# Patient Record
Sex: Female | Born: 1980
Health system: Southern US, Community
[De-identification: ages and names within clinical notes are randomized; demographics above are authoritative.]

## PROBLEM LIST (undated history)

## (undated) DIAGNOSIS — G4730 Sleep apnea, unspecified: Secondary | ICD-10-CM

## (undated) DIAGNOSIS — J069 Acute upper respiratory infection, unspecified: Secondary | ICD-10-CM

## (undated) DIAGNOSIS — M1990 Unspecified osteoarthritis, unspecified site: Secondary | ICD-10-CM

## (undated) DIAGNOSIS — E119 Type 2 diabetes mellitus without complications: Secondary | ICD-10-CM

## (undated) DIAGNOSIS — F329 Major depressive disorder, single episode, unspecified: Secondary | ICD-10-CM

## (undated) DIAGNOSIS — T7840XA Allergy, unspecified, initial encounter: Secondary | ICD-10-CM

## (undated) DIAGNOSIS — N200 Calculus of kidney: Secondary | ICD-10-CM

## (undated) DIAGNOSIS — K219 Gastro-esophageal reflux disease without esophagitis: Secondary | ICD-10-CM

## (undated) DIAGNOSIS — Z87442 Personal history of urinary calculi: Secondary | ICD-10-CM

## (undated) DIAGNOSIS — F909 Attention-deficit hyperactivity disorder, unspecified type: Secondary | ICD-10-CM

## (undated) DIAGNOSIS — D509 Iron deficiency anemia, unspecified: Secondary | ICD-10-CM

## (undated) DIAGNOSIS — D500 Iron deficiency anemia secondary to blood loss (chronic): Principal | ICD-10-CM

## (undated) DIAGNOSIS — J45909 Unspecified asthma, uncomplicated: Secondary | ICD-10-CM

## (undated) DIAGNOSIS — G43909 Migraine, unspecified, not intractable, without status migrainosus: Secondary | ICD-10-CM

## (undated) DIAGNOSIS — F32A Depression, unspecified: Secondary | ICD-10-CM

## (undated) DIAGNOSIS — D069 Carcinoma in situ of cervix, unspecified: Secondary | ICD-10-CM

## (undated) DIAGNOSIS — N938 Other specified abnormal uterine and vaginal bleeding: Secondary | ICD-10-CM

## (undated) DIAGNOSIS — F419 Anxiety disorder, unspecified: Secondary | ICD-10-CM

## (undated) HISTORY — DX: Migraine, unspecified, not intractable, without status migrainosus: G43.909

## (undated) HISTORY — DX: Depression, unspecified: F32.A

## (undated) HISTORY — PX: COLPOSCOPY: SHX161

## (undated) HISTORY — PX: OTHER SURGICAL HISTORY: SHX169

## (undated) HISTORY — DX: Allergy, unspecified, initial encounter: T78.40XA

## (undated) HISTORY — DX: Unspecified asthma, uncomplicated: J45.909

## (undated) HISTORY — DX: Major depressive disorder, single episode, unspecified: F32.9

## (undated) HISTORY — DX: Carcinoma in situ of cervix, unspecified: D06.9

## (undated) HISTORY — DX: Attention-deficit hyperactivity disorder, unspecified type: F90.9

## (undated) HISTORY — DX: Iron deficiency anemia secondary to blood loss (chronic): D50.0

## (undated) HISTORY — PX: MOUTH SURGERY: SHX715

## (undated) HISTORY — DX: Acute upper respiratory infection, unspecified: J06.9

## (undated) HISTORY — DX: Anxiety disorder, unspecified: F41.9

## (undated) HISTORY — DX: Gastro-esophageal reflux disease without esophagitis: K21.9

## (undated) HISTORY — DX: Calculus of kidney: N20.0

## (undated) HISTORY — DX: Type 2 diabetes mellitus without complications: E11.9

## (undated) HISTORY — DX: Iron deficiency anemia, unspecified: D50.9

## (undated) HISTORY — DX: Unspecified osteoarthritis, unspecified site: M19.90

## (undated) HISTORY — DX: Sleep apnea, unspecified: G47.30

## (undated) HISTORY — DX: Other specified abnormal uterine and vaginal bleeding: N93.8

---
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
## 2004-04-14 HISTORY — PX: KNEE ARTHROCENTESIS: SUR44

## 2008-10-20 ENCOUNTER — Ambulatory Visit: Payer: Self-pay | Admitting: Gynecology

## 2008-10-25 ENCOUNTER — Ambulatory Visit: Payer: Self-pay | Admitting: Gynecology

## 2008-10-31 ENCOUNTER — Ambulatory Visit: Payer: Self-pay | Admitting: Gynecology

## 2008-10-31 ENCOUNTER — Encounter: Payer: Self-pay | Admitting: Gynecology

## 2008-11-12 DIAGNOSIS — D069 Carcinoma in situ of cervix, unspecified: Secondary | ICD-10-CM

## 2008-11-12 HISTORY — DX: Carcinoma in situ of cervix, unspecified: D06.9

## 2008-11-12 HISTORY — PX: CERVICAL BIOPSY  W/ LOOP ELECTRODE EXCISION: SUR135

## 2008-11-17 ENCOUNTER — Ambulatory Visit: Payer: Self-pay | Admitting: Gynecology

## 2008-11-21 ENCOUNTER — Ambulatory Visit: Payer: Self-pay | Admitting: Gynecology

## 2008-11-22 ENCOUNTER — Ambulatory Visit: Payer: Self-pay | Admitting: Gynecology

## 2008-11-22 ENCOUNTER — Encounter: Payer: Self-pay | Admitting: Gynecology

## 2008-11-22 ENCOUNTER — Ambulatory Visit (HOSPITAL_BASED_OUTPATIENT_CLINIC_OR_DEPARTMENT_OTHER): Admission: RE | Admit: 2008-11-22 | Discharge: 2008-11-22 | Payer: Self-pay | Admitting: Gynecology

## 2008-12-01 ENCOUNTER — Ambulatory Visit: Payer: Self-pay | Admitting: Gynecology

## 2009-05-24 ENCOUNTER — Other Ambulatory Visit: Admission: RE | Admit: 2009-05-24 | Discharge: 2009-05-24 | Payer: Self-pay | Admitting: Gynecology

## 2009-05-24 ENCOUNTER — Ambulatory Visit: Payer: Self-pay | Admitting: Gynecology

## 2009-06-04 ENCOUNTER — Ambulatory Visit: Payer: Self-pay | Admitting: Gynecology

## 2009-12-26 ENCOUNTER — Other Ambulatory Visit: Admission: RE | Admit: 2009-12-26 | Discharge: 2009-12-26 | Payer: Self-pay | Admitting: Gynecology

## 2009-12-26 ENCOUNTER — Ambulatory Visit: Payer: Self-pay | Admitting: Gynecology

## 2010-08-27 NOTE — H&P (Signed)
[name], [name]             ACCOUNT NO.:  [id number]   MEDICAL RECORD NO.:  [id number]          PATIENT TYPE:  AMB   LOCATION:  [facility]                         FACILITY:  [facility]   PHYSICIAN:  Timothy P. Fontaine, M.D.DATE OF BIRTH:  07-21-1980   DATE OF ADMISSION:  DATE OF DISCHARGE:                              HISTORY & PHYSICAL   DATE OF SURGERY:  August 11, [facility] 8:30 a.m.   CHIEF COMPLAINT:  High-grade cervical dysplasia, dysfunctional bleeding,  endometrial polyp.   HISTORY OF PRESENT ILLNESS:  A 30 year old G zero with history of Pap  smear in [place] showing ascus, cannot rule out high-grade lesion and  presents for C and D.  She had also been on Seasonique and had continued  bleeding on and off over the past several months.  Relates that she has  always had breakthrough bleeding since menarche with long bouts of  amenorrhea and then breakthrough bleeding.  She has been having spotting  between her periods despite being on the [place].  The patient  underwent a colposcopic evaluation with biopsies showing CIN 1 at 12  o'clock, CIN 2 to CIN 3 at 6 o'clock.  She underwent sonohystogram which  overall appeared to be normal with endometrial sampling showing  fragments of benign endometrial polyp pseudo decidual stroma, benign  endocervical mucosa.  The patient is admitted at this time for LEEP  excisional process, hysteroscopy D and C.   PAST MEDICAL HISTORY:  Significant for depression.   PAST SURGICAL HISTORY:  None.   CURRENT MEDICATIONS:  Include Seasonique, Lamictal, Adderall, Lexapro.   ALLERGIES:  No medications.   REVIEW OF SYSTEMS:  Noncontributory.   FAMILY HISTORY:  Noncontributory.   SOCIAL HISTORY:  Noncontributory.   ADMISSION PHYSICAL EXAM:  Afebrile.  VITAL SIGNS:  Stable.  HEENT:  Normal.  LUNGS:  Clear.  CARDIAC:  Regular rate.  No rubs, murmurs or gallops.  ABDOMINAL:  Exam benign.  PELVIC:  External BUS, vagina  normal.  Cervix normal.  Bimanual uterus  normal size, midline, mobile, nontender.  Adnexa without masses or  tenderness.   ASSESSMENT:  A 30 year old G zero oral contraceptives with history of  dysfunctional bleeding on and off.  Outpatient evaluation included a  normal thyroid panel, normal prolactin, sonohystogram overall was normal  though endometrial sampling showed evidence to suggest endometrial  polyp.  She had ascus Pap smear, a few cells suggestive of a higher  lesion.  Colposcopic biopsies to various areas showed one biopsy showing  CIN 1 and the second showing CIN 2 to CIN 3.  Options for management of  both of these issues were reviewed with the patient and her mother.  As  far as cervical dysplasia, I reviewed the options to include expectant  management with hopeful spontaneous regression although possibility of  progression to more significant lesion to include early invasive  carcinoma versus interventional such as LEEP, laser, cone, cryo.  The  risks, benefits of all these options were reviewed with her and she  ultimately has decided to proceed with LEEP.  The patient understands  that this is  virus related, that I am not removing the virus, that she  has the risk of persistent or recurrent disease in the future.  I also  reviewed the various pathology results to include no dysplasia found,  cut through lesions and all dysplasia excised.  The acute risks of LEEP  were reviewed to include bleeding, infection, damage to surrounding  tissues such as vagina, bladder, rectum requiring future reparative  surgeries and again no guarantees as far as eradication of her  dysplasia.  The long-term issues associated with LEEP were also  discussed, in particular the pregnancy issues and the potential for  infertility or problems maintaining a pregnancy, either with incompetent  cervix, preterm delivery or significant preterm delivery or previable  delivery was all discussed with  her.  Again she understands that we are  not eradicating the virus and that she is at risk for persistent  recurrent dysplasia in the future.  I reviewed what is involved with  hysteroscopy, dilation and curettage, use of the hysteroscope,  resectoscope and dilation and curettage.  The risks of the procedure  were reviewed to include the risks of bleeding, transfusion, infection,  uterine perforation, damage to internal organs including bowel, bladder,  ureters, vessels and nerves necessitating major exploratory reparative  surgeries, future reparative surgeries up to and including ostomy  formation.  The risk of distended media absorption leading to metabolic  complications such as coma and seizures were also reviewed, understood  and accepted.  The patient's questions were answered to her  satisfaction.  She has been bleeding on and off throughout the month.  She is due for a period now, has not started yet but had a negative HCG  serum.      Timothy P. Fontaine, M.D.  Electronically Signed     TPF/MEDQ  D:  11/21/2008  T:  11/21/2008  Job:  [id number]

## 2010-08-27 NOTE — Op Note (Signed)
[name], [name]             ACCOUNT NO.:  [id number]   MEDICAL RECORD NO.:  [id number]          PATIENT TYPE:  AMB   LOCATION:  [facility]                         FACILITY:  [facility]   PHYSICIAN:  Timothy P. Fontaine, M.D.DATE OF BIRTH:  1980/12/08   DATE OF PROCEDURE:  11/22/2008  DATE OF DISCHARGE:                               OPERATIVE REPORT   PREOPERATIVE DIAGNOSES:  1. High-grade squamous intraepithelial lesion of the cervix,      colposcopic biopsy-proven.  2. Dysfunctional uterine bleeding.  3. Endometrial polyp.   POSTOPERATIVE DIAGNOSES:  1. High-grade squamous intraepithelial lesion of the cervix,      colposcopic biopsy-proven.  2. Dysfunctional uterine bleeding.  3. Endometrial polyp.   PROCEDURE:  Loop electrical excision procedure (LEEP cervical excisional  biopsy), hysteroscopy, dilatation and curettage.   SURGEON:  Timothy P. Fontaine, M.D.   ANESTHETIC:  General with intracervical lidocaine/epinephrine injection.   SPECIMEN:  1. LEEP cervical excisional biopsy, cut open at 3 o'clock, pinned,      sent to pathology.  2. Endocervical curettings, sent to pathology.  3. Endometrial curetting, sent to pathology.   COMPLICATIONS:  None.   ESTIMATED BLOOD LOSS:  Minimal.   SORBITOL DISCREPANCY:  Minimal.   FINDINGS:  Examination under anesthesia:  External BUS, vagina normal.  Cervix grossly normal.  Bimanual:  Uterus normal size, midline and  mobile.  Adnexa without masses.  Hysteroscopic:  Fundus normal, anterior-  posterior surfaces grossly normal, lower uterine segment with a small  anterior polypoidal area and a small posterior polypoidal area, both in  the lower uterine segment,  both removed with the sharp curetting.  Right and left tubal ostia visualized.  Cavity otherwise was normal.  Endocervical canal normal.   PROCEDURE:  The patient was taken to the operating room, underwent  general anesthesia, was placed in the dorsal lithotomy position,  received a perineal vaginal preparation with Betadine solution, noting  avoiding cervical abrasions but cleansing with Betadine.  Bladder was  emptied with in-and-out Foley catheterization.  EUA was performed and  the patient draped in the usual fashion.  The cervix was visualized with  a LEEP speculum, was circumferentially injected with  lidocaine/epinephrine mixture, a total of 4 mL. The cervix was then  stained with Lugol's solution and the transformation zone, as well as  the abnormal anterior posterior lip areas, were grossly visualized.  Using the 12 x 20 loupe, 60-watt cutting, 60-watt coagulation, blend 1  current, the LEEP was performed, including the visually abnormal areas.  The specimen was cut at 3 o'clock, pinned open and sent to pathology.   An ECC was then performed and the specimen again was sent to pathology.  Prophylactic coagulation was applied to the base with gross hemostasis  noted.   The anterior lip of the cervix was then grasped with a single-tooth  tenaculum, the cervix gently dilated to admit the diagnostic  hysteroscope and hysteroscopy was performed with findings noted above.  A sharp curettage was performed and the specimen was sent to pathology.  The instruments were then removed.  Hemostasis was visualized.  Prophylactic Monsel was applied  to the cervix.  The patient was placed  in the supine position after having received intraoperative Toradol, was  awakened without difficulty and taken to the recovery room in good  condition, having tolerated the procedure well.      Timothy P. Fontaine, M.D.  Electronically Signed     TPF/MEDQ  D:  11/22/2008  T:  11/22/2008  Job:  [id number]

## 2011-04-28 ENCOUNTER — Encounter: Payer: Self-pay | Admitting: Gynecology

## 2011-04-28 ENCOUNTER — Ambulatory Visit (INDEPENDENT_AMBULATORY_CARE_PROVIDER_SITE_OTHER): Payer: 59 | Admitting: Gynecology

## 2011-04-28 ENCOUNTER — Other Ambulatory Visit (HOSPITAL_COMMUNITY)
Admission: RE | Admit: 2011-04-28 | Discharge: 2011-04-28 | Disposition: A | Discharge status: Home / Self Care | Payer: 59 | Source: Ambulatory Visit | Attending: Gynecology | Admitting: Gynecology

## 2011-04-28 VITALS — BP 120/74 | Ht 64.0 in | Wt 171.0 lb

## 2011-04-28 DIAGNOSIS — Z13220 Encounter for screening for lipoid disorders: Secondary | ICD-10-CM

## 2011-04-28 DIAGNOSIS — Z309 Encounter for contraceptive management, unspecified: Secondary | ICD-10-CM

## 2011-04-28 DIAGNOSIS — Z131 Encounter for screening for diabetes mellitus: Secondary | ICD-10-CM

## 2011-04-28 DIAGNOSIS — Z01419 Encounter for gynecological examination (general) (routine) without abnormal findings: Secondary | ICD-10-CM

## 2011-04-28 LAB — URINALYSIS W MICROSCOPIC + REFLEX CULTURE
Bacteria, UA: NEGATIVE
Bilirubin Urine: NEGATIVE
Crystals: NONE SEEN
Ketones, ur: NEGATIVE mg/dL
Nitrite: NEGATIVE
Protein, ur: NEGATIVE mg/dL
Specific Gravity, Urine: 1.02 (ref 1.005–1.030)
Urobilinogen, UA: 0.2 mg/dL (ref 0.0–1.0)

## 2011-04-28 LAB — LIPID PANEL
Cholesterol: 138 mg/dL (ref 0–200)
LDL Cholesterol: 74 mg/dL (ref 0–99)
Total CHOL/HDL Ratio: 3.1 Ratio
Triglycerides: 101 mg/dL (ref <150)
VLDL: 20 mg/dL (ref 0–40)

## 2011-04-28 LAB — CBC WITH DIFFERENTIAL/PLATELET
Eosinophils Relative: 1 % (ref 0–5)
HCT: 36.4 % (ref 36.0–46.0)
Hemoglobin: 12.1 g/dL (ref 12.0–15.0)
Lymphocytes Relative: 32 % (ref 12–46)
MCV: 85.6 fL (ref 78.0–100.0)
Monocytes Absolute: 0.5 10*3/uL (ref 0.1–1.0)
Monocytes Relative: 6 % (ref 3–12)
Neutro Abs: 5.1 10*3/uL (ref 1.7–7.7)
WBC: 8.3 10*3/uL (ref 4.0–10.5)

## 2011-04-28 MED ORDER — LEVONORGEST-ETH ESTRAD 91-DAY 0.15-0.03 MG PO TABS: 1.0000 | ORAL_TABLET | Freq: Every day | ORAL | Status: DC

## 2011-04-28 NOTE — Patient Instructions (Addendum)
Start on birth control pills as we discussed, call if any issues.  Assuming this Pap smear returns normal then recommend repeat Pap smear in 6 months given history of atypia in the past.

## 2011-04-28 NOTE — Progress Notes (Addendum)
[name] January 29, 1981 [id number]        31 y.o.  for annual exam.  Had been on oral contraceptives but ran out earlier this year due to lack of insurance. She had regular menses through December. She then had an early menses in January but notes that she was going through a lot of stress at that time.  Past medical history,surgical history, medications, allergies, family history and social history were all reviewed and documented in the EPIC chart. ROS:  Was performed and pertinent positives and negatives are included in the history.  Exam: Sherri chaperone present Filed Vitals:   04/28/11 1511  BP: 120/74   General appearance  Normal Skin grossly normal Head/Neck normal with no cervical or supraclavicular adenopathy thyroid normal Lungs  clear Cardiac RR, without RMG Abdominal  soft, nontender, without masses, organomegaly or hernia Breasts  examined lying and sitting without masses, retractions, discharge or axillary adenopathy. Pelvic  Ext/BUS/vagina  normal   Cervix  normal  Pap done  Uterus  anteverted, normal size, shape and contour, midline and mobile nontender   Adnexa  Without masses or tenderness    Anus and perineum  normal   Rectovaginal  normal sphincter tone without palpated masses or tenderness.    Assessment/Plan:  31 y.o. female for annual exam.    1. History of significant dysplasia status post LEEP August 2010 showing high-grade squamous dysplasia margins free. Pap done today and I recommended we repeat Pap in 6 months because of her history of significant dysplasia and had only having a few normal Pap smears since then. 2. Contraceptive management. Patient I discussed all options for contraception to include pill patch ring, Depo-Provera, Implanon, IUD. Patient wants to try the extended pills to have less frequent menses I prescribe seasonal x1 year. She'll start after her next menses. She has any issues when starting that she let me know. 3. Irregular menses  x1. She's been going to a lot of stress. She will go ahead and start the birth control pills with her next menses. If she continues accurate or bleeding she'll represent for further evaluation. 4. STD screening. I offered to STD screening and she declined. 5. Health maintenance. SBE monthly reviewed. Stop smoking discussed. Will check baseline CBC lipid profile urinalysis and glucose.    Dara Lords MD, 4:51 PM 04/28/2011

## 2011-04-29 DIAGNOSIS — F32A Depression, unspecified: Secondary | ICD-10-CM | POA: Insufficient documentation

## 2011-04-29 DIAGNOSIS — IMO0002 Reserved for concepts with insufficient information to code with codable children: Secondary | ICD-10-CM | POA: Insufficient documentation

## 2011-04-29 DIAGNOSIS — F329 Major depressive disorder, single episode, unspecified: Secondary | ICD-10-CM | POA: Insufficient documentation

## 2011-05-02 ENCOUNTER — Other Ambulatory Visit: Payer: Self-pay | Admitting: *Deleted

## 2011-05-02 DIAGNOSIS — R718 Other abnormality of red blood cells: Secondary | ICD-10-CM

## 2011-05-05 ENCOUNTER — Other Ambulatory Visit (HOSPITAL_COMMUNITY)
Admission: RE | Admit: 2011-05-05 | Discharge: 2011-05-05 | Disposition: A | Discharge status: Home / Self Care | Payer: 59 | Source: Ambulatory Visit | Attending: Gynecology | Admitting: Gynecology

## 2011-05-05 ENCOUNTER — Ambulatory Visit (INDEPENDENT_AMBULATORY_CARE_PROVIDER_SITE_OTHER): Payer: 59 | Admitting: Gynecology

## 2011-05-05 ENCOUNTER — Encounter: Payer: Self-pay | Admitting: Gynecology

## 2011-05-05 DIAGNOSIS — R87616 Satisfactory cervical smear but lacking transformation zone: Secondary | ICD-10-CM

## 2011-05-05 DIAGNOSIS — Z01419 Encounter for gynecological examination (general) (routine) without abnormal findings: Secondary | ICD-10-CM | POA: Insufficient documentation

## 2011-05-05 DIAGNOSIS — R319 Hematuria, unspecified: Secondary | ICD-10-CM

## 2011-05-05 LAB — URINALYSIS W MICROSCOPIC + REFLEX CULTURE
Casts: NONE SEEN
Crystals: NONE SEEN
Leukocytes, UA: NEGATIVE
Nitrite: NEGATIVE
Specific Gravity, Urine: 1.015 (ref 1.005–1.030)
Urobilinogen, UA: 0.2 mg/dL (ref 0.0–1.0)
pH: 5.5 (ref 5.0–8.0)

## 2011-05-05 NOTE — Patient Instructions (Signed)
Follow up for Pap smear and urinalysis results. Assuming pass were normal then repeat in 6 months.

## 2011-05-05 NOTE — Progress Notes (Signed)
Addended by: Richardson Chiquito on: 05/05/2011 04:38 PM   Modules accepted: Orders

## 2011-05-05 NOTE — Progress Notes (Signed)
Patient presents for Pap smear. If her recent exam we did a Pap smear which was negative but did not show endocervical cells. She also had an abnormal urinalysis but was at the tail end of her menses.  Exam was [name] chaperone present Pelvic external BUS vagina normal. Cervix normal Pap smear done with endocervical mucus noted on the brush.  Assessment and plan: Pap smear repeated today.  Assuming negative will repeat in 6 months given her history of high-grade dysplasia in the past and not having a number of Paps since then.. She repeated her urinalysis today and will follow for these results.

## 2011-07-03 ENCOUNTER — Other Ambulatory Visit: Payer: Self-pay | Admitting: Gynecology

## 2011-07-03 ENCOUNTER — Encounter: Payer: Self-pay | Admitting: Gynecology

## 2011-07-03 ENCOUNTER — Ambulatory Visit (INDEPENDENT_AMBULATORY_CARE_PROVIDER_SITE_OTHER): Payer: 59 | Admitting: Gynecology

## 2011-07-03 DIAGNOSIS — N926 Irregular menstruation, unspecified: Secondary | ICD-10-CM

## 2011-07-03 DIAGNOSIS — R350 Frequency of micturition: Secondary | ICD-10-CM

## 2011-07-03 DIAGNOSIS — R300 Dysuria: Secondary | ICD-10-CM

## 2011-07-03 LAB — URINALYSIS W MICROSCOPIC + REFLEX CULTURE
Bilirubin Urine: NEGATIVE
Crystals: NONE SEEN
Glucose, UA: NEGATIVE mg/dL
Protein, ur: 30 mg/dL — AB
Specific Gravity, Urine: 1.025 (ref 1.005–1.030)
pH: 6 (ref 5.0–8.0)

## 2011-07-03 MED ORDER — SULFAMETHOXAZOLE-TRIMETHOPRIM 800-160 MG PO TABS: 1.0000 | ORAL_TABLET | Freq: Two times a day (BID) | ORAL | Status: AC

## 2011-07-03 NOTE — Patient Instructions (Signed)
Take antibiotics as prescribed. Stop birth control pills and restart in a week. Office visit if irregular bleeding continues.

## 2011-07-03 NOTE — Progress Notes (Signed)
Patient notes over the last 24 hours the onset of frequency and dysuria which seems to be accelerating. No fevers chills nausea vomiting low back pain. Also notes some irregular bleeding. She was on a course of Keflex for a boil in her groin by her primary and started bleeding which has continued of the last 2 weeks.  She is in the second month of continuous pills having planned every 3 months withdrawal.  Exam Spine straight without CVA tenderness Abdomen soft nontender without masses guarding rebound organomegaly  Assessment and plan: 1. UTI. Symptoms and urinalysis consistent with UTI. Will cover with Septra DS one by mouth twice a day x7 days. 2. DUB. I did not do a pelvic today as she is currently bleeding without other pelvic complaints. We'll check hCG. She does use condom backup with intercourse and encouraged her to continue to do so. She can stop her pills today wait a week and then restart a new pack and do an every other to every third month withdrawal. If her irregular bleeding results and will follow if it continues and she'll represent for more involved evaluation.

## 2011-07-06 LAB — URINE CULTURE: Colony Count: >=100000

## 2011-11-24 ENCOUNTER — Other Ambulatory Visit (HOSPITAL_COMMUNITY)
Admission: RE | Admit: 2011-11-24 | Discharge: 2011-11-24 | Disposition: A | Discharge status: Home / Self Care | Payer: 59 | Source: Ambulatory Visit | Attending: Women's Health | Admitting: Women's Health

## 2011-11-24 ENCOUNTER — Encounter: Payer: Self-pay | Admitting: Women's Health

## 2011-11-24 ENCOUNTER — Ambulatory Visit (INDEPENDENT_AMBULATORY_CARE_PROVIDER_SITE_OTHER): Payer: 59 | Admitting: Women's Health

## 2011-11-24 DIAGNOSIS — Z01419 Encounter for gynecological examination (general) (routine) without abnormal findings: Secondary | ICD-10-CM | POA: Insufficient documentation

## 2011-11-24 DIAGNOSIS — Z1151 Encounter for screening for human papillomavirus (HPV): Secondary | ICD-10-CM | POA: Insufficient documentation

## 2011-11-24 DIAGNOSIS — R102 Pelvic and perineal pain: Secondary | ICD-10-CM

## 2011-11-24 DIAGNOSIS — F17200 Nicotine dependence, unspecified, uncomplicated: Secondary | ICD-10-CM

## 2011-11-24 DIAGNOSIS — N898 Other specified noninflammatory disorders of vagina: Secondary | ICD-10-CM

## 2011-11-24 DIAGNOSIS — Z559 Problems related to education and literacy, unspecified: Secondary | ICD-10-CM

## 2011-11-24 DIAGNOSIS — D069 Carcinoma in situ of cervix, unspecified: Secondary | ICD-10-CM

## 2011-11-24 DIAGNOSIS — N949 Unspecified condition associated with female genital organs and menstrual cycle: Secondary | ICD-10-CM

## 2011-11-24 LAB — URINALYSIS W MICROSCOPIC + REFLEX CULTURE
Casts: NONE SEEN
Crystals: NONE SEEN
Ketones, ur: NEGATIVE mg/dL
Leukocytes, UA: NEGATIVE
Nitrite: NEGATIVE
Specific Gravity, Urine: 1.02 (ref 1.005–1.030)
Urobilinogen, UA: 0.2 mg/dL (ref 0.0–1.0)
pH: 8.5 — ABNORMAL HIGH (ref 5.0–8.0)

## 2011-11-24 LAB — WET PREP FOR TRICH, YEAST, CLUE: Clue Cells Wet Prep HPF POC: NONE SEEN

## 2011-11-24 MED ORDER — VARENICLINE TARTRATE 1 MG PO TABS: 1.0000 mg | ORAL_TABLET | Freq: Two times a day (BID) | ORAL | Status: AC

## 2011-11-24 MED ORDER — FLUCONAZOLE 150 MG PO TABS: 150.0000 mg | ORAL_TABLET | Freq: Once | ORAL | Status: AC

## 2011-11-24 NOTE — Progress Notes (Signed)
Patient ID: [name], female   DOB: 04/28/1980, 31 y.o.   MRN: [id number] Presents with complaint of small amount of brown discharge/spotting with itching and for repeat Pap. History of high-grade SIL/CIN II,III - LEEP 2010. Colposcopy 2011 was benign. No Pap in 2012, normal Pap 04/2011. Contraceptives on Seasonale, same partner but has not had a STD screen. Gardasil series completed in 2008. Smoker, requesting help to stop.  Exam: External genitalia within normal limits, speculum exam: Vaginal walls slightly erythematous, cervix pink without visible lesion, repeat Pap and GC/Chlamydia culture taken. Scant amount of brown discharge was noted with no odor. Wet prep positive for yeast. Bimanual no CMT or adnexal fullness or tenderness.  Pap surveillance STD screen Yeast Smoking cessation  Plan: Diflucan 150 by mouth times one dose with refill. GC/Chlamydia culture taken and is pending, will check HIV hepatitis and RPR at annual exam in January. Triage based on Pap results, reviewed new screening recommendations. Reviewed if spotting/brown discharge continues to stop Seasonale for 4 days have cycle and restart. Condoms encouraged until permanent partner. Has used Chantix in the past, reviewed other options to quit. Will try Chantix again, use half tablet if becomes nauseated or has nightmares. States it  worked well in the past with helping to decrease smoking but did have side effects of nausea and bad dreams. Prescription, proper use given.

## 2011-11-24 NOTE — Patient Instructions (Addendum)
Monilial Vaginitis Vaginitis in a soreness, swelling and redness (inflammation) of the vagina and vulva. Monilial vaginitis is not a sexually transmitted infection. CAUSES  Yeast vaginitis is caused by yeast (candida) that is normally found in your vagina. With a yeast infection, the candida has overgrown in number to a point that upsets the chemical balance. SYMPTOMS   White, thick vaginal discharge.   Swelling, itching, redness and irritation of the vagina and possibly the lips of the vagina (vulva).   Burning or painful urination.   Painful intercourse.  DIAGNOSIS  Things that may contribute to monilial vaginitis are:  Postmenopausal and virginal states.   Pregnancy.   Infections.   Being tired, sick or stressed, especially if you had monilial vaginitis in the past.   Diabetes. Good control will help lower the chance.   Birth control pills.   Tight fitting garments.   Using bubble bath, feminine sprays, douches or deodorant tampons.   Taking certain medications that kill germs (antibiotics).   Sporadic recurrence can occur if you become ill.  TREATMENT  Your caregiver will give you medication.  There are several kinds of anti monilial vaginal creams and suppositories specific for monilial vaginitis. For recurrent yeast infections, use a suppository or cream in the vagina 2 times a week, or as directed.   Anti-monilial or steroid cream for the itching or irritation of the vulva may also be used. Get your caregiver's permission.   Painting the vagina with methylene blue solution may help if the monilial cream does not work.   Eating yogurt may help prevent monilial vaginitis.  HOME CARE INSTRUCTIONS   Finish all medication as prescribed.   Do not have sex until treatment is completed or after your caregiver tells you it is okay.   Take warm sitz baths.   Do not douche.   Do not use tampons, especially scented ones.   Wear cotton underwear.   Avoid tight  pants and panty hose.   Tell your sexual partner that you have a yeast infection. They should go to their caregiver if they have symptoms such as mild rash or itching.   Your sexual partner should be treated as well if your infection is difficult to eliminate.   Practice safer sex. Use condoms.   Some vaginal medications cause latex condoms to fail. Vaginal medications that harm condoms are:   Cleocin cream.   Butoconazole (Femstat).   Terconazole (Terazol) vaginal suppository.   Miconazole (Monistat) (may be purchased over the counter).  SEEK MEDICAL CARE IF:   You have a temperature by mouth above 102 F (38.9 C).   The infection is getting worse after 2 days of treatment.   The infection is not getting better after 3 days of treatment.   You develop blisters in or around your vagina.   You develop vaginal bleeding, and it is not your menstrual period.   You have pain when you urinate.   You develop intestinal problems.   You have pain with sexual intercourse.  Document Released: 01/08/2005 Document Revised: 03/20/2011 Document Reviewed: 09/22/2008 Exit[employer] Patient Information 2012 [employer]. 

## 2011-11-25 LAB — GC/CHLAMYDIA PROBE AMP, GENITAL
Chlamydia, DNA Probe: NEGATIVE
GC Probe Amp, Genital: NEGATIVE

## 2011-11-26 ENCOUNTER — Ambulatory Visit: Payer: 59 | Admitting: Gynecology

## 2012-05-21 ENCOUNTER — Encounter: Payer: 59 | Admitting: Gynecology

## 2012-05-28 ENCOUNTER — Encounter: Payer: 59 | Admitting: Gynecology

## 2012-06-03 ENCOUNTER — Ambulatory Visit (INDEPENDENT_AMBULATORY_CARE_PROVIDER_SITE_OTHER): Payer: 59 | Admitting: Gynecology

## 2012-06-03 ENCOUNTER — Other Ambulatory Visit (HOSPITAL_COMMUNITY)
Admission: RE | Admit: 2012-06-03 | Discharge: 2012-06-03 | Disposition: A | Discharge status: Home / Self Care | Payer: 59 | Source: Ambulatory Visit | Attending: Gynecology | Admitting: Gynecology

## 2012-06-03 ENCOUNTER — Encounter: Payer: Self-pay | Admitting: Gynecology

## 2012-06-03 VITALS — BP 120/76 | Ht 64.0 in | Wt 169.0 lb

## 2012-06-03 DIAGNOSIS — R102 Pelvic and perineal pain: Secondary | ICD-10-CM

## 2012-06-03 DIAGNOSIS — Z1151 Encounter for screening for human papillomavirus (HPV): Secondary | ICD-10-CM | POA: Insufficient documentation

## 2012-06-03 DIAGNOSIS — Z01419 Encounter for gynecological examination (general) (routine) without abnormal findings: Secondary | ICD-10-CM

## 2012-06-03 DIAGNOSIS — N938 Other specified abnormal uterine and vaginal bleeding: Secondary | ICD-10-CM

## 2012-06-03 DIAGNOSIS — Z13220 Encounter for screening for lipoid disorders: Secondary | ICD-10-CM

## 2012-06-03 DIAGNOSIS — N949 Unspecified condition associated with female genital organs and menstrual cycle: Secondary | ICD-10-CM

## 2012-06-03 DIAGNOSIS — N925 Other specified irregular menstruation: Secondary | ICD-10-CM

## 2012-06-03 LAB — CBC WITH DIFFERENTIAL/PLATELET
Basophils Absolute: 0 10*3/uL (ref 0.0–0.1)
Eosinophils Relative: 1 % (ref 0–5)
Lymphocytes Relative: 32 % (ref 12–46)
Lymphs Abs: 3.4 10*3/uL (ref 0.7–4.0)
MCV: 83.5 fL (ref 78.0–100.0)
Neutro Abs: 6.6 10*3/uL (ref 1.7–7.7)
Neutrophils Relative %: 63 % (ref 43–77)
Platelets: 484 10*3/uL — ABNORMAL HIGH (ref 150–400)
RBC: 4.24 MIL/uL (ref 3.87–5.11)
RDW: 13.8 % (ref 11.5–15.5)
WBC: 10.6 10*3/uL — ABNORMAL HIGH (ref 4.0–10.5)

## 2012-06-03 LAB — COMPREHENSIVE METABOLIC PANEL
ALT: 13 U/L (ref 0–35)
AST: 16 U/L (ref 0–37)
CO2: 26 mEq/L (ref 19–32)
Calcium: 9.2 mg/dL (ref 8.4–10.5)
Chloride: 102 mEq/L (ref 96–112)
Creat: 0.78 mg/dL (ref 0.50–1.10)
Sodium: 138 mEq/L (ref 135–145)
Total Protein: 7.2 g/dL (ref 6.0–8.3)

## 2012-06-03 LAB — LIPID PANEL
Cholesterol: 166 mg/dL (ref 0–200)
Total CHOL/HDL Ratio: 4 Ratio
VLDL: 20 mg/dL (ref 0–40)

## 2012-06-03 LAB — TSH: TSH: 0.915 u[IU]/mL (ref 0.350–4.500)

## 2012-06-03 LAB — PROLACTIN: Prolactin: 10.7 ng/mL

## 2012-06-03 LAB — HCG, SERUM, QUALITATIVE: Preg, Serum: NEGATIVE

## 2012-06-03 NOTE — Progress Notes (Signed)
[name] 1980-09-11 [id number]        32 y.o.  G0P0 for annual exam.  Several issues noted below.  Past medical history,surgical history, medications, allergies, family history and social history were all reviewed and documented in the EPIC chart. ROS:  Was performed and pertinent positives and negatives are included in the history.  Exam: Kim assistant Filed Vitals:   06/03/12 1603  BP: 120/76  Height: 5\' 4"  (1.626 m)  Weight: 169 lb (76.658 kg)   General appearance  Normal Skin grossly normal Head/Neck normal with no cervical or supraclavicular adenopathy thyroid normal Lungs  clear Cardiac RR, without RMG Abdominal  soft, nontender, without masses, organomegaly or hernia Breasts  examined lying and sitting without masses, retractions, discharge or axillary adenopathy. Pelvic  Ext/BUS/vagina  normal with scant bleeding.  Cervix  normal Pap/HPV, GC/Chlamydia  Uterus  axial, normal size, shape and contour, midline and mobile nontender   Adnexa  Without masses or tenderness    Anus and perineum  normal      Assessment/Plan:  32 y.o. G0P0 female for annual exam.   1. Breakthrough bleeding. Patient is doing it every 3 months withdrawal and overall doing well but notes some spotting intermittently in between. Most recently started bleeding about a week or so ago heavy for 2 days and now down to light spotting. Did not miss any pills. No other symptoms such as weight gain skin or hair changes. No changes in her current medications. We'll start with ultrasound for endometrial assessment. Discuss on his gram versus plain ultrasound. The patient does not do well with pain and I think we'll start with plain ultrasound and then go from there.  Also check baseline FSH prolactin TSH and hCG. 2. Pain. Patient notes suprapubic discomfort. Almost feels right at the pubic bone. No urgency dysuria or frequency. No vaginal symptoms such as discharge odor or itching. Recheck urinalysis and  ultrasound. GC Chlamydia screen done at her acknowledgment. 3. Birth control. Patient will continue on the pills at present. May modify pending the above results. 4. Pap smear. Patient has history of high-grade dysplasia status post LEEP 2010 with clear margins.  Follow up Pap smears 04/2011 and 11/2011 normal. Pap/HPV done today. 5. Breast self. SBE monthly reviewed. 6. Health maintenance. Baseline CBC comprehensive metabolic panel profile ordered with above lab work. Follow up for ultrasound and lab work results.    Dara Lords MD, 4:38 PM 06/03/2012

## 2012-06-03 NOTE — Patient Instructions (Signed)
Follow up for ultrasound as scheduled. We'll discuss your blood work at that time.

## 2012-06-04 LAB — URINALYSIS W MICROSCOPIC + REFLEX CULTURE
Bilirubin Urine: NEGATIVE
Crystals: NONE SEEN
Leukocytes, UA: NEGATIVE
Nitrite: NEGATIVE
Protein, ur: NEGATIVE mg/dL
Specific Gravity, Urine: 1.026 (ref 1.005–1.030)
Squamous Epithelial / LPF: NONE SEEN
Urobilinogen, UA: 1 mg/dL (ref 0.0–1.0)

## 2012-06-04 LAB — GC/CHLAMYDIA PROBE AMP
CT Probe RNA: NEGATIVE
GC Probe RNA: NEGATIVE

## 2012-06-07 ENCOUNTER — Other Ambulatory Visit: Payer: Self-pay | Admitting: Gynecology

## 2012-06-07 DIAGNOSIS — R7989 Other specified abnormal findings of blood chemistry: Secondary | ICD-10-CM

## 2012-06-07 DIAGNOSIS — D72829 Elevated white blood cell count, unspecified: Secondary | ICD-10-CM

## 2012-06-11 ENCOUNTER — Ambulatory Visit (INDEPENDENT_AMBULATORY_CARE_PROVIDER_SITE_OTHER): Payer: 59

## 2012-06-11 ENCOUNTER — Encounter: Payer: Self-pay | Admitting: Gynecology

## 2012-06-11 ENCOUNTER — Ambulatory Visit (INDEPENDENT_AMBULATORY_CARE_PROVIDER_SITE_OTHER): Payer: 59 | Admitting: Gynecology

## 2012-06-11 DIAGNOSIS — D473 Essential (hemorrhagic) thrombocythemia: Secondary | ICD-10-CM

## 2012-06-11 DIAGNOSIS — N949 Unspecified condition associated with female genital organs and menstrual cycle: Secondary | ICD-10-CM

## 2012-06-11 DIAGNOSIS — N938 Other specified abnormal uterine and vaginal bleeding: Secondary | ICD-10-CM

## 2012-06-11 DIAGNOSIS — D75839 Thrombocytosis, unspecified: Secondary | ICD-10-CM

## 2012-06-11 DIAGNOSIS — R7989 Other specified abnormal findings of blood chemistry: Secondary | ICD-10-CM

## 2012-06-11 DIAGNOSIS — R102 Pelvic and perineal pain: Secondary | ICD-10-CM

## 2012-06-11 DIAGNOSIS — D72829 Elevated white blood cell count, unspecified: Secondary | ICD-10-CM

## 2012-06-11 LAB — CBC WITH DIFFERENTIAL/PLATELET
Eosinophils Relative: 1 % (ref 0–5)
Hemoglobin: 12.7 g/dL (ref 12.0–15.0)
Lymphocytes Relative: 26 % (ref 12–46)
Lymphs Abs: 2.3 10*3/uL (ref 0.7–4.0)
MCV: 84 fL (ref 78.0–100.0)
Neutrophils Relative %: 65 % (ref 43–77)
Platelets: 515 10*3/uL — ABNORMAL HIGH (ref 150–400)
RBC: 4.45 MIL/uL (ref 3.87–5.11)
WBC: 8.8 10*3/uL (ref 4.0–10.5)

## 2012-06-11 MED ORDER — NORGESTIMATE-ETH ESTRADIOL 0.25-35 MG-MCG PO TABS: 1.0000 | ORAL_TABLET | Freq: Every day | ORAL | Status: DC

## 2012-06-11 NOTE — Progress Notes (Signed)
Patient presents for ultrasound due to her history of DU B. her hormonal studies returned normal with normal TSH FSH prolactin and negative hCG. GC and Chlamydia screen negative.  Ultrasound shows uterus normal echotexture and size. Endometrial echo 2.9 mm. Right and left ovaries visualized and normal. Cul-de-sac negative.  Assessment and plan: DUB on low-dose oral contraceptive. We'll switch to a higher estrogen dose pill Sprintec. We'll plan this for 6 months to one year and then reassess as far as going back down to a lower dose or maintaining at that level. If she continues to bleed irregularly she is to follow up with me.  On her CBC her white count was marginally elevated at 10.6 and her platelet count at 484,000. Will repeat CBC today. She does tend to run a slightly higher platelet count which is within the same range will plan following.

## 2012-06-11 NOTE — Patient Instructions (Addendum)
Office will contact you with blood results. Call us if irregular bleeding continues on new pill.

## 2012-06-28 ENCOUNTER — Ambulatory Visit (INDEPENDENT_AMBULATORY_CARE_PROVIDER_SITE_OTHER): Payer: 59 | Admitting: Emergency Medicine

## 2012-06-28 ENCOUNTER — Ambulatory Visit: Payer: 59

## 2012-06-28 VITALS — BP 118/82 | HR 117 | Temp 97.9°F | Resp 16 | Ht 64.5 in | Wt 171.0 lb

## 2012-06-28 DIAGNOSIS — R197 Diarrhea, unspecified: Secondary | ICD-10-CM

## 2012-06-28 DIAGNOSIS — D473 Essential (hemorrhagic) thrombocythemia: Secondary | ICD-10-CM

## 2012-06-28 DIAGNOSIS — R062 Wheezing: Secondary | ICD-10-CM

## 2012-06-28 DIAGNOSIS — R05 Cough: Secondary | ICD-10-CM

## 2012-06-28 DIAGNOSIS — J9801 Acute bronchospasm: Secondary | ICD-10-CM

## 2012-06-28 DIAGNOSIS — D75839 Thrombocytosis, unspecified: Secondary | ICD-10-CM

## 2012-06-28 DIAGNOSIS — R059 Cough, unspecified: Secondary | ICD-10-CM

## 2012-06-28 LAB — POCT CBC
MCH, POC: 28.5 pg (ref 27–31.2)
MCV: 88.1 fL (ref 80–97)
MID (cbc): 0.3 (ref 0–0.9)
POC LYMPH PERCENT: 31.7 %L (ref 10–50)
Platelet Count, POC: 543 10*3/uL — AB (ref 142–424)
RDW, POC: 13.6 %
WBC: 9.5 10*3/uL (ref 4.6–10.2)

## 2012-06-28 MED ORDER — FLUTICASONE-SALMETEROL 250-50 MCG/DOSE IN AEPB: 1.0000 | INHALATION_SPRAY | Freq: Two times a day (BID) | RESPIRATORY_TRACT | Status: DC

## 2012-06-28 MED ORDER — AZITHROMYCIN 250 MG PO TABS: ORAL_TABLET | ORAL | Status: DC

## 2012-06-28 MED ORDER — ALBUTEROL SULFATE HFA 108 (90 BASE) MCG/ACT IN AERS: 2.0000 | INHALATION_SPRAY | RESPIRATORY_TRACT | Status: DC | PRN

## 2012-06-28 NOTE — Progress Notes (Signed)
  Subjective:    Patient ID: [name], female    DOB: Jun 13, 1980, 32 y.o.   MRN: [id number]  HPI Pt presents today with illness. She thinks she had the flu on 3/6. She had body aches, fatigue, and had chills and felt warm. She couldn't go to work all last week. Started feeling better on Saturday, but then started getting the feeling of not being able to use the bathroom. Saturday evening she also started a cough, now feels sick and still continuing the diarrhea. She was in [place] on 3/6. She has not been on abx recently. Did not see in blood or mucus in stool. NO odor to stool--denies gassy, bloaty feeling. Also notes that she has thrombocytosis. The platelet levels have gone up. The uterine bleeding has stopped, but went on for about a month. All tests came back normal, so they switched.her OCPs.    Review of Systems     Objective:   Physical Exam HEENT exam is unremarkable. Her neck is supple. When she takes a breath and there is an inspiratory wheeze present diffusely in different lung fields. There no areas of dullness. Her cardiac exam is unremarkable. Abdomen is flat there are no areas of tenderness.  [facility] reading (PRIMARY) by  Dr. Cleta Alberts  There appears to be increased markings in the right middle lobe with an eventration of the diaphragm on the right  Results for orders placed in visit on 06/28/12  POCT CBC      Result Value Range   WBC 9.5  4.6 - 10.2 K/uL   Lymph, poc 3.0  0.6 - 3.4   POC LYMPH PERCENT 31.7  10 - 50 %L   MID (cbc) 0.3  0 - 0.9   POC MID % 3.6  0 - 12 %M   POC Granulocyte 6.1  2 - 6.9   Granulocyte percent 64.7  37 - 80 %G   RBC 4.67  4.04 - 5.48 M/uL   Hemoglobin 13.3  12.2 - 16.2 g/dL   HCT, POC 40.9  81.1 - 47.9 %   MCV 88.1  80 - 97 fL   MCH, POC 28.5  27 - 31.2 pg   MCHC 32.4  31.8 - 35.4 g/dL   RDW, POC 91.4     Platelet Count, POC 543 (*) 142 - 424 K/uL   MPV 7.1  0 - 99.8 fL      Assessment & Plan:  Patient here with what sounds like a  bronchitis with some evidence of bronchospasm. There may be a slight right middle lobe infiltrate. Will treat with a Z-Pak albuterol rescue inhaler as well as Advair to 50-50 one puff twice a day since she will be working in a high risk allergen environment. She also has an elevated platelet count and apparently this has been persistent. Will make referral to Dr. Theron Arista in [place] to evaluate this.

## 2012-07-08 ENCOUNTER — Telehealth: Payer: Self-pay | Admitting: Hematology & Oncology

## 2012-07-08 NOTE — Telephone Encounter (Signed)
Pt aware of 4-16 appointment °

## 2012-07-26 ENCOUNTER — Telehealth: Payer: Self-pay | Admitting: *Deleted

## 2012-07-26 NOTE — Telephone Encounter (Signed)
Pt is calling to follow up  with OV 06/11/12 she is taking Sprintec as directed had cycle 2 weeks ago and bleeding started again on Saturday. Light bleeding with brownish blood, taking daily as directed. Pt also asked me to update you with her most recent CBC results her platelets were elevated at 543 in epic had this done at [facility]. Pt has hematology appt. on Thursday that was recommended by [facility]. She asked if thought follow up with hematology would be necessary? Please advise

## 2012-07-26 NOTE — Telephone Encounter (Signed)
Left the below on pt voicemail, told her to call if questions. 

## 2012-07-26 NOTE — Telephone Encounter (Signed)
As this tends to be a chronic problem with her platelets I think it would be good to get at least 1 hematologist evaluation. I would stay on the new birth control pills several months before we consider making any changes.

## 2012-07-28 ENCOUNTER — Telehealth: Payer: Self-pay

## 2012-07-28 ENCOUNTER — Ambulatory Visit (HOSPITAL_BASED_OUTPATIENT_CLINIC_OR_DEPARTMENT_OTHER): Payer: 59 | Admitting: Hematology & Oncology

## 2012-07-28 ENCOUNTER — Ambulatory Visit: Payer: 59

## 2012-07-28 ENCOUNTER — Other Ambulatory Visit (HOSPITAL_BASED_OUTPATIENT_CLINIC_OR_DEPARTMENT_OTHER): Payer: 59 | Admitting: Lab

## 2012-07-28 VITALS — BP 128/78 | HR 94 | Temp 98.1°F | Resp 16 | Ht 64.0 in | Wt 170.0 lb

## 2012-07-28 DIAGNOSIS — D500 Iron deficiency anemia secondary to blood loss (chronic): Secondary | ICD-10-CM

## 2012-07-28 DIAGNOSIS — D75839 Thrombocytosis, unspecified: Secondary | ICD-10-CM

## 2012-07-28 DIAGNOSIS — D473 Essential (hemorrhagic) thrombocythemia: Secondary | ICD-10-CM

## 2012-07-28 LAB — IRON AND TIBC: TIBC: 483 ug/dL — ABNORMAL HIGH (ref 250–470)

## 2012-07-28 LAB — CBC WITH DIFFERENTIAL (CANCER CENTER ONLY)
BASO#: 0 10*3/uL (ref 0.0–0.2)
BASO%: 0.2 % (ref 0.0–2.0)
EOS%: 0.6 % (ref 0.0–7.0)
HCT: 36.4 % (ref 34.8–46.6)
HGB: 12.4 g/dL (ref 11.6–15.9)
LYMPH#: 2.8 10*3/uL (ref 0.9–3.3)
LYMPH%: 22.2 % (ref 14.0–48.0)
MCH: 28.8 pg (ref 26.0–34.0)
MCHC: 34.1 g/dL (ref 32.0–36.0)
MCV: 85 fL (ref 81–101)
NEUT%: 72.2 % (ref 39.6–80.0)
RDW: 13.4 % (ref 11.1–15.7)

## 2012-07-28 LAB — CHCC SATELLITE - SMEAR

## 2012-07-28 NOTE — Telephone Encounter (Signed)
I would recommend the placebo dose to allow her time to shed any lining left and then she'll start the new poles and hopefully get into rhythm. If she starts jumping around with the pills then she'll continue to bleed on and off.

## 2012-07-28 NOTE — Telephone Encounter (Signed)
Patient informed. Left detailed message in voice mail.

## 2012-07-28 NOTE — Telephone Encounter (Signed)
Patient called earlier this week about BTB on OC.  She started bleeding Saturday and continues today.  She is due to start placebos Saturday. She questioned should she take the placebos since already bleeding and doesn't want to have to bleed another week?

## 2012-07-28 NOTE — Progress Notes (Signed)
This office note has been dictated.

## 2012-07-29 ENCOUNTER — Telehealth: Payer: Self-pay | Admitting: Hematology & Oncology

## 2012-07-29 ENCOUNTER — Encounter: Payer: Self-pay | Admitting: Hematology & Oncology

## 2012-07-29 DIAGNOSIS — D500 Iron deficiency anemia secondary to blood loss (chronic): Secondary | ICD-10-CM

## 2012-07-29 HISTORY — DX: Iron deficiency anemia secondary to blood loss (chronic): D50.0

## 2012-07-29 NOTE — Addendum Note (Signed)
Addended by: Arlan Organ R on: 07/29/2012 02:09 PM   Modules accepted: Orders

## 2012-07-29 NOTE — Telephone Encounter (Signed)
Pt aware of 4-24 and 5-29 appointments

## 2012-07-29 NOTE — Telephone Encounter (Signed)
Left pt message to call and make a couple of appointments one for next week.

## 2012-07-29 NOTE — Progress Notes (Signed)
CC:   Stan Head. Cleta Alberts, M.D. Timothy P. Fontaine, M.D.  DIAGNOSES:  Thrombocytosis.  HISTORY OF PRESENT ILLNESS:  Ms. Brisbane is a very nice 32 year old white female.  She actually is part [place].  Her mom is a Chartered certified accountant down at [employer].  She has been fairly healthy.  She does seem to suffer from a lot of anxiety.  She sees Dr. Cleta Alberts over [facility].  She also sees Dr. Audie Box of GYN.  She apparently has been having some issues with heavy bleeding.  Dr. Audie Box, I think, has been tending to this.  He did switch her to a higher estrogen pill.  She saw Dr. Cleta Alberts back in March.  She was complaining of being sick.  She had "the flu" back in early March.  She had a bout of severe intestinal gastroenteritis.  She apparently had been in [place].  She works for [employer].  She is apparently around people who just are not in the best health.  She had lab work done back in March.  Dr. Cleta Alberts found she had an elevated platelet count of 543,000.  White cell count was 9.5 and hemoglobin was 13.3.  MCV was 88.  She had lab work done back in February which showed a platelet count of 515,000.  Going back to January 2013, her platelet count was 448.  Her white cell count was 8.3 and hemoglobin was 12.1.  Electrolytes done back in February really looked okay.  She did have a chest x-ray back in March.  Chest x-ray was read as unremarkable besides a possibly early infiltrate in the right middle lobe.  She had an ultrasound done in the vaginal area back in February.  This looked pretty much unrevealing.  She is very concerned over this elevated platelet count.  I tried to reassure her as much as I could that this was something that we see all the time.  She says she just has heavy cycles.  She has not had any kind of rashes.  There is burning in the hands or feet.  She has had some occasional headache.  She does have, I think, some  migraines.  There has been no dysphagia or odynophagia.  She recently got back from [place].  She was down there for a couple of weeks.  She had a bad sunburn down there.  Her mom apparently has lupus.  She is worried that she may also have this.  She has not noted any swollen lymph nodes.  There has been no double vision or blurry vision.  There have been no mouth sores.  PAST MEDICAL HISTORY: 1. Remarkable for cervical intraepithelial neoplasia-grade 3. 2. Depression/anxiety. 3. Torn ACL of the, I think, right knee.  ALLERGIES:  None.  MEDICATIONS:  Adderall 20 mg p.o. t.i.d., Klonopin 5 mg p.o. q.a.m., Lexapro 10 mg p.o. daily, Ortho-Cyclen 1 p.o. daily, and albuterol inhaler 2 puffs q.4 hours p.r.n.  SOCIAL HISTORY:  Remarkable for tobacco use.  She probably smokes about a half pack per day.  There is no alcohol use.  FAMILY HISTORY:  Remarkable for her mother who has the lupus.  There are no obvious blood problems in the family that she knows.  REVIEW OF SYSTEMS:  As stated in history of present illness.  No additional findings are noted on a 12-system review.  PHYSICAL EXAMINATION:  General:  This is a well-developed, well- nourished white female in no obvious distress.  Vital signs: Temperature  of 98.1, pulse 94, respiratory rate 16, blood pressure 128/78.  Weight is 170.  Head and neck:  Normocephalic, atraumatic skull.  There are no ocular or oral lesions.  There are no palpable cervical or supraclavicular lymph nodes.  Thyroid is nonpalpable. Lungs:  Clear to percussion and auscultation bilaterally.  There are no rales, wheezes, or rhonchi.  Cardiac:  Regular rate and rhythm with a normal S1 and S2.  There are no murmurs, rubs, or bruits.  Abdomen: Soft with good bowel sounds.  There is no fluid wave.  There is no palpable abdominal mass.  There is no palpable hepatosplenomegaly. Back:  No tenderness over the spine, ribs, or hips.  Extremities:   No clubbing, cyanosis, or edema.  She has good range of motion of her joints.  She has good pulses in her distal extremities.  Skin:  No rashes, ecchymosis, or petechia.  Neurological:  No focal neurological deficits.  She is quite eloquent.  There is a significant degree of anxiety.  LABORATORY STUDIES:  White cell count 12.4, hemoglobin 12.4, hematocrit 36.4, platelet count 513,000.  MCV is 85.  Peripheral smear shows a normochromic, normocytic population of red blood cells.  There are no nucleated red blood cells.  There are no teardrop cells.  I see no schistocytes or spherocytes.  The white blood cells appear minimally elevated in number.  There are no hypersegmented polys.  I see no immature myeloid or lymphoid cells.  There are no blasts.  Her platelets are mildly increased in number.  Platelets are well granulated.  I see a rare large platelet.  IMPRESSION:  Ms. Jamison is a very nice 32 year old white female with mild thrombocytosis.  Her platelet count has been trending upward over the past year.  Of note, she currently is on her monthly cycle.  I tried to reassure her that I did not think that she had an actual bone marrow disorder.  However, we are sending off a JAK2 assay to evaluate this.  Typically, in a 32 year old female, the most likely source of thrombocytosis is iron deficiency.  We will check her iron studies out. Her MCV is not that low, but yet iron deficiency is something that we have to watch out for.  I am also checking a von Willebrand panel on her.  This can be positive in patients who have thrombocythemia.  It is possible that she just may have a reactive thrombocytosis with any "stress" or inflammation that her body is dealing with.  I could not find anything on her physical exam that would suggest an underlying issue.  I highly doubt a bone marrow disorder.  I do not think we have to do a bone marrow biopsy on her.  I spent a good hour and  20 minutes with her.  I reviewed her lab work. I tried to reassure her as much as I could that I did not feel that there was a bone marrow issue.  I did tell her that ultimately we may have to do a bone marrow biopsy, but this would only be a test of last resort.  I will call Ms. Luchsinger when I get the results back from her lab work.  We will plan to get her back depending on her lab work.  If she is iron deficient, then we will give her IV iron.    ______________________________ Josph Macho, M.D. PRE/MEDQ  D:  07/28/2012  T:  07/29/2012  Job:  [id number]

## 2012-07-31 LAB — VON WILLEBRAND PANEL
Coagulation Factor VIII: 92 % (ref 73–140)
Von Willebrand Antigen, Plasma: 78 % (ref 50–217)

## 2012-08-05 ENCOUNTER — Ambulatory Visit: Payer: 59

## 2012-08-05 ENCOUNTER — Ambulatory Visit (HOSPITAL_BASED_OUTPATIENT_CLINIC_OR_DEPARTMENT_OTHER): Payer: 59

## 2012-08-05 VITALS — BP 143/94 | HR 100

## 2012-08-05 DIAGNOSIS — D500 Iron deficiency anemia secondary to blood loss (chronic): Secondary | ICD-10-CM

## 2012-08-05 MED ORDER — SODIUM CHLORIDE 0.9 % IV SOLN
1020.0000 mg | Freq: Once | INTRAVENOUS | Status: AC
  Administered 2012-08-05: 1020 mg via INTRAVENOUS
  Filled 2012-08-05: qty 34

## 2012-08-05 MED ORDER — SODIUM CHLORIDE 0.9 % IV SOLN
Freq: Once | INTRAVENOUS | Status: AC
  Administered 2012-08-05: 15:00:00 via INTRAVENOUS

## 2012-08-05 NOTE — Patient Instructions (Addendum)
Ferumoxytol injection What is this medicine? FERUMOXYTOL is an iron complex. Iron is used to make healthy red blood cells, which carry oxygen and nutrients throughout the body. This medicine is used to treat iron deficiency anemia in people with chronic kidney disease. This medicine may be used for other purposes; ask your health care provider or pharmacist if you have questions. What should I tell my health care provider before I take this medicine? They need to know if you have any of these conditions: -anemia not caused by low iron levels -high levels of iron in the blood -magnetic resonance imaging (MRI) test scheduled -an unusual or allergic reaction to iron, other medicines, foods, dyes, or preservatives -pregnant or trying to get pregnant -breast-feeding How should I use this medicine? This medicine is for infusion into a vein. It is given by a health care professional in a hospital or clinic setting. Talk to your pediatrician regarding the use of this medicine in children. Special care may be needed. Overdosage: If you think you've taken too much of this medicine contact a poison control center or emergency room at once. Overdosage: If you think you have taken too much of this medicine contact a poison control center or emergency room at once. NOTE: This medicine is only for you. Do not share this medicine with others. What if I miss a dose? It is important not to miss your dose. Call your doctor or health care professional if you are unable to keep an appointment. What may interact with this medicine? This medicine may interact with the following medications: -other iron products This list may not describe all possible interactions. Give your health care provider a list of all the medicines, herbs, non-prescription drugs, or dietary supplements you use. Also tell them if you smoke, drink alcohol, or use illegal drugs. Some items may interact with your medicine. What should I watch  for while using this medicine? Visit your doctor or healthcare professional regularly. Tell your doctor or healthcare professional if your symptoms do not start to get better or if they get worse. You may need blood work done while you are taking this medicine. You may need to follow a special diet. Talk to your doctor. Foods that contain iron include: whole grains/cereals, dried fruits, beans, or peas, leafy green vegetables, and organ meats (liver, kidney). What side effects may I notice from receiving this medicine? Side effects that you should report to your doctor or health care professional as soon as possible: -allergic reactions like skin rash, itching or hives, swelling of the face, lips, or tongue -breathing problems -changes in blood pressure -feeling faint or lightheaded, falls -fever or chills -flushing, sweating, or hot feelings -swelling of the ankles or feet Side effects that usually do not require medical attention (Report these to your doctor or health care professional if they continue or are bothersome.): -diarrhea -headache -nausea, vomiting -stomach pain This list may not describe all possible side effects. Call your doctor for medical advice about side effects. You may report side effects to FDA at 1-800-FDA-1088. Where should I keep my medicine? This drug is given in a hospital or clinic and will not be stored at home. NOTE: This sheet is a summary. It may not cover all possible information. If you have questions about this medicine, talk to your doctor, pharmacist, or health care provider.  2012, Elsevier/Gold Standard. (12/22/2007 9:48:25 PM) 

## 2012-09-09 ENCOUNTER — Ambulatory Visit (HOSPITAL_BASED_OUTPATIENT_CLINIC_OR_DEPARTMENT_OTHER): Payer: 59 | Admitting: Hematology & Oncology

## 2012-09-09 ENCOUNTER — Other Ambulatory Visit (HOSPITAL_BASED_OUTPATIENT_CLINIC_OR_DEPARTMENT_OTHER): Payer: 59 | Admitting: Lab

## 2012-09-09 VITALS — BP 111/70 | HR 85 | Temp 98.1°F | Resp 16 | Ht 64.0 in | Wt 164.0 lb

## 2012-09-09 DIAGNOSIS — D473 Essential (hemorrhagic) thrombocythemia: Secondary | ICD-10-CM

## 2012-09-09 DIAGNOSIS — N921 Excessive and frequent menstruation with irregular cycle: Secondary | ICD-10-CM

## 2012-09-09 DIAGNOSIS — D509 Iron deficiency anemia, unspecified: Secondary | ICD-10-CM

## 2012-09-09 DIAGNOSIS — N920 Excessive and frequent menstruation with regular cycle: Secondary | ICD-10-CM

## 2012-09-09 DIAGNOSIS — R197 Diarrhea, unspecified: Secondary | ICD-10-CM

## 2012-09-09 DIAGNOSIS — D75839 Thrombocytosis, unspecified: Secondary | ICD-10-CM

## 2012-09-09 DIAGNOSIS — D500 Iron deficiency anemia secondary to blood loss (chronic): Secondary | ICD-10-CM

## 2012-09-09 LAB — CBC WITH DIFFERENTIAL (CANCER CENTER ONLY)
BASO#: 0 10*3/uL (ref 0.0–0.2)
Eosinophils Absolute: 0.1 10*3/uL (ref 0.0–0.5)
HCT: 38.6 % (ref 34.8–46.6)
LYMPH%: 26.5 % (ref 14.0–48.0)
MCH: 29.2 pg (ref 26.0–34.0)
MCV: 87 fL (ref 81–101)
MONO%: 4.3 % (ref 0.0–13.0)
NEUT%: 68.4 % (ref 39.6–80.0)
Platelets: 430 10*3/uL — ABNORMAL HIGH (ref 145–400)
RBC: 4.42 10*6/uL (ref 3.70–5.32)

## 2012-09-09 LAB — IRON AND TIBC: %SAT: 33 % (ref 20–55)

## 2012-09-09 LAB — FERRITIN: Ferritin: 258 ng/mL (ref 10–291)

## 2012-09-09 NOTE — Progress Notes (Signed)
This office note has been dictated.

## 2012-09-10 ENCOUNTER — Telehealth: Payer: Self-pay | Admitting: *Deleted

## 2012-09-10 NOTE — Telephone Encounter (Signed)
Called patient to let her know that her iron levels are much better per dr. Myna Hidalgo  Does not need iron infusion.  Left message on personal cell phone

## 2012-09-10 NOTE — Progress Notes (Signed)
CC:   Stan Head. Cleta Alberts, M.D. Timothy P. Fontaine, M.D. Bernette Redbird, M.D.  DIAGNOSES: 1. Iron-deficiency anemia. 2. Thrombocytosis-reactive to #1. 3. Menorrhagia.  CURRENT THERAPY:  Patient status post IV iron.  INTERIM HISTORY:  [name] comes in for followup.  We initially saw back in mid April.  At that point in time, we did iron studies on her.  Her ferritin was only 16 with an iron saturation of 14%.  We gave her a dose of Feraheme at 1020 mg.  She got this on April 24th.  She tolerated this well.  She is doing okay.  She felt better after she got the iron.  She now feels a little bit more tired.  The patient is having her monthly cycles every couple weeks.  She is being followed by her gynecologist.  He is changing her contraceptives around.  When I do see her back, I am going to check her for von Willebrand to make sure that this might not be the cause for the frequent cycles.  Otherwise, she has been doing okay.  PHYSICAL EXAMINATION:  General:  This is a well-developed, well- nourished white female in no obvious distress.  Vital signs: Temperature of 98.1, pulse 85, respiratory rate 16, blood pressure 111/70.  Weight is 164  pounds.  Head and neck:  Normocephalic, atraumatic skull.  There are no ocular or oral lesions.  There are no palpable cervical or supraclavicular lymph nodes.  Lungs:  Clear bilaterally.  Cardiac:  Regular rate and rhythm with a normal S1 and S2. There are no murmurs, rubs, or bruits.  Abdomen:  Soft with good bowel sounds.  There is some tenderness in the epigastric region.  There is no guarding or rebound.  There is no fluid wave.  There is no palpable hepatosplenomegaly.  Extremities:  No clubbing, cyanosis, or edema. Neurological:  No focal neurological deficits.  LABORATORY STUDIES:  White cell count 9.3, hemoglobin 12.9, hematocrit 38.6, platelet count 430.  MCV is 87.  IMPRESSION:  Ms. Sulton is a 32 year old white female with  iron deficiency.  Her platelet counts responded as I expected.  I think that she will always have some degree of thrombocytosis as long as she does have the frequent cycles.  Also, her medications that she is taking could certainly lead to some thrombocytosis.  We will see what her iron studies are.  It is possible that she may need to have another dose of iron if she is losing blood, as she is.  I am going to refer her to a gastroenterologist.  She is having a lot of gastrointestinal issue she says.  She is having diarrhea.  She is having some nausea.  She does feel like she cannot digest food all that well.  I will see Ms. Lege back in another couple of months.  We can certainly get her in sooner if we do find that her iron is lower.    ______________________________ Josph Macho, M.D. PRE/MEDQ  D:  09/09/2012  T:  09/10/2012  Job:  [id number]

## 2012-09-10 NOTE — Telephone Encounter (Signed)
Message copied by Anselm Jungling on Fri Sep 10, 2012 10:50 AM ------      Message from: [name]      Created: Fri Sep 10, 2012  7:31 AM       Call - iron is much better! You do not need another dose right now.  Hopefully, GI can help with abdominal issues.  Pete ------

## 2012-11-09 ENCOUNTER — Telehealth: Payer: Self-pay | Admitting: Hematology & Oncology

## 2012-11-09 NOTE — Telephone Encounter (Signed)
Patient called and cx 11/10/12 apt and resch for 12/15/12

## 2012-11-10 ENCOUNTER — Ambulatory Visit: Payer: 59 | Admitting: Hematology & Oncology

## 2012-11-10 ENCOUNTER — Other Ambulatory Visit: Payer: 59 | Admitting: Lab

## 2012-12-15 ENCOUNTER — Other Ambulatory Visit (HOSPITAL_BASED_OUTPATIENT_CLINIC_OR_DEPARTMENT_OTHER): Payer: 59 | Admitting: Lab

## 2012-12-15 ENCOUNTER — Ambulatory Visit (HOSPITAL_BASED_OUTPATIENT_CLINIC_OR_DEPARTMENT_OTHER): Payer: 59 | Admitting: Hematology & Oncology

## 2012-12-15 DIAGNOSIS — D508 Other iron deficiency anemias: Secondary | ICD-10-CM

## 2012-12-15 DIAGNOSIS — D500 Iron deficiency anemia secondary to blood loss (chronic): Secondary | ICD-10-CM

## 2012-12-15 DIAGNOSIS — R5381 Other malaise: Secondary | ICD-10-CM

## 2012-12-15 DIAGNOSIS — N921 Excessive and frequent menstruation with irregular cycle: Secondary | ICD-10-CM

## 2012-12-15 LAB — CBC WITH DIFFERENTIAL (CANCER CENTER ONLY)
BASO%: 0.2 % (ref 0.0–2.0)
EOS%: 0.8 % (ref 0.0–7.0)
HCT: 37.1 % (ref 34.8–46.6)
LYMPH#: 2.5 10*3/uL (ref 0.9–3.3)
LYMPH%: 20.3 % (ref 14.0–48.0)
MCHC: 33.7 g/dL (ref 32.0–36.0)
NEUT%: 73.2 % (ref 39.6–80.0)
Platelets: 475 10*3/uL — ABNORMAL HIGH (ref 145–400)
RDW: 12.6 % (ref 11.1–15.7)

## 2012-12-15 LAB — CHCC SATELLITE - SMEAR

## 2012-12-15 NOTE — Progress Notes (Signed)
This office note has been dictated.

## 2012-12-16 LAB — IRON AND TIBC CHCC: %SAT: 23 % (ref 21–57)

## 2012-12-16 NOTE — Progress Notes (Signed)
DIAGNOSIS:  Iron-deficiency anemia.  CURRENT THERAPY:  IV iron as indicated.  INTERIM HISTORY:  [name] comes in for followup.  We last saw back in late May.  She had responded well to the iron.  She got Feraheme a dose of 1020 mg.  When we last saw her, her ferritin was 258 with an iron saturation of 33%.  Her problem now is that she has sustained a large abrasion on her left lower leg.  Thankfully, this is not infected.  She is doing a good job trying to keep it nice and clean and dry.  She does feel a little bit tired.  However, she feels better than when we last saw her.  We did refer her to Dr. Matthias Hughs of GI.  He helped to take care of her diarrhea and abdominal pain.  She is on a probiotic.  He did have her on a proton pump inhibitor.  She has had no cough.  There have been no sweats.  PHYSICAL EXAMINATION:  General:  This is a well-developed, well- nourished white female in no obvious distress.  Vital signs:  Show a temperature of 97.8, pulse 78, respiratory rate 16, blood pressure 126/87.  Weight is 177.  Head and neck:  Show a normocephalic, atraumatic skull.  There are no ocular or oral lesions.  There are no palpable cervical or supraclavicular lymph nodes.  Lungs:  Clear bilaterally.  Cardiac:  Regular rate and rhythm, with a normal S1 and S2.  There are no murmurs, rubs, or bruits.  Abdomen:  Soft.  She has good bowel sounds.  There is no fluid wave.  There is no palpable hepatosplenomegaly.  Extremities:  Show the abrasion on the lateral left lower leg.  There is no exudate.  Otherwise, her extremities are unremarkable.  LABORATORY STUDIES:  White cell count is 12.5, hemoglobin 12.5, hematocrit 37.1, platelet count 475.  MCV is 88.  Von Willebrand antigen is 78.  Factor VIII level is 92.  Ristocetin cofactor is 56.  IMPRESSION:  [name] is a very charming 32 year old white female with history of iron deficiency.  She had leukocytosis  and thrombocytosis.  Her white cell and platelet count are up a little bit. I looked at her blood smear.  I felt that the blood smear is reactive. I suspect that the changes are from this abrasion on her left leg.  I did check her for von Willebrand disease because of her heavy monthly cycles.  The von Willebrand is normal.  We also checked her for a JAK2 mutation.  This was also negative.  We will see what her iron studies show.  It is possible that she will need another dose of IV iron, at some point.  From my point of view, I do not think we have to get her back to the office, unless she feels that she needs [place].  I will be more than happy to see her back at any time.    ______________________________ Josph Macho, M.D. PRE/MEDQ  D:  12/15/2012  T:  12/16/2012  Job:  [id number]

## 2012-12-19 LAB — VON WILLEBRAND PANEL
Coagulation Factor VIII: 112 % (ref 73–140)
Ristocetin Co-factor, Plasma: 54 % (ref 42–200)

## 2012-12-24 ENCOUNTER — Other Ambulatory Visit: Payer: Self-pay | Admitting: Gynecology

## 2012-12-27 NOTE — Progress Notes (Signed)
Patient call  and left message regarding her needing iron to call us back to get it set up.

## 2013-01-04 ENCOUNTER — Telehealth: Payer: Self-pay | Admitting: *Deleted

## 2013-01-04 NOTE — Telephone Encounter (Addendum)
Message copied by Wynonia Hazard on Tue Jan 04, 2013  5:03 PM ------      Message from: Josph Macho      Created: Sun Dec 19, 2012  8:09 PM       Call - iron is dropping.  We probably need to give you a dose of Feraheme just to prevent the blood count from going down!!  We can do this at your convenience.  Pete ------  .Called Yassmin Scruton and left message on voicemail stating that Dr Myna Hidalgo recommends a dose of FeraHeme. Asked that she please call the office to let us know if she is interested in receiving it. Gave reasoning per Dr Gustavo Lah above message.   Rhemi Balbach, Idell Pickles, RN

## 2013-01-05 ENCOUNTER — Telehealth: Payer: Self-pay | Admitting: Hematology & Oncology

## 2013-01-05 NOTE — Telephone Encounter (Signed)
Pt made 9-25 iron appointment

## 2013-01-05 NOTE — Telephone Encounter (Signed)
Left pt message to call for iron appointment °

## 2013-01-06 ENCOUNTER — Ambulatory Visit (HOSPITAL_BASED_OUTPATIENT_CLINIC_OR_DEPARTMENT_OTHER): Payer: 59

## 2013-01-06 VITALS — BP 126/81 | HR 103 | Temp 97.8°F | Resp 20

## 2013-01-06 DIAGNOSIS — D509 Iron deficiency anemia, unspecified: Secondary | ICD-10-CM

## 2013-01-06 MED ORDER — SODIUM CHLORIDE 0.9 % IV SOLN
Freq: Once | INTRAVENOUS | Status: AC
  Administered 2013-01-06: 11:00:00 via INTRAVENOUS

## 2013-01-06 MED ORDER — SODIUM CHLORIDE 0.9 % IV SOLN
1020.0000 mg | Freq: Once | INTRAVENOUS | Status: AC
  Administered 2013-01-06: 1020 mg via INTRAVENOUS
  Filled 2013-01-06: qty 34

## 2013-01-06 NOTE — Patient Instructions (Addendum)
Ferumoxytol injection What is this medicine? FERUMOXYTOL is an iron complex. Iron is used to make healthy red blood cells, which carry oxygen and nutrients throughout the body. This medicine is used to treat iron deficiency anemia in people with chronic kidney disease. This medicine may be used for other purposes; ask your health care provider or pharmacist if you have questions. What should I tell my health care provider before I take this medicine? They need to know if you have any of these conditions: -anemia not caused by low iron levels -high levels of iron in the blood -magnetic resonance imaging (MRI) test scheduled -an unusual or allergic reaction to iron, other medicines, foods, dyes, or preservatives -pregnant or trying to get pregnant -breast-feeding How should I use this medicine? This medicine is for infusion into a vein. It is given by a health care professional in a hospital or clinic setting. Talk to your pediatrician regarding the use of this medicine in children. Special care may be needed. Overdosage: If you think you've taken too much of this medicine contact a poison control center or emergency room at once. Overdosage: If you think you have taken too much of this medicine contact a poison control center or emergency room at once. NOTE: This medicine is only for you. Do not share this medicine with others. What if I miss a dose? It is important not to miss your dose. Call your doctor or health care professional if you are unable to keep an appointment. What may interact with this medicine? This medicine may interact with the following medications: -other iron products This list may not describe all possible interactions. Give your health care provider a list of all the medicines, herbs, non-prescription drugs, or dietary supplements you use. Also tell them if you smoke, drink alcohol, or use illegal drugs. Some items may interact with your medicine. What should I watch  for while using this medicine? Visit your doctor or healthcare professional regularly. Tell your doctor or healthcare professional if your symptoms do not start to get better or if they get worse. You may need blood work done while you are taking this medicine. You may need to follow a special diet. Talk to your doctor. Foods that contain iron include: whole grains/cereals, dried fruits, beans, or peas, leafy green vegetables, and organ meats (liver, kidney). What side effects may I notice from receiving this medicine? Side effects that you should report to your doctor or health care professional as soon as possible: -allergic reactions like skin rash, itching or hives, swelling of the face, lips, or tongue -breathing problems -changes in blood pressure -feeling faint or lightheaded, falls -fever or chills -flushing, sweating, or hot feelings -swelling of the ankles or feet Side effects that usually do not require medical attention (Report these to your doctor or health care professional if they continue or are bothersome.): -diarrhea -headache -nausea, vomiting -stomach pain This list may not describe all possible side effects. Call your doctor for medical advice about side effects. You may report side effects to FDA at 1-800-FDA-1088. Where should I keep my medicine? This drug is given in a hospital or clinic and will not be stored at home. NOTE: This sheet is a summary. It may not cover all possible information. If you have questions about this medicine, talk to your doctor, pharmacist, or health care provider.  2013, [employer]. (12/22/2007 9:48:25 PM)  

## 2013-01-07 ENCOUNTER — Telehealth: Payer: Self-pay | Admitting: Hematology & Oncology

## 2013-01-07 NOTE — Telephone Encounter (Signed)
Pt left message if she needed follow up. Per Dr. Myna Hidalgo I left message that MD said to call if needed.

## 2013-01-11 ENCOUNTER — Telehealth: Payer: Self-pay | Admitting: Hematology & Oncology

## 2013-01-11 NOTE — Telephone Encounter (Signed)
Pt sent a fax with her signed release of medical records form and requested medical records be faxed to:  [facility] [address] [place], [address] [place], [place]  [address] P: [phone] F: [phone]  Medical records was faxed to them today.  COPY SCANNED .

## 2013-02-12 ENCOUNTER — Ambulatory Visit (INDEPENDENT_AMBULATORY_CARE_PROVIDER_SITE_OTHER): Payer: 59 | Admitting: Emergency Medicine

## 2013-02-12 VITALS — BP 116/72 | HR 85 | Temp 98.2°F | Resp 16 | Ht 66.5 in | Wt 178.8 lb

## 2013-02-12 DIAGNOSIS — J0100 Acute maxillary sinusitis, unspecified: Secondary | ICD-10-CM

## 2013-02-12 DIAGNOSIS — J209 Acute bronchitis, unspecified: Secondary | ICD-10-CM

## 2013-02-12 DIAGNOSIS — J018 Other acute sinusitis: Secondary | ICD-10-CM

## 2013-02-12 MED ORDER — PSEUDOEPHEDRINE-GUAIFENESIN ER 60-600 MG PO TB12: 1.0000 | ORAL_TABLET | Freq: Two times a day (BID) | ORAL | Status: DC

## 2013-02-12 MED ORDER — HYDROCOD POLST-CHLORPHEN POLST 10-8 MG/5ML PO LQCR: 5.0000 mL | Freq: Two times a day (BID) | ORAL | Status: DC | PRN

## 2013-02-12 MED ORDER — AMOXICILLIN-POT CLAVULANATE 875-125 MG PO TABS: 1.0000 | ORAL_TABLET | Freq: Two times a day (BID) | ORAL | Status: DC

## 2013-02-12 MED ORDER — ALBUTEROL SULFATE HFA 108 (90 BASE) MCG/ACT IN AERS: 2.0000 | INHALATION_SPRAY | RESPIRATORY_TRACT | Status: DC | PRN

## 2013-02-12 NOTE — Patient Instructions (Signed)

## 2013-02-12 NOTE — Progress Notes (Signed)
[facility] [address], [place] [address] [phone]  Date:  02/12/2013   Name:  [name]   DOB:  06-02-1980   MRN:  [id number]  PCP:  Astrid Divine, MD    Chief Complaint: Cough and Nasal Congestion   History of Present Illness:  [name] is a 32 y.o. very pleasant female patient who presents with the following:  Ill with a cough.  Has nasal congestion and purulent nasal drainage.  No post nasal drip.  Cough is dry and non productive.  No associated wheezing or shortness of breath.  No fever or chills. No nausea or vomiting. No stool change or rash.  No improvement with over the counter medications or other home remedies. Denies other complaint or health concern today. Sick contacts daily.  Ill since Wednesday.    Patient Active Problem List   Diagnosis Date Noted  . Iron deficiency anemia secondary to blood loss (chronic) 07/29/2012  . Depression     Past Medical History  Diagnosis Date  . Depression   . CIN III (cervical intraepithelial neoplasia grade III) with severe dysplasia 11/2008    LEEP-D&C-HYSTEROSCOPY-SHOWED CIN II AND CIN III  . Allergy   . Iron deficiency anemia secondary to blood loss (chronic) 07/29/2012    Past Surgical History  Procedure Laterality Date  . Cervical biopsy  w/ loop electrode excision  11/2008    CIN 3 free margins  . Anterior cruciate ligament repair  1998    LEFT  . Pilonidal cyst excision  1999  . Knee arthrocentesis  2006  . Colposcopy      History  Substance Use Topics  . Smoking status: Current Every Day Smoker -- 0.50 packs/day    Types: Cigarettes  . Smokeless tobacco: Never Used  . Alcohol Use: No    Family History  Problem Relation Age of Onset  . Diabetes Father   . Hypertension Father   . Hyperlipidemia Father   . Lupus Mother     No Known Allergies  Medication list has been reviewed and updated.  Current Outpatient Prescriptions on File Prior to Visit   Medication Sig Dispense Refill  . clonazePAM (KLONOPIN) 0.5 MG tablet Take 0.5 mg by mouth as needed for anxiety. TAKES 0.25      . escitalopram (LEXAPRO) 10 MG tablet Take 10 mg by mouth daily.      . ORTHO-CYCLEN, 28, 0.25-35 MG-MCG tablet take 1 tablet by mouth once daily  28 tablet  6  . albuterol (PROVENTIL HFA;VENTOLIN HFA) 108 (90 BASE) MCG/ACT inhaler Inhale 2 puffs into the lungs as needed for wheezing (cough, shortness of breath or wheezing.).      Marland Kitchen Amphetamine-Dextroamphetamine (ADDERALL PO) Take 20 mg by mouth 3 (three) times daily.        No current facility-administered medications on file prior to visit.    Review of Systems:  As per HPI, otherwise negative.    Physical Examination: Filed Vitals:   02/12/13 1557  BP: 116/72  Pulse: 85  Temp: 98.2 F (36.8 C)  Resp: 16   Filed Vitals:   02/12/13 1557  Height: 5' 6.5" (1.689 m)  Weight: 178 lb 12.8 oz (81.103 kg)   Body mass index is 28.43 kg/(m^2). Ideal Body Weight: Weight in (lb) to have BMI = 25: 156.9  GEN: WDWN, NAD, Non-toxic, A & O x 3 HEENT: Atraumatic, Normocephalic. Neck supple. No masses, No LAD. Ears and Nose: No external deformity. CV: RRR,  No M/G/R. No JVD. No thrill. No extra heart sounds. PULM: CTA B, no wheezes, crackles, rhonchi. No retractions. No resp. distress. No accessory muscle use. ABD: S, NT, ND, +BS. No rebound. No HSM. EXTR: No c/c/e NEURO Normal gait.  PSYCH: Normally interactive. Conversant. Not depressed or anxious appearing.  Calm demeanor.    Assessment and Plan: Sinusitis Bronchitis augmentin mucinex d tussionex   Signed,  Phillips Odor, MD

## 2013-02-12 NOTE — Addendum Note (Signed)
Addended by: Carmelina Dane on: 02/12/2013 04:36 PM   Modules accepted: Orders

## 2013-02-16 ENCOUNTER — Encounter: Payer: Self-pay | Admitting: Family Medicine

## 2013-06-22 ENCOUNTER — Encounter: Payer: Self-pay | Admitting: Gynecology

## 2013-06-22 ENCOUNTER — Other Ambulatory Visit (HOSPITAL_COMMUNITY)
Admission: RE | Admit: 2013-06-22 | Discharge: 2013-06-22 | Disposition: A | Discharge status: Home / Self Care | Payer: 59 | Source: Ambulatory Visit | Attending: Gynecology | Admitting: Gynecology

## 2013-06-22 ENCOUNTER — Ambulatory Visit (INDEPENDENT_AMBULATORY_CARE_PROVIDER_SITE_OTHER): Payer: 59 | Admitting: Gynecology

## 2013-06-22 VITALS — BP 110/70 | Ht 64.0 in | Wt 183.0 lb

## 2013-06-22 DIAGNOSIS — D509 Iron deficiency anemia, unspecified: Secondary | ICD-10-CM

## 2013-06-22 DIAGNOSIS — Z01419 Encounter for gynecological examination (general) (routine) without abnormal findings: Secondary | ICD-10-CM

## 2013-06-22 DIAGNOSIS — Z1151 Encounter for screening for human papillomavirus (HPV): Secondary | ICD-10-CM | POA: Insufficient documentation

## 2013-06-22 MED ORDER — NORGESTIMATE-ETH ESTRADIOL 0.25-35 MG-MCG PO TABS: ORAL_TABLET | ORAL | Status: DC

## 2013-06-22 NOTE — Progress Notes (Signed)
Jaquetta Ketner [birth year] [id number]        33 y.o.  G0P0 for annual exam.  Several issues noted below.  Past medical history,surgical history, problem list, medications, allergies, family history and social history were all reviewed and documented in the EPIC chart.  ROS:  Performed and pertinent positives and negatives are included in the history, assessment and plan .  Exam: Kim assistant Filed Vitals:   06/22/13 1559  BP: 110/70  Height: 5\' 4"  (1.626 m)  Weight: 183 lb (83.008 kg)   General appearance  Normal Skin grossly normal Head/Neck normal with no cervical or supraclavicular adenopathy thyroid normal Lungs  clear Cardiac RR, without RMG Abdominal  soft, nontender, without masses, organomegaly or hernia Breasts  examined lying and sitting without masses, retractions, discharge or axillary adenopathy. Pelvic  Ext/BUS/vagina normal  Cervix Pap/HPV done  Uterus anteverted, normal size, shape and contour, midline and mobile nontender   Adnexa  Without masses or tenderness    Anus and perineum  Normal       Assessment/Plan:  33 y.o. G0P0 female for annual exam regular menses, oral contraceptives.   1. Fatigue. Patient notes feeling tired most recently. No weight loss weight gain hair or skin changes. Had been seen by Dr. Marin Olp for iron deficiency anemia requiring IV infusions. We'll check baseline CBC iron level TIBC comprehensive metabolic panel TSH. 2. Contraceptive management. Patient on oral contraceptives with regular menses. I reviewed the risks to include increased risk of stroke heart attack DVT particularly with cigarette smoking in the 30s. Need to think of an alternative method as she moves through the 69s such as IUD. For the time being she wants to continue on the oral contraceptives and I refilled her x1 year. 3. Stop smoking. I reviewed stop smoking strategies. She had tried Chantix in the past unsuccessfully. Weaning methods reviewed. Patient acknowledges the  need to stop smoking. 4. Pap smear/HPV 2014 negative but lacked endocervical cells. Pap/HPV done today. History of high-grade dysplasia status post LEEP 2010. Normal Pap smears since then. 5. Breast health. SBE monthly reviewed. 6. Health maintenance. Baseline CBC comprehensive metabolic panel lipid profile TSH urinalysis TIBC and iron level ordered. Followup for lab results otherwise one year for annual exam.   Note: This document was prepared with digital dictation and possible smart phrase technology. Any transcriptional errors that result from this process are unintentional.   [name] MD, 4:36 PM 06/22/2013

## 2013-06-22 NOTE — Addendum Note (Signed)
Addended by: Nelva Nay on: 06/22/2013 04:51 PM   Modules accepted: Orders

## 2013-06-22 NOTE — Patient Instructions (Signed)
Followup for lab results. Follow up in one year for annual exam.  Health Maintenance, Female A healthy lifestyle and preventative care can promote health and wellness.  Maintain regular health, dental, and eye exams.  Eat a healthy diet. Foods like vegetables, fruits, whole grains, low-fat dairy products, and lean protein foods contain the nutrients you need without too many calories. Decrease your intake of foods high in solid fats, added sugars, and salt. Get information about a proper diet from your caregiver, if necessary.  Regular physical exercise is one of the most important things you can do for your health. Most adults should get at least 150 minutes of moderate-intensity exercise (any activity that increases your heart rate and causes you to sweat) each week. In addition, most adults need muscle-strengthening exercises on 2 or more days a week.   Maintain a healthy weight. The body mass index (BMI) is a screening tool to identify possible weight problems. It provides an estimate of body fat based on height and weight. Your caregiver can help determine your BMI, and can help you achieve or maintain a healthy weight. For adults 20 years and older:  A BMI below 18.5 is considered underweight.  A BMI of 18.5 to 24.9 is normal.  A BMI of 25 to 29.9 is considered overweight.  A BMI of 30 and above is considered obese.  Maintain normal blood lipids and cholesterol by exercising and minimizing your intake of saturated fat. Eat a balanced diet with plenty of fruits and vegetables. Blood tests for lipids and cholesterol should begin at age 85 and be repeated every 5 years. If your lipid or cholesterol levels are high, you are over 50, or you are a high risk for heart disease, you may need your cholesterol levels checked more frequently.Ongoing high lipid and cholesterol levels should be treated with medicines if diet and exercise are not effective.  If you smoke, find out from your  caregiver how to quit. If you do not use tobacco, do not start.  Lung cancer screening is recommended for adults aged 22 80 years who are at high risk for developing lung cancer because of a history of smoking. Yearly low-dose computed tomography (CT) is recommended for people who have at least a 30-pack-year history of smoking and are a current smoker or have quit within the past 15 years. A pack year of smoking is smoking an average of 1 pack of cigarettes a day for 1 year (for example: 1 pack a day for 30 years or 2 packs a day for 15 years). Yearly screening should continue until the smoker has stopped smoking for at least 15 years. Yearly screening should also be stopped for people who develop a health problem that would prevent them from having lung cancer treatment.  If you are pregnant, do not drink alcohol. If you are breastfeeding, be very cautious about drinking alcohol. If you are not pregnant and choose to drink alcohol, do not exceed 1 drink per day. One drink is considered to be 12 ounces (355 mL) of beer, 5 ounces (148 mL) of wine, or 1.5 ounces (44 mL) of liquor.  Avoid use of street drugs. Do not share needles with anyone. Ask for help if you need support or instructions about stopping the use of drugs.  High blood pressure causes heart disease and increases the risk of stroke. Blood pressure should be checked at least every 1 to 2 years. Ongoing high blood pressure should be treated with medicines, if  weight loss and exercise are not effective.  If you are 62 to 31 years old, ask your caregiver if you should take aspirin to prevent strokes.  Diabetes screening involves taking a blood sample to check your fasting blood sugar level. This should be done once every 3 years, after age 29, if you are within normal weight and without risk factors for diabetes. Testing should be considered at a younger age or be carried out more frequently if you are overweight and have at least 1 risk factor  for diabetes.  Breast cancer screening is essential preventative care for women. You should practice "breast self-awareness." This means understanding the normal appearance and feel of your breasts and may include breast self-examination. Any changes detected, no matter how small, should be reported to a caregiver. Women in their 69s and 30s should have a clinical breast exam (CBE) by a caregiver as part of a regular health exam every 1 to 3 years. After age 66, women should have a CBE every year. Starting at age 36, women should consider having a mammogram (breast X-ray) every year. Women who have a family history of breast cancer should talk to their caregiver about genetic screening. Women at a high risk of breast cancer should talk to their caregiver about having an MRI and a mammogram every year.  Breast cancer gene (BRCA)-related cancer risk assessment is recommended for women who have family members with BRCA-related cancers. BRCA-related cancers include breast, ovarian, tubal, and peritoneal cancers. Having family members with these cancers may be associated with an increased risk for harmful changes (mutations) in the breast cancer genes BRCA1 and BRCA2. Results of the assessment will determine the need for genetic counseling and BRCA1 and BRCA2 testing.  The Pap test is a screening test for cervical cancer. Women should have a Pap test starting at age 75. Between ages 42 and 53, Pap tests should be repeated every 2 years. Beginning at age 73, you should have a Pap test every 3 years as long as the past 3 Pap tests have been normal. If you had a hysterectomy for a problem that was not cancer or a condition that could lead to cancer, then you no longer need Pap tests. If you are between ages 79 and 32, and you have had normal Pap tests going back 10 years, you no longer need Pap tests. If you have had past treatment for cervical cancer or a condition that could lead to cancer, you need Pap tests and  screening for cancer for at least 20 years after your treatment. If Pap tests have been discontinued, risk factors (such as a new sexual partner) need to be reassessed to determine if screening should be resumed. Some women have medical problems that increase the chance of getting cervical cancer. In these cases, your caregiver may recommend more frequent screening and Pap tests.  The human papillomavirus (HPV) test is an additional test that may be used for cervical cancer screening. The HPV test looks for the virus that can cause the cell changes on the cervix. The cells collected during the Pap test can be tested for HPV. The HPV test could be used to screen women aged 23 years and older, and should be used in women of any age who have unclear Pap test results. After the age of 77, women should have HPV testing at the same frequency as a Pap test.  Colorectal cancer can be detected and often prevented. Most routine colorectal cancer screening begins  at the age of 56 and continues through age 26. However, your caregiver may recommend screening at an earlier age if you have risk factors for colon cancer. On a yearly basis, your caregiver may provide home test kits to check for hidden blood in the stool. Use of a small camera at the end of a tube, to directly examine the colon (sigmoidoscopy or colonoscopy), can detect the earliest forms of colorectal cancer. Talk to your caregiver about this at age 36, when routine screening begins. Direct examination of the colon should be repeated every 5 to 10 years through age 4, unless early forms of pre-cancerous polyps or small growths are found.  Hepatitis C blood testing is recommended for all people born from [birth year] through 1963 and any individual with known risks for hepatitis C.  Practice safe sex. Use condoms and avoid high-risk sexual practices to reduce the spread of sexually transmitted infections (STIs). Sexually active women aged 66 and younger should be  checked for Chlamydia, which is a common sexually transmitted infection. Older women with new or multiple partners should also be tested for Chlamydia. Testing for other STIs is recommended if you are sexually active and at increased risk.  Osteoporosis is a disease in which the bones lose minerals and strength with aging. This can result in serious bone fractures. The risk of osteoporosis can be identified using a bone density scan. Women ages 47 and over and women at risk for fractures or osteoporosis should discuss screening with their caregivers. Ask your caregiver whether you should be taking a calcium supplement or vitamin D to reduce the rate of osteoporosis.  Menopause can be associated with physical symptoms and risks. Hormone replacement therapy is available to decrease symptoms and risks. You should talk to your caregiver about whether hormone replacement therapy is right for you.  Use sunscreen. Apply sunscreen liberally and repeatedly throughout the day. You should seek shade when your shadow is shorter than you. Protect yourself by wearing long sleeves, pants, a wide-brimmed hat, and sunglasses year round, whenever you are outdoors.  Notify your caregiver of new moles or changes in moles, especially if there is a change in shape or color. Also notify your caregiver if a mole is larger than the size of a pencil eraser.  Stay current with your immunizations. Document Released: 10/14/2010 Document Revised: 07/26/2012 Document Reviewed: 10/14/2010 [facility] Patient Information 2014 [employer].

## 2013-06-23 LAB — URINALYSIS W MICROSCOPIC + REFLEX CULTURE
Bilirubin Urine: NEGATIVE
CASTS: NONE SEEN
GLUCOSE, UA: NEGATIVE mg/dL
Ketones, ur: NEGATIVE mg/dL
LEUKOCYTES UA: NEGATIVE
NITRITE: NEGATIVE
PH: 5.5 (ref 5.0–8.0)
Protein, ur: NEGATIVE mg/dL
Specific Gravity, Urine: 1.026 (ref 1.005–1.030)
Urobilinogen, UA: 0.2 mg/dL (ref 0.0–1.0)

## 2013-06-23 LAB — IRON AND TIBC
%SAT: 19 % — AB (ref 20–55)
Iron: 72 ug/dL (ref 42–145)
TIBC: 386 ug/dL (ref 250–470)
UIBC: 314 ug/dL (ref 125–400)

## 2013-06-24 ENCOUNTER — Other Ambulatory Visit: Payer: 59

## 2013-06-24 LAB — CBC WITH DIFFERENTIAL/PLATELET
BASOS PCT: 0 % (ref 0–1)
Basophils Absolute: 0 10*3/uL (ref 0.0–0.1)
Eosinophils Absolute: 0.2 10*3/uL (ref 0.0–0.7)
Eosinophils Relative: 2 % (ref 0–5)
HEMATOCRIT: 38.1 % (ref 36.0–46.0)
Hemoglobin: 12.9 g/dL (ref 12.0–15.0)
Lymphocytes Relative: 24 % (ref 12–46)
Lymphs Abs: 2.3 10*3/uL (ref 0.7–4.0)
MCH: 29.7 pg (ref 26.0–34.0)
MCHC: 33.9 g/dL (ref 30.0–36.0)
MCV: 87.8 fL (ref 78.0–100.0)
Monocytes Absolute: 0.5 10*3/uL (ref 0.1–1.0)
Monocytes Relative: 5 % (ref 3–12)
NEUTROS ABS: 6.6 10*3/uL (ref 1.7–7.7)
NEUTROS PCT: 69 % (ref 43–77)
Platelets: 480 10*3/uL — ABNORMAL HIGH (ref 150–400)
RBC: 4.34 MIL/uL (ref 3.87–5.11)
RDW: 13.3 % (ref 11.5–15.5)
WBC: 9.6 10*3/uL (ref 4.0–10.5)

## 2013-06-25 LAB — COMPREHENSIVE METABOLIC PANEL
ALBUMIN: 4.3 g/dL (ref 3.5–5.2)
ALK PHOS: 43 U/L (ref 39–117)
ALT: 12 U/L (ref 0–35)
AST: 13 U/L (ref 0–37)
BUN: 9 mg/dL (ref 6–23)
CALCIUM: 9.6 mg/dL (ref 8.4–10.5)
CHLORIDE: 102 meq/L (ref 96–112)
CO2: 30 mEq/L (ref 19–32)
Creat: 0.7 mg/dL (ref 0.50–1.10)
GLUCOSE: 84 mg/dL (ref 70–99)
Potassium: 4.1 mEq/L (ref 3.5–5.3)
SODIUM: 137 meq/L (ref 135–145)
TOTAL PROTEIN: 7.1 g/dL (ref 6.0–8.3)
Total Bilirubin: 0.2 mg/dL (ref 0.2–1.2)

## 2013-06-25 LAB — LIPID PANEL
Cholesterol: 173 mg/dL (ref 0–200)
HDL: 50 mg/dL (ref >39)
LDL Cholesterol: 94 mg/dL (ref 0–99)
Total CHOL/HDL Ratio: 3.5 Ratio
Triglycerides: 146 mg/dL (ref <150)
VLDL: 29 mg/dL (ref 0–40)

## 2013-06-25 LAB — TSH: TSH: 1.92 u[IU]/mL (ref 0.350–4.500)

## 2013-12-28 ENCOUNTER — Telehealth: Payer: Self-pay | Admitting: *Deleted

## 2013-12-28 NOTE — Telephone Encounter (Signed)
Pt called and left message on voicemail c/o pain with intercourse 9-10 pain level this has happened twice now. I left message on pt voicemail to make OV with provider for exam

## 2013-12-29 ENCOUNTER — Encounter: Payer: Self-pay | Admitting: Gynecology

## 2013-12-29 ENCOUNTER — Ambulatory Visit (INDEPENDENT_AMBULATORY_CARE_PROVIDER_SITE_OTHER): Payer: No Typology Code available for payment source | Admitting: Gynecology

## 2013-12-29 DIAGNOSIS — N898 Other specified noninflammatory disorders of vagina: Secondary | ICD-10-CM

## 2013-12-29 DIAGNOSIS — IMO0002 Reserved for concepts with insufficient information to code with codable children: Secondary | ICD-10-CM

## 2013-12-29 LAB — WET PREP FOR TRICH, YEAST, CLUE
Clue Cells Wet Prep HPF POC: NONE SEEN
Trich, Wet Prep: NONE SEEN
Yeast Wet Prep HPF POC: NONE SEEN

## 2013-12-29 MED ORDER — FLUCONAZOLE 150 MG PO TABS: 150.0000 mg | ORAL_TABLET | Freq: Once | ORAL | Status: DC

## 2013-12-29 NOTE — Progress Notes (Signed)
[name] 01-22-1981 [id number]        33 y.o.  G0P0 Presents having had intercourse without difficulty and then afterwards had incredible spasm-like pain in her pelvis/cervix area. Rated 10 out of 10. Resolved over time after taking mother's tramadol. No pain during intercourse. Repeated intercourse 2 days later and had the same thing happened again. Had treated herself previously for yeast infection with OTC three-day cream with resolution of her itching and discharge. No current discharge, odor. No urinary symptoms such as frequency dysuria or urgency. Regular bowel movements. No other abdominal pain or discomfort.  Past medical history,surgical history, problem list, medications, allergies, family history and social history were all reviewed and documented in the EPIC chart.  Directed ROS with pertinent positives and negatives documented in the history of present illness/assessment and plan.  Exam: Kim assistant General appearance:  Normal Abdomen soft nontender without masses guarding rebound or organomegaly. Pelvic external BUS vagina with white cakey discharge. Cervix grossly normal. No motion tenderness. Uterus grossly normal midline mobile nontender. Adnexa without masses or tenderness. Rectal exam is normal.  Assessment/Plan:  33 y.o. G0P0 2 episodes of pelvic pain following intercourse but not during. No history of same before. No other localizing symptoms. Vaginal discharge suspicious for yeast although wet prep was negative. Will cover with Diflucan 150 mg x1 dose. Exam otherwise normal without eliciting the pain on manual or rectal exam.  Recommend pelvic ultrasound rule out nonpalpable abnormalities. Patient will follow up for this and then we'll go from there.     [name] MD, 11:41 AM 12/29/2013

## 2013-12-29 NOTE — Patient Instructions (Signed)
Take the Diflucan pill once. Follow up for ultrasound as scheduled.

## 2013-12-30 LAB — URINALYSIS W MICROSCOPIC + REFLEX CULTURE
BACTERIA UA: NONE SEEN
CASTS: NONE SEEN
Glucose, UA: NEGATIVE mg/dL
Hgb urine dipstick: NEGATIVE
KETONES UR: NEGATIVE mg/dL
NITRITE: NEGATIVE
PH: 6 (ref 5.0–8.0)
Protein, ur: NEGATIVE mg/dL
Specific Gravity, Urine: 1.03 (ref 1.005–1.030)
Urobilinogen, UA: 1 mg/dL (ref 0.0–1.0)

## 2013-12-31 LAB — URINE CULTURE
COLONY COUNT: NO GROWTH
Organism ID, Bacteria: NO GROWTH

## 2014-01-04 ENCOUNTER — Ambulatory Visit: Payer: No Typology Code available for payment source | Admitting: Gynecology

## 2014-01-04 ENCOUNTER — Other Ambulatory Visit: Payer: No Typology Code available for payment source

## 2014-01-13 ENCOUNTER — Other Ambulatory Visit: Payer: No Typology Code available for payment source

## 2014-01-13 ENCOUNTER — Ambulatory Visit: Payer: No Typology Code available for payment source | Admitting: Gynecology

## 2014-05-22 ENCOUNTER — Other Ambulatory Visit: Payer: Self-pay | Admitting: Family

## 2014-06-06 ENCOUNTER — Telehealth: Payer: Self-pay | Admitting: *Deleted

## 2014-06-06 MED ORDER — NORGESTIMATE-ETH ESTRADIOL 0.25-35 MG-MCG PO TABS: ORAL_TABLET | ORAL | Status: DC

## 2014-06-06 NOTE — Telephone Encounter (Signed)
Pt has annual scheduled on 07/25/14 needs refill on birth control pill. 2 packs sent

## 2014-07-25 ENCOUNTER — Encounter: Payer: No Typology Code available for payment source | Admitting: Gynecology

## 2014-09-27 ENCOUNTER — Encounter: Payer: Self-pay | Admitting: Gynecology

## 2014-09-27 ENCOUNTER — Ambulatory Visit (INDEPENDENT_AMBULATORY_CARE_PROVIDER_SITE_OTHER): Payer: 59 | Admitting: Gynecology

## 2014-09-27 VITALS — BP 110/70 | Ht 65.0 in | Wt 175.0 lb

## 2014-09-27 DIAGNOSIS — N898 Other specified noninflammatory disorders of vagina: Secondary | ICD-10-CM | POA: Diagnosis not present

## 2014-09-27 DIAGNOSIS — Z01419 Encounter for gynecological examination (general) (routine) without abnormal findings: Secondary | ICD-10-CM | POA: Diagnosis not present

## 2014-09-27 LAB — WET PREP FOR TRICH, YEAST, CLUE: TRICH WET PREP: NONE SEEN

## 2014-09-27 MED ORDER — METRONIDAZOLE 500 MG PO TABS: 500.0000 mg | ORAL_TABLET | Freq: Two times a day (BID) | ORAL | Status: DC

## 2014-09-27 MED ORDER — NORGESTIMATE-ETH ESTRADIOL 0.25-35 MG-MCG PO TABS: ORAL_TABLET | ORAL | Status: DC

## 2014-09-27 MED ORDER — FLUCONAZOLE 150 MG PO TABS: 150.0000 mg | ORAL_TABLET | Freq: Once | ORAL | Status: DC

## 2014-09-27 NOTE — Progress Notes (Signed)
[name] 10-Jul-1980 [id number]        34 y.o.  G0P0 for annual exam.  Several issues noted below.  Past medical history,surgical history, problem list, medications, allergies, family history and social history were all reviewed and documented as reviewed in the EPIC chart.  ROS:  Performed with pertinent positives and negatives included in the history, assessment and plan.   Additional significant findings :  none   Exam: [name] Vitals:   09/27/14 1550  BP: 110/70  Height: 5\' 5"  (1.651 m)  Weight: 175 lb (79.379 kg)   General appearance:  Normal affect, orientation and appearance. Skin: Grossly normal HEENT: Without gross lesions.  No cervical or supraclavicular adenopathy. Thyroid normal.  Lungs:  Clear without wheezing, rales or rhonchi Cardiac: RR, without RMG Abdominal:  Soft, nontender, without masses, guarding, rebound, organomegaly or hernia Breasts:  Examined lying and sitting without masses, retractions, discharge or axillary adenopathy. Pelvic:  Ext/BUS/vagina with white discharge  Cervix normal  Uterus anteverted, normal size, shape and contour, midline and mobile nontender   Adnexa  Without masses or tenderness    Anus and perineum  Normal   Rectovaginal  Normal sphincter tone without palpated masses or tenderness.    Assessment/Plan:  34 y.o. G0P0 female for annual exam with regular menses, oral contraceptives.   1. Oral contraceptives. Doing well with these. Tried extending out to have every other month withdrawal but had breakthrough bleeding. Alternatives for birth control to include Mirena IUD discussed. Literature was provided and I encouraged her to consider this. Use in a nulliparous patient discussed. Patient will follow up she decides to pursue this otherwise I refilled her Ortho-Cyclen equivalent 1 year. 2. Vaginal discharge with irritation. Patient started knows and some irritation with discharge last week. Used some over-the-counter  Monistat and feels that her symptoms are better but wanted to be checked. She does have a white discharge KOH wet prep is positive for yeast and bacterial vaginosis. Will treat with rectal can 150 mg 1 dose and Flagyl 500 mg twice a day 7 days, alcohol avoidance reviewed. Follow up if symptoms persist or recur. 3. Pap smear/HPV negative 2015.  No Pap smear done today. History of LEEP for CIN-3 2010 with free margins. Follow up Pap smears 5 negative.  Continue to monitor at less frequent intervals per current screening guidelines. 4. Breast health. SBE monthly reviewed. 5. STD screening offered and declined. 6. Health maintenance. Patient plans to see her primary physician for routine checkup and will have her blood work done through their office at her request. Follow up if decides for Mirena IUD otherwise annually.     [name] MD, 4:47 PM 09/27/2014

## 2014-09-27 NOTE — Patient Instructions (Signed)
Take the Flagyl medication twice daily for 7 days for the vaginal discharge. Avoid alcohol while taking. Take the Diflucan pill once for the vaginal discharge.  Follow up if you decide to pursue the Mirena IUD.  You may obtain a copy of any labs that were done today by logging onto MyChart as outlined in the instructions provided with your AVS (after visit summary). The office will not call with normal lab results but certainly if there are any significant abnormalities then we will contact you.   Health Maintenance, Female A healthy lifestyle and preventative care can promote health and wellness.  Maintain regular health, dental, and eye exams.  Eat a healthy diet. Foods like vegetables, fruits, whole grains, low-fat dairy products, and lean protein foods contain the nutrients you need without too many calories. Decrease your intake of foods high in solid fats, added sugars, and salt. Get information about a proper diet from your caregiver, if necessary.  Regular physical exercise is one of the most important things you can do for your health. Most adults should get at least 150 minutes of moderate-intensity exercise (any activity that increases your heart rate and causes you to sweat) each week. In addition, most adults need muscle-strengthening exercises on 2 or more days a week.   Maintain a healthy weight. The body mass index (BMI) is a screening tool to identify possible weight problems. It provides an estimate of body fat based on height and weight. Your caregiver can help determine your BMI, and can help you achieve or maintain a healthy weight. For adults 20 years and older:  A BMI below 18.5 is considered underweight.  A BMI of 18.5 to 24.9 is normal.  A BMI of 25 to 29.9 is considered overweight.  A BMI of 30 and above is considered obese.  Maintain normal blood lipids and cholesterol by exercising and minimizing your intake of saturated fat. Eat a balanced diet with plenty of  fruits and vegetables. Blood tests for lipids and cholesterol should begin at age 43 and be repeated every 5 years. If your lipid or cholesterol levels are high, you are over 50, or you are a high risk for heart disease, you may need your cholesterol levels checked more frequently.Ongoing high lipid and cholesterol levels should be treated with medicines if diet and exercise are not effective.  If you smoke, find out from your caregiver how to quit. If you do not use tobacco, do not start.  Lung cancer screening is recommended for adults aged 21 80 years who are at high risk for developing lung cancer because of a history of smoking. Yearly low-dose computed tomography (CT) is recommended for people who have at least a 30-pack-year history of smoking and are a current smoker or have quit within the past 15 years. A pack year of smoking is smoking an average of 1 pack of cigarettes a day for 1 year (for example: 1 pack a day for 30 years or 2 packs a day for 15 years). Yearly screening should continue until the smoker has stopped smoking for at least 15 years. Yearly screening should also be stopped for people who develop a health problem that would prevent them from having lung cancer treatment.  If you are pregnant, do not drink alcohol. If you are breastfeeding, be very cautious about drinking alcohol. If you are not pregnant and choose to drink alcohol, do not exceed 1 drink per day. One drink is considered to be 12 ounces (355 mL)  of beer, 5 ounces (148 mL) of wine, or 1.5 ounces (44 mL) of liquor.  Avoid use of street drugs. Do not share needles with anyone. Ask for help if you need support or instructions about stopping the use of drugs.  High blood pressure causes heart disease and increases the risk of stroke. Blood pressure should be checked at least every 1 to 2 years. Ongoing high blood pressure should be treated with medicines, if weight loss and exercise are not effective.  If you are 22 to  32 years old, ask your caregiver if you should take aspirin to prevent strokes.  Diabetes screening involves taking a blood sample to check your fasting blood sugar level. This should be done once every 3 years, after age 69, if you are within normal weight and without risk factors for diabetes. Testing should be considered at a younger age or be carried out more frequently if you are overweight and have at least 1 risk factor for diabetes.  Breast cancer screening is essential preventative care for women. You should practice "breast self-awareness." This means understanding the normal appearance and feel of your breasts and may include breast self-examination. Any changes detected, no matter how small, should be reported to a caregiver. Women in their 32s and 30s should have a clinical breast exam (CBE) by a caregiver as part of a regular health exam every 1 to 3 years. After age 72, women should have a CBE every year. Starting at age 71, women should consider having a mammogram (breast X-ray) every year. Women who have a family history of breast cancer should talk to their caregiver about genetic screening. Women at a high risk of breast cancer should talk to their caregiver about having an MRI and a mammogram every year.  Breast cancer gene (BRCA)-related cancer risk assessment is recommended for women who have family members with BRCA-related cancers. BRCA-related cancers include breast, ovarian, tubal, and peritoneal cancers. Having family members with these cancers may be associated with an increased risk for harmful changes (mutations) in the breast cancer genes BRCA1 and BRCA2. Results of the assessment will determine the need for genetic counseling and BRCA1 and BRCA2 testing.  The Pap test is a screening test for cervical cancer. Women should have a Pap test starting at age 38. Between ages 43 and 48, Pap tests should be repeated every 2 years. Beginning at age 77, you should have a Pap test every  3 years as long as the past 3 Pap tests have been normal. If you had a hysterectomy for a problem that was not cancer or a condition that could lead to cancer, then you no longer need Pap tests. If you are between ages 76 and 85, and you have had normal Pap tests going back 10 years, you no longer need Pap tests. If you have had past treatment for cervical cancer or a condition that could lead to cancer, you need Pap tests and screening for cancer for at least 20 years after your treatment. If Pap tests have been discontinued, risk factors (such as a new sexual partner) need to be reassessed to determine if screening should be resumed. Some women have medical problems that increase the chance of getting cervical cancer. In these cases, your caregiver may recommend more frequent screening and Pap tests.  The human papillomavirus (HPV) test is an additional test that may be used for cervical cancer screening. The HPV test looks for the virus that can cause the cell changes  on the cervix. The cells collected during the Pap test can be tested for HPV. The HPV test could be used to screen women aged 21 years and older, and should be used in women of any age who have unclear Pap test results. After the age of 28, women should have HPV testing at the same frequency as a Pap test.  Colorectal cancer can be detected and often prevented. Most routine colorectal cancer screening begins at the age of 70 and continues through age 32. However, your caregiver may recommend screening at an earlier age if you have risk factors for colon cancer. On a yearly basis, your caregiver may provide home test kits to check for hidden blood in the stool. Use of a small camera at the end of a tube, to directly examine the colon (sigmoidoscopy or colonoscopy), can detect the earliest forms of colorectal cancer. Talk to your caregiver about this at age 86, when routine screening begins. Direct examination of the colon should be repeated every  5 to 10 years through age 28, unless early forms of pre-cancerous polyps or small growths are found.  Hepatitis C blood testing is recommended for all people born from [birth year] through 1963 and any individual with known risks for hepatitis C.  Practice safe sex. Use condoms and avoid high-risk sexual practices to reduce the spread of sexually transmitted infections (STIs). Sexually active women aged 40 and younger should be checked for Chlamydia, which is a common sexually transmitted infection. Older women with new or multiple partners should also be tested for Chlamydia. Testing for other STIs is recommended if you are sexually active and at increased risk.  Osteoporosis is a disease in which the bones lose minerals and strength with aging. This can result in serious bone fractures. The risk of osteoporosis can be identified using a bone density scan. Women ages 56 and over and women at risk for fractures or osteoporosis should discuss screening with their caregivers. Ask your caregiver whether you should be taking a calcium supplement or vitamin D to reduce the rate of osteoporosis.  Menopause can be associated with physical symptoms and risks. Hormone replacement therapy is available to decrease symptoms and risks. You should talk to your caregiver about whether hormone replacement therapy is right for you.  Use sunscreen. Apply sunscreen liberally and repeatedly throughout the day. You should seek shade when your shadow is shorter than you. Protect yourself by wearing long sleeves, pants, a wide-brimmed hat, and sunglasses year round, whenever you are outdoors.  Notify your caregiver of new moles or changes in moles, especially if there is a change in shape or color. Also notify your caregiver if a mole is larger than the size of a pencil eraser.  Stay current with your immunizations. Document Released: 10/14/2010 Document Revised: 07/26/2012 Document Reviewed: 10/14/2010 [facility] Patient  Information 2014 [place].

## 2014-09-28 ENCOUNTER — Other Ambulatory Visit: Payer: 59

## 2014-09-28 ENCOUNTER — Other Ambulatory Visit: Payer: Self-pay | Admitting: Gynecology

## 2014-09-28 DIAGNOSIS — R3129 Other microscopic hematuria: Secondary | ICD-10-CM

## 2014-09-28 LAB — URINALYSIS W MICROSCOPIC + REFLEX CULTURE
Bacteria, UA: NONE SEEN
Bilirubin Urine: NEGATIVE
CASTS: NONE SEEN
CRYSTALS: NONE SEEN
Glucose, UA: NEGATIVE mg/dL
Ketones, ur: NEGATIVE mg/dL
LEUKOCYTES UA: NEGATIVE
Nitrite: NEGATIVE
Protein, ur: NEGATIVE mg/dL
Squamous Epithelial / LPF: NONE SEEN
Urobilinogen, UA: 0.2 mg/dL (ref 0.0–1.0)
pH: 5.5 (ref 5.0–8.0)

## 2014-09-29 LAB — URINALYSIS W MICROSCOPIC + REFLEX CULTURE
Bacteria, UA: NONE SEEN
Bilirubin Urine: NEGATIVE
CRYSTALS: NONE SEEN
Casts: NONE SEEN
Glucose, UA: NEGATIVE mg/dL
KETONES UR: NEGATIVE mg/dL
Leukocytes, UA: NEGATIVE
NITRITE: NEGATIVE
Protein, ur: NEGATIVE mg/dL
Specific Gravity, Urine: 1.025 (ref 1.005–1.030)
Squamous Epithelial / LPF: NONE SEEN
Urobilinogen, UA: 0.2 mg/dL (ref 0.0–1.0)
pH: 6 (ref 5.0–8.0)

## 2014-09-29 LAB — URINE CULTURE
COLONY COUNT: NO GROWTH
Organism ID, Bacteria: NO GROWTH

## 2014-10-02 ENCOUNTER — Telehealth: Payer: Self-pay

## 2014-10-02 NOTE — Telephone Encounter (Signed)
normal

## 2014-10-02 NOTE — Telephone Encounter (Signed)
Patient called to check on result of follow up urine specimen from 09/28/14.  The first u/a had RBC's in it.  What to tell her about follow up result?

## 2014-10-02 NOTE — Telephone Encounter (Signed)
Left detailed message on her cell phone at her request that u/a was normal.

## 2014-10-25 ENCOUNTER — Other Ambulatory Visit: Payer: Self-pay | Admitting: Gynecology

## 2015-03-29 ENCOUNTER — Encounter: Payer: Self-pay | Admitting: Women's Health

## 2015-03-29 ENCOUNTER — Ambulatory Visit (INDEPENDENT_AMBULATORY_CARE_PROVIDER_SITE_OTHER): Payer: 59 | Admitting: Women's Health

## 2015-03-29 VITALS — BP 122/80 | Ht 65.0 in | Wt 175.0 lb

## 2015-03-29 DIAGNOSIS — R350 Frequency of micturition: Secondary | ICD-10-CM | POA: Diagnosis not present

## 2015-03-29 DIAGNOSIS — B373 Candidiasis of vulva and vagina: Secondary | ICD-10-CM

## 2015-03-29 DIAGNOSIS — N3000 Acute cystitis without hematuria: Secondary | ICD-10-CM | POA: Diagnosis not present

## 2015-03-29 DIAGNOSIS — N760 Acute vaginitis: Secondary | ICD-10-CM | POA: Diagnosis not present

## 2015-03-29 DIAGNOSIS — Z113 Encounter for screening for infections with a predominantly sexual mode of transmission: Secondary | ICD-10-CM

## 2015-03-29 DIAGNOSIS — A499 Bacterial infection, unspecified: Secondary | ICD-10-CM

## 2015-03-29 DIAGNOSIS — B3731 Acute candidiasis of vulva and vagina: Secondary | ICD-10-CM

## 2015-03-29 DIAGNOSIS — B9689 Other specified bacterial agents as the cause of diseases classified elsewhere: Secondary | ICD-10-CM

## 2015-03-29 LAB — URINALYSIS W MICROSCOPIC + REFLEX CULTURE
BILIRUBIN URINE: NEGATIVE
CASTS: NONE SEEN [LPF]
Crystals: NONE SEEN [HPF]
Glucose, UA: NEGATIVE
KETONES UR: NEGATIVE
NITRITE: NEGATIVE
PH: 7 (ref 5.0–8.0)
SPECIFIC GRAVITY, URINE: 1.02 (ref 1.001–1.035)
Yeast: NONE SEEN [HPF]

## 2015-03-29 LAB — WET PREP FOR TRICH, YEAST, CLUE
Trich, Wet Prep: NONE SEEN
WBC, Wet Prep HPF POC: NONE SEEN
Yeast Wet Prep HPF POC: NONE SEEN

## 2015-03-29 MED ORDER — SULFAMETHOXAZOLE-TRIMETHOPRIM 800-160 MG PO TABS: 1.0000 | ORAL_TABLET | Freq: Two times a day (BID) | ORAL | Status: DC

## 2015-03-29 MED ORDER — FLUCONAZOLE 150 MG PO TABS: 150.0000 mg | ORAL_TABLET | Freq: Once | ORAL | Status: DC

## 2015-03-29 MED ORDER — METRONIDAZOLE 0.75 % VA GEL: VAGINAL | Status: DC

## 2015-03-29 NOTE — Progress Notes (Signed)
Patient ID: [name], female   DOB: [birth year], 34 y.o.   MRN: [id number] Presents with several issues. Symptoms started yesterday with increased urinary frequency, urgency with burning. Noted blood in urine today and symptoms worse. New partner. Has had problems with recurrent yeast. Denies abdominal pain or fever. Contraceptives with  Ortho-Cyclen.  Exam: Appears well. External genitalia within normal limits, speculum exam moderate amount of a white adherent discharge wet prep positive for few clues, many bacteria. GC/Chlamydia culture taken. Bimanual no CMT or adnexal tenderness. No CVAT. UA: +3 blood, +3 leukocytes, packed WBCs, packed RBCs, many bacteria  UTI Bacteria vaginosis STD screen  Plan: Septra twice daily for 3 days prescription, proper use given and reviewed. MetroGel vaginal cream 1 applicator at bedtime 5 prescription, proper use given and reviewed. Call precautions reviewed. Prescription for Diflucan 150 mg 1 dose if yeast symptoms recur after antibiotics. GC/Chlamydia culture pending, HIV, hep B, C, RPR. Reviewed importance of condoms until permanent partner.

## 2015-03-29 NOTE — Patient Instructions (Signed)
Urinary Tract Infection Urinary tract infections (UTIs) can develop anywhere along your urinary tract. Your urinary tract is your body's drainage system for removing wastes and extra water. Your urinary tract includes two kidneys, two ureters, a bladder, and a urethra. Your kidneys are a pair of bean-shaped organs. Each kidney is about the size of your fist. They are located below your ribs, one on each side of your spine. CAUSES Infections are caused by microbes, which are microscopic organisms, including fungi, viruses, and bacteria. These organisms are so small that they can only be seen through a microscope. Bacteria are the microbes that most commonly cause UTIs. SYMPTOMS  Symptoms of UTIs may vary by age and gender of the patient and by the location of the infection. Symptoms in Tiffany Talarico women typically include a frequent and intense urge to urinate and a painful, burning feeling in the bladder or urethra during urination. Older women and men are more likely to be tired, shaky, and weak and have muscle aches and abdominal pain. A fever may mean the infection is in your kidneys. Other symptoms of a kidney infection include pain in your back or sides below the ribs, nausea, and vomiting. DIAGNOSIS To diagnose a UTI, your caregiver will ask you about your symptoms. Your caregiver will also ask you to provide a urine sample. The urine sample will be tested for bacteria and white blood cells. White blood cells are made by your body to help fight infection. TREATMENT  Typically, UTIs can be treated with medication. Because most UTIs are caused by a bacterial infection, they usually can be treated with the use of antibiotics. The choice of antibiotic and length of treatment depend on your symptoms and the type of bacteria causing your infection. HOME CARE INSTRUCTIONS  If you were prescribed antibiotics, take them exactly as your caregiver instructs you. Finish the medication even if you feel better after  you have only taken some of the medication.  Drink enough water and fluids to keep your urine clear or pale yellow.  Avoid caffeine, tea, and carbonated beverages. They tend to irritate your bladder.  Empty your bladder often. Avoid holding urine for long periods of time.  Empty your bladder before and after sexual intercourse.  After a bowel movement, women should cleanse from front to back. Use each tissue only once. SEEK MEDICAL CARE IF:   You have back pain.  You develop a fever.  Your symptoms do not begin to resolve within 3 days. SEEK IMMEDIATE MEDICAL CARE IF:   You have severe back pain or lower abdominal pain.  You develop chills.  You have nausea or vomiting.  You have continued burning or discomfort with urination. MAKE SURE YOU:   Understand these instructions.  Will watch your condition.  Will get help right away if you are not doing well or get worse.   This information is not intended to replace advice given to you by your health care provider. Make sure you discuss any questions you have with your health care provider.   Document Released: 01/08/2005 Document Revised: 12/20/2014 Document Reviewed: 05/09/2011 Elsevier Interactive Patient Education 2016 [employer]. Bacterial Vaginosis Bacterial vaginosis is a vaginal infection that occurs when the normal balance of bacteria in the vagina is disrupted. It results from an overgrowth of certain bacteria. This is the most common vaginal infection in women of childbearing age. Treatment is important to prevent complications, especially in pregnant women, as it can cause a premature delivery. CAUSES  Bacterial  vaginosis is caused by an increase in harmful bacteria that are normally present in smaller amounts in the vagina. Several different kinds of bacteria can cause bacterial vaginosis. However, the reason that the condition develops is not fully understood. RISK FACTORS Certain activities or behaviors can  put you at an increased risk of developing bacterial vaginosis, including:  Having a new sex partner or multiple sex partners.  Douching.  Using an intrauterine device (IUD) for contraception. Women do not get bacterial vaginosis from toilet seats, bedding, swimming pools, or contact with objects around them. SIGNS AND SYMPTOMS  Some women with bacterial vaginosis have no signs or symptoms. Common symptoms include:  Grey vaginal discharge.  A fishlike odor with discharge, especially after sexual intercourse.  Itching or burning of the vagina and vulva.  Burning or pain with urination. DIAGNOSIS  Your health care provider will take a medical history and examine the vagina for signs of bacterial vaginosis. A sample of vaginal fluid may be taken. Your health care provider will look at this sample under a microscope to check for bacteria and abnormal cells. A vaginal pH test may also be done.  TREATMENT  Bacterial vaginosis may be treated with antibiotic medicines. These may be given in the form of a pill or a vaginal cream. A second round of antibiotics may be prescribed if the condition comes back after treatment. Because bacterial vaginosis increases your risk for sexually transmitted diseases, getting treated can help reduce your risk for chlamydia, gonorrhea, HIV, and herpes. HOME CARE INSTRUCTIONS   Only take over-the-counter or prescription medicines as directed by your health care provider.  If antibiotic medicine was prescribed, take it as directed. Make sure you finish it even if you start to feel better.  Tell all sexual partners that you have a vaginal infection. They should see their health care provider and be treated if they have problems, such as a mild rash or itching.  During treatment, it is important that you follow these instructions:  Avoid sexual activity or use condoms correctly.  Do not douche.  Avoid alcohol as directed by your health care provider.  Avoid  breastfeeding as directed by your health care provider. SEEK MEDICAL CARE IF:   Your symptoms are not improving after 3 days of treatment.  You have increased discharge or pain.  You have a fever. MAKE SURE YOU:   Understand these instructions.  Will watch your condition.  Will get help right away if you are not doing well or get worse. FOR MORE INFORMATION  Centers for Disease Control and Prevention, Division of STD Prevention: [employer] American Sexual Health Association (ASHA): www.ashastd.org    This information is not intended to replace advice given to you by your health care provider. Make sure you discuss any questions you have with your health care provider.   Document Released: 03/31/2005 Document Revised: 04/21/2014 Document Reviewed: 11/10/2012 Elsevier Interactive Patient Education [employer].

## 2015-03-30 ENCOUNTER — Telehealth: Payer: Self-pay

## 2015-03-30 ENCOUNTER — Other Ambulatory Visit: Payer: Self-pay | Admitting: Women's Health

## 2015-03-30 LAB — GC/CHLAMYDIA PROBE AMP
CT Probe RNA: DETECTED — AB
GC PROBE AMP APTIMA: NOT DETECTED

## 2015-03-30 LAB — HEPATITIS B SURFACE ANTIGEN: Hepatitis B Surface Ag: NEGATIVE

## 2015-03-30 LAB — URINE CULTURE
COLONY COUNT: NO GROWTH
ORGANISM ID, BACTERIA: NO GROWTH

## 2015-03-30 LAB — RPR

## 2015-03-30 LAB — HIV ANTIBODY (ROUTINE TESTING W REFLEX): HIV: NONREACTIVE

## 2015-03-30 LAB — HEPATITIS C ANTIBODY: HCV Ab: NEGATIVE

## 2015-03-30 MED ORDER — AZITHROMYCIN 500 MG PO TABS: 1000.0000 mg | ORAL_TABLET | Freq: Once | ORAL | Status: DC

## 2015-03-30 NOTE — Telephone Encounter (Signed)
Best to wait at least 3 weeks, if her insurance does not cover she could get it checked at the health department for free

## 2015-03-30 NOTE — Telephone Encounter (Signed)
-----   Message from Huel Cote, NP sent at 03/30/2015  3:49 PM EST ----- Please call and review urine culture was negative. Positive chlamydia. Zithromax 1 g by mouth 1 dose. Abstain, inform partner for treatment, return to office in 3 weeks for test of cure.

## 2015-03-30 NOTE — Telephone Encounter (Signed)
Patient advised. Claudia made her appt.

## 2015-03-30 NOTE — Telephone Encounter (Signed)
Patient asked if she could come back in test of cure in 2 weeks because of ins change Jan 1.  I told her problem with coming too soon is medication has to get in system and clear the infection and may have a pos result still. I told her I would check with you and see what you say.

## 2015-04-25 ENCOUNTER — Encounter: Payer: Self-pay | Admitting: Women's Health

## 2015-04-25 ENCOUNTER — Ambulatory Visit (INDEPENDENT_AMBULATORY_CARE_PROVIDER_SITE_OTHER): Payer: BLUE CROSS/BLUE SHIELD | Admitting: Women's Health

## 2015-04-25 VITALS — BP 118/80 | Ht 65.0 in | Wt 175.0 lb

## 2015-04-25 DIAGNOSIS — Z113 Encounter for screening for infections with a predominantly sexual mode of transmission: Secondary | ICD-10-CM | POA: Diagnosis not present

## 2015-04-25 NOTE — Patient Instructions (Signed)
Chlamydia, Female Chlamydia is an infection. It is spread through sexual contact. Chlamydia can be in different areas of the body. These areas include the cervix, urethra, throat, or rectum. You may not know you have chlamydia because many people never develop the symptoms. Chlamydia is not difficult to treat once you know you have it. However, if it is left untreated, chlamydia can lead to more serious health problems.  CAUSES  Chlamydia is caused by bacteria. It is a sexually transmitted disease. It is passed from an infected partner during intimate contact. This contact could be with the genitals, mouth, or rectal area. Chlamydia can also be passed from mothers to babies during birth. SIGNS AND SYMPTOMS  There may not be any symptoms. This is often the case early in the infection. If symptoms develop, they may include:  Mild pain and discomfort when urinating.  Redness, soreness, and swelling (inflammation) of the rectum.  Vaginal discharge.  Painful intercourse.  Abdominal pain.  Bleeding between menstrual periods. DIAGNOSIS  To diagnose this infection, your health care provider will do a pelvic exam. Cultures will be taken of the vagina, cervix, urine, and possibly the rectum to verify the diagnosis.  TREATMENT You will be given antibiotic medicines. If you are pregnant, certain types of antibiotics will need to be avoided. Any sexual partners should also be treated, even if they do not show symptoms. Your health care provider may test you for infection again 3 months after treatment. HOME CARE INSTRUCTIONS   Take your antibiotic medicine as directed by your health care provider. Finish the antibiotic even if you start to feel better.  Take medicines only as directed by your health care provider.  Inform any sexual partners about the infection. They should also be treated.  Do not have sexual contact until your health care provider tells you it is okay.  Get plenty of  rest.  Eat a well-balanced diet.  Drink enough fluids to keep your urine clear or pale yellow.  Keep all follow-up visits as directed by your health care provider. SEEK MEDICAL CARE IF:  You have painful urination.  You have abdominal pain.  You have vaginal discharge.  You have painful sexual intercourse.  You have bleeding between periods and after sex.  You have a fever. SEEK IMMEDIATE MEDICAL CARE IF:   You experience nausea or vomiting.  You experience excessive sweating (diaphoresis).  You have difficulty swallowing.   This information is not intended to replace advice given to you by your health care provider. Make sure you discuss any questions you have with your health care provider.   Document Released: 01/08/2005 Document Revised: 12/20/2014 Document Reviewed: 12/06/2012 Elsevier Interactive Patient Education [employer].

## 2015-04-25 NOTE — Progress Notes (Signed)
Patient ID: [name], female   DOB: 1980/12/09, 35 y.o.   MRN: [id number] Presents for test of cure for Chlamydia. Partner treated, abstained. Had a negative HIV, hepatitis and RPR at annual exam. Denies discharge, abdominal pain, urinary symptoms or fever. Monthly cycle on Ortho-Cyclen.  Exam: Appears well. External genitalia within normal limits, speculum exam scant white discharge without odor noted, GC/Chlamydia culture taken. Bimanual no CMT or adnexal tenderness.  Test of cure chlamydia  Plan: GC/Chlamydia culture pending. Reviewed importance of condoms until permanent partner.

## 2015-04-26 LAB — GC/CHLAMYDIA PROBE AMP
CT PROBE, AMP APTIMA: NOT DETECTED
GC PROBE AMP APTIMA: NOT DETECTED

## 2015-09-27 ENCOUNTER — Other Ambulatory Visit: Payer: Self-pay | Admitting: Gynecology

## 2015-10-25 ENCOUNTER — Other Ambulatory Visit: Payer: Self-pay | Admitting: Women's Health

## 2015-10-27 ENCOUNTER — Other Ambulatory Visit: Payer: Self-pay | Admitting: Gynecology

## 2015-10-29 NOTE — Telephone Encounter (Signed)
Annual on 12/21/15

## 2015-11-12 DIAGNOSIS — F410 Panic disorder [episodic paroxysmal anxiety] without agoraphobia: Secondary | ICD-10-CM | POA: Diagnosis not present

## 2015-11-12 DIAGNOSIS — F900 Attention-deficit hyperactivity disorder, predominantly inattentive type: Secondary | ICD-10-CM | POA: Diagnosis not present

## 2015-12-21 ENCOUNTER — Ambulatory Visit (INDEPENDENT_AMBULATORY_CARE_PROVIDER_SITE_OTHER): Payer: BLUE CROSS/BLUE SHIELD | Admitting: Gynecology

## 2015-12-21 ENCOUNTER — Encounter: Payer: Self-pay | Admitting: Gynecology

## 2015-12-21 VITALS — BP 116/70 | Ht 65.0 in | Wt 188.0 lb

## 2015-12-21 DIAGNOSIS — Z01419 Encounter for gynecological examination (general) (routine) without abnormal findings: Secondary | ICD-10-CM | POA: Diagnosis not present

## 2015-12-21 DIAGNOSIS — Z13220 Encounter for screening for lipoid disorders: Secondary | ICD-10-CM | POA: Diagnosis not present

## 2015-12-21 LAB — CBC WITH DIFFERENTIAL/PLATELET
BASOS PCT: 0 %
Basophils Absolute: 0 cells/uL (ref 0–200)
Eosinophils Absolute: 81 cells/uL (ref 15–500)
Eosinophils Relative: 1 %
HEMATOCRIT: 40 % (ref 35.0–45.0)
Hemoglobin: 13.3 g/dL (ref 11.7–15.5)
LYMPHS PCT: 30 %
Lymphs Abs: 2430 cells/uL (ref 850–3900)
MCH: 28.6 pg (ref 27.0–33.0)
MCHC: 33.3 g/dL (ref 32.0–36.0)
MCV: 86 fL (ref 80.0–100.0)
MONO ABS: 486 {cells}/uL (ref 200–950)
MONOS PCT: 6 %
MPV: 8.7 fL (ref 7.5–12.5)
NEUTROS ABS: 5103 {cells}/uL (ref 1500–7800)
Neutrophils Relative %: 63 %
PLATELETS: 472 10*3/uL — AB (ref 140–400)
RBC: 4.65 MIL/uL (ref 3.80–5.10)
RDW: 13.6 % (ref 11.0–15.0)
WBC: 8.1 10*3/uL (ref 3.8–10.8)

## 2015-12-21 LAB — COMPREHENSIVE METABOLIC PANEL
ALK PHOS: 56 U/L (ref 33–115)
ALT: 18 U/L (ref 6–29)
AST: 16 U/L (ref 10–30)
Albumin: 4.7 g/dL (ref 3.6–5.1)
BILIRUBIN TOTAL: 0.3 mg/dL (ref 0.2–1.2)
BUN: 12 mg/dL (ref 7–25)
CO2: 27 mmol/L (ref 20–31)
Calcium: 9.8 mg/dL (ref 8.6–10.2)
Chloride: 103 mmol/L (ref 98–110)
Creat: 0.73 mg/dL (ref 0.50–1.10)
GLUCOSE: 91 mg/dL (ref 65–99)
Potassium: 4.9 mmol/L (ref 3.5–5.3)
Sodium: 138 mmol/L (ref 135–146)
Total Protein: 7.7 g/dL (ref 6.1–8.1)

## 2015-12-21 LAB — LIPID PANEL
Cholesterol: 210 mg/dL — ABNORMAL HIGH (ref 125–200)
HDL: 49 mg/dL (ref >=46)
LDL Cholesterol: 133 mg/dL — ABNORMAL HIGH (ref <130)
Total CHOL/HDL Ratio: 4.3 Ratio (ref <=5.0)
Triglycerides: 138 mg/dL (ref <150)
VLDL: 28 mg/dL (ref <30)

## 2015-12-21 MED ORDER — NORGESTIMATE-ETH ESTRADIOL 0.25-35 MG-MCG PO TABS: 1.0000 | ORAL_TABLET | Freq: Every day | ORAL | 12 refills | Status: DC

## 2015-12-21 NOTE — Progress Notes (Signed)
    [name] [birth year] [id number]        35 y.o.  G0P0  for annual exam.  Doing well without complaints  Past medical history,surgical history, problem list, medications, allergies, family history and social history were all reviewed and documented as reviewed in the EPIC chart.  ROS:  Performed with pertinent positives and negatives included in the history, assessment and plan.   Additional significant findings :  None   Exam: Caryn Bee assistant Vitals:   12/21/15 1055  BP: 116/70  Weight: 188 lb (85.3 kg)  Height: 5\' 5"  (1.651 m)   Body mass index is 31.28 kg/m.  General appearance:  Normal affect, orientation and appearance. Skin: Grossly normal HEENT: Without gross lesions.  No cervical or supraclavicular adenopathy. Thyroid normal.  Lungs:  Clear without wheezing, rales or rhonchi Cardiac: RR, without RMG Abdominal:  Soft, nontender, without masses, guarding, rebound, organomegaly or hernia Breasts:  Examined lying and sitting without masses, retractions, discharge or axillary adenopathy. Pelvic:  Ext/BUS/Vagina normal  Cervix normal  Uterus anteverted, normal size, shape and contour, midline and mobile nontender   Adnexa without masses or tenderness    Anus and perineum normal   Rectovaginal normal sphincter tone without palpated masses or tenderness.    Assessment/Plan:  35 y.o. G0P0 female for annual exam with regular menses on oral contraceptives.   1. Continues on MonoNessa BCPs. I reviewed her smoking history and we discussed her increased risk of thrombosis with pills and smoking as she ages. We again discussed alternatives and she is seriously contemplating IUD. I discussed the Mirena IUD with her, the insertional process and the risks to include infection, perforation or migration requiring surgery to remove, hormonal absorption with side effects and failure risks. Patient wants to check with her insurance. I did refill her pills 1 year. 2. Pap  smear/HPV 2015 negative. No Pap smear done. History of LEEP for HGSIL 2010 with free margins. Follow up Pap smears 5 were negative. Plan repeat Pap smear next year at 3 year interval 3. Breast health SBE monthly reviewed. Will plan mammogram at age 35. 48. Health maintenance. Baseline CBC, CMP, lipid profile, urinalysis ordered. Follow up 1 year, sooner if she decides on Mirena IUD.   [name] MD, 11:10 AM 12/21/2015

## 2015-12-21 NOTE — Patient Instructions (Signed)

## 2015-12-22 LAB — URINALYSIS W MICROSCOPIC + REFLEX CULTURE
BACTERIA UA: NONE SEEN [HPF]
BILIRUBIN URINE: NEGATIVE
CRYSTALS: NONE SEEN [HPF]
Casts: NONE SEEN [LPF]
GLUCOSE, UA: NEGATIVE
Ketones, ur: NEGATIVE
Leukocytes, UA: NEGATIVE
NITRITE: NEGATIVE
PROTEIN: NEGATIVE
SQUAMOUS EPITHELIAL / LPF: NONE SEEN [HPF] (ref <=5)
Specific Gravity, Urine: 1.022 (ref 1.001–1.035)
WBC UA: NONE SEEN WBC/HPF (ref <=5)
Yeast: NONE SEEN [HPF]
pH: 7 (ref 5.0–8.0)

## 2015-12-23 ENCOUNTER — Other Ambulatory Visit: Payer: Self-pay | Admitting: Gynecology

## 2015-12-23 LAB — URINE CULTURE: ORGANISM ID, BACTERIA: NO GROWTH

## 2015-12-24 ENCOUNTER — Other Ambulatory Visit: Payer: Self-pay

## 2015-12-24 NOTE — Telephone Encounter (Signed)
Opened encounter in error. Not needed

## 2015-12-25 ENCOUNTER — Other Ambulatory Visit: Payer: Self-pay | Admitting: Gynecology

## 2015-12-25 DIAGNOSIS — R3129 Other microscopic hematuria: Secondary | ICD-10-CM

## 2016-01-01 ENCOUNTER — Encounter: Payer: Self-pay | Admitting: Women's Health

## 2016-01-01 ENCOUNTER — Ambulatory Visit (INDEPENDENT_AMBULATORY_CARE_PROVIDER_SITE_OTHER): Payer: BLUE CROSS/BLUE SHIELD | Admitting: Women's Health

## 2016-01-01 VITALS — BP 124/80 | Ht 65.0 in | Wt 191.0 lb

## 2016-01-01 DIAGNOSIS — A499 Bacterial infection, unspecified: Secondary | ICD-10-CM | POA: Diagnosis not present

## 2016-01-01 DIAGNOSIS — B9689 Other specified bacterial agents as the cause of diseases classified elsewhere: Secondary | ICD-10-CM

## 2016-01-01 DIAGNOSIS — R8290 Unspecified abnormal findings in urine: Secondary | ICD-10-CM

## 2016-01-01 DIAGNOSIS — N760 Acute vaginitis: Secondary | ICD-10-CM | POA: Diagnosis not present

## 2016-01-01 LAB — URINALYSIS W MICROSCOPIC + REFLEX CULTURE
BILIRUBIN URINE: NEGATIVE
Casts: NONE SEEN [LPF]
GLUCOSE, UA: NEGATIVE
Ketones, ur: NEGATIVE
LEUKOCYTES UA: NEGATIVE
Nitrite: NEGATIVE
PH: 7 (ref 5.0–8.0)
PROTEIN: NEGATIVE
Specific Gravity, Urine: 1.02 (ref 1.001–1.035)
WBC UA: NONE SEEN WBC/HPF (ref <=5)
Yeast: NONE SEEN [HPF]

## 2016-01-01 LAB — WET PREP FOR TRICH, YEAST, CLUE
TRICH WET PREP: NONE SEEN
WBC, Wet Prep HPF POC: NONE SEEN
Yeast Wet Prep HPF POC: NONE SEEN

## 2016-01-01 MED ORDER — METRONIDAZOLE 0.75 % VA GEL: VAGINAL | 0 refills | Status: DC

## 2016-01-01 NOTE — Addendum Note (Signed)
Addended by: Thamas Jaegers on: 01/01/2016 02:57 PM   Modules accepted: Orders

## 2016-01-01 NOTE — Patient Instructions (Signed)

## 2016-01-01 NOTE — Progress Notes (Signed)
Presents with multiple complaints including malodor in vaginal area, nausea, headache, acid reflux, and decreased appetite. States she was also instructed to return after 9/8 annual visit because urine sample had blood in it  for repeat. Denies vaginal irritation or itching, abnormal discharge, abdominal or pelvic pain, vomiting, diarrhea, constipation, urinary frequency/urgency, dysuria, visible hematuria. History of (+) Chlamydia in Dec. 2017 with (-) TOC. Contraception with OCPs, same partner for over 1 year. LMP Sept. 5, states it was normal.   Exam: Appears well. External genitalia within normal limits. Speculum exam vaginal walls and cervix normal, non-erythematous, scant white discharge. Tenderness with exam, no odor noted.Wet prep moderate clue cells, many bacteria. UA: trace blood, 0-2 RBCs, few bacteria, few mucus, moderate amorphous sediment. Urine culture pending.   Bacterial Vaginosis  Plan: Metrogel [id number], apply 1 applicator vaginally each night for 5 days. Discussed alcohol precautions. Instructed to call our office if symptoms worsen or do not improve with medication.

## 2016-01-03 LAB — URINE CULTURE: Organism ID, Bacteria: NO GROWTH

## 2016-02-07 DIAGNOSIS — J209 Acute bronchitis, unspecified: Secondary | ICD-10-CM | POA: Diagnosis not present

## 2016-02-07 DIAGNOSIS — J040 Acute laryngitis: Secondary | ICD-10-CM | POA: Diagnosis not present

## 2016-05-05 DIAGNOSIS — F410 Panic disorder [episodic paroxysmal anxiety] without agoraphobia: Secondary | ICD-10-CM | POA: Diagnosis not present

## 2016-05-05 DIAGNOSIS — F900 Attention-deficit hyperactivity disorder, predominantly inattentive type: Secondary | ICD-10-CM | POA: Diagnosis not present

## 2016-05-15 DIAGNOSIS — J101 Influenza due to other identified influenza virus with other respiratory manifestations: Secondary | ICD-10-CM | POA: Diagnosis not present

## 2016-06-25 DIAGNOSIS — L729 Follicular cyst of the skin and subcutaneous tissue, unspecified: Secondary | ICD-10-CM | POA: Diagnosis not present

## 2016-06-25 DIAGNOSIS — Z720 Tobacco use: Secondary | ICD-10-CM | POA: Diagnosis not present

## 2016-10-27 DIAGNOSIS — F900 Attention-deficit hyperactivity disorder, predominantly inattentive type: Secondary | ICD-10-CM | POA: Diagnosis not present

## 2016-10-27 DIAGNOSIS — F410 Panic disorder [episodic paroxysmal anxiety] without agoraphobia: Secondary | ICD-10-CM | POA: Diagnosis not present

## 2016-11-12 DIAGNOSIS — H8110 Benign paroxysmal vertigo, unspecified ear: Secondary | ICD-10-CM | POA: Diagnosis not present

## 2016-11-12 DIAGNOSIS — B349 Viral infection, unspecified: Secondary | ICD-10-CM | POA: Diagnosis not present

## 2016-11-12 DIAGNOSIS — R110 Nausea: Secondary | ICD-10-CM | POA: Diagnosis not present

## 2016-11-17 DIAGNOSIS — B0820 Exanthema subitum [sixth disease], unspecified: Secondary | ICD-10-CM | POA: Diagnosis not present

## 2016-12-27 ENCOUNTER — Other Ambulatory Visit: Payer: Self-pay | Admitting: Gynecology

## 2017-02-25 ENCOUNTER — Ambulatory Visit (INDEPENDENT_AMBULATORY_CARE_PROVIDER_SITE_OTHER): Payer: BLUE CROSS/BLUE SHIELD | Admitting: Gynecology

## 2017-02-25 ENCOUNTER — Encounter: Payer: Self-pay | Admitting: Gynecology

## 2017-02-25 VITALS — BP 120/80 | Ht 65.0 in | Wt 203.0 lb

## 2017-02-25 DIAGNOSIS — Z01411 Encounter for gynecological examination (general) (routine) with abnormal findings: Secondary | ICD-10-CM

## 2017-02-25 DIAGNOSIS — Z13220 Encounter for screening for lipoid disorders: Secondary | ICD-10-CM | POA: Diagnosis not present

## 2017-02-25 DIAGNOSIS — L723 Sebaceous cyst: Secondary | ICD-10-CM

## 2017-02-25 MED ORDER — NORGESTIMATE-ETH ESTRADIOL 0.25-35 MG-MCG PO TABS
1.0000 | ORAL_TABLET | Freq: Every day | ORAL | 12 refills | Status: DC
Start: 2017-02-25 — End: 2017-04-02

## 2017-02-25 NOTE — Patient Instructions (Signed)
Follow-up for Mirena IUD at your choice.  Follow-up in 1 year for annual exam.

## 2017-02-25 NOTE — Progress Notes (Signed)
    [name] Feb 13, 1981 [id number]        36 y.o.  G0P0 for annual gynecologic exam.  Patient has noticed a small bump on the left labia that she wants me to take a look at.  Has been present for years but seems to have gotten a little bit larger.  Also thinking about the Mirena IUD.  Currently on oral contraceptives  Past medical history,surgical history, problem list, medications, allergies, family history and social history were all reviewed and documented as reviewed in the EPIC chart.  ROS:  Performed with pertinent positives and negatives included in the history, assessment and plan.   Additional significant findings : None   Exam: Caryn Bee assistant Vitals:   02/25/17 1514  BP: 120/80  Weight: 203 lb (92.1 kg)  Height: 5\' 5"  (1.651 m)   Body mass index is 33.78 kg/m.  General appearance:  Normal affect, orientation and appearance. Skin: Grossly normal HEENT: Without gross lesions.  No cervical or supraclavicular adenopathy. Thyroid normal.  Lungs:  Clear without wheezing, rales or rhonchi Cardiac: RR, without RMG Abdominal:  Soft, nontender, without masses, guarding, rebound, organomegaly or hernia Breasts:  Examined lying and sitting without masses, retractions, discharge or axillary adenopathy. Pelvic:  Ext, BUS, Vagina: Normal with small classic appearing sebaceous cyst mid left labia majora  Cervix: Normal  Uterus: Anteverted, normal size, shape and contour, midline and mobile nontender   Adnexa: Without masses or tenderness    Anus and perineum: Normal   Rectovaginal: Normal sphincter tone without palpated masses or tenderness.    Assessment/Plan:  36 y.o. G0P0 female for annual gynecologic exam regular menses on oral contraceptives.   1. Contraception.  Patient on oral contraceptives.  We discussed risks to include thrombosis such as stroke heart attack DVT noting she continues to smoke.  She is thinking about Mirena IUD.  I reviewed the insertional  process with her and the risks to include perforation/migration requiring surgery to remove, infections to include serious uterine infections and failure with pregnancy.  Patient is going to schedule and follow-up for placement.  I did refill her oral contraceptives times 1 year in the event that she changes her mind.  She does note that as she continues to smoke we will need to switch her birth control in another year or 2. 2. Small sebaceous cyst left mid labia majora.  Classic in appearance.  Options for management to include lancing versus observation reviewed.  Patient prefers monitoring.  She will follow-up if it enlarges or becomes symptomatic. 3. Pap smear/HPV 2015 negative.  No Pap smear done today.  History of LEEP for HGSIL 2010 with free margins.  Follow-up Pap smears negative.  We will plan repeat Pap smear at 5-year interval per current screening guidelines. 4. Breast health.  SBE monthly reviewed. 5. Health maintenance.  Future orders for fasting CBC, CMP and lipid profile placed.  Follow-up for Mirena IUD.  Follow-up in 1 year for annual exam.   [name] MD, 3:35 PM 02/25/2017

## 2017-03-03 ENCOUNTER — Telehealth: Payer: Self-pay | Admitting: *Deleted

## 2017-03-03 NOTE — Telephone Encounter (Signed)
Patient was informed Mirena IUD is covered 100% cycle started 03/02/17, would like to come in this week to have inserted, but due to holiday and scheduling no time this week. Pt cycle due end on Sunday, pt would like to have Mirena IUD inserted before the end of December as she will be changing insurance. I did explained she can wait until next cycle, her concern is dec cycle maybe during holidays as well. . She asked if IUD could be placed without cycle? Please advise

## 2017-03-03 NOTE — Telephone Encounter (Signed)
She could come in next week and we could do this as long as she does not have intercourse from her last normal menses.

## 2017-03-04 NOTE — Telephone Encounter (Signed)
Pt scheduled, pt aware

## 2017-03-04 NOTE — Telephone Encounter (Signed)
Left message for pt to call.

## 2017-03-09 ENCOUNTER — Encounter: Payer: Self-pay | Admitting: Gynecology

## 2017-03-09 ENCOUNTER — Ambulatory Visit (INDEPENDENT_AMBULATORY_CARE_PROVIDER_SITE_OTHER): Payer: BLUE CROSS/BLUE SHIELD | Admitting: Gynecology

## 2017-03-09 VITALS — BP 118/72

## 2017-03-09 DIAGNOSIS — Z30430 Encounter for insertion of intrauterine contraceptive device: Secondary | ICD-10-CM | POA: Diagnosis not present

## 2017-03-09 HISTORY — PX: INTRAUTERINE DEVICE INSERTION: SHX323

## 2017-03-09 NOTE — Patient Instructions (Signed)

## 2017-03-09 NOTE — Progress Notes (Signed)
    [name] October 30, 1980 [id number]        36 y.o.  G0P0  presents for Mirena IUD placement. She has read through the booklet, has no contraindications and signed the consent form.  Just finished her menses.  Has not had intercourse..  I reviewed the insertional process with her as well as the risks to include infection, either immediate or long-term, uterine perforation or migration requiring surgery to remove, other complications such as pain, hormonal side effects, infertility and possibility of failure with subsequent pregnancy.   Exam with Caryn Bee assistant Vitals:   03/09/17 1542  BP: 118/72    Pelvic: External BUS vagina normal. Cervix normal. Uterus anteverted normal size shape contour midline mobile nontender. Adnexa without masses or tenderness.  Procedure: The cervix was cleansed with Betadine, anterior lip grasped with a single-tooth tenaculum, the uterus was sounded and a Mirena IUD was placed according to manufacturer's recommendations without difficulty. The strings were trimmed. The patient tolerated well and will follow up in one month for a postinsertional check.  Lot number:  [id number]    [name] MD, 4:05 PM 03/09/2017

## 2017-03-24 ENCOUNTER — Telehealth: Payer: Self-pay

## 2017-03-24 MED ORDER — METRONIDAZOLE 500 MG PO TABS: 500.0000 mg | ORAL_TABLET | Freq: Two times a day (BID) | ORAL | 0 refills | Status: DC

## 2017-03-24 NOTE — Telephone Encounter (Signed)
Recommend Flagyl 500 mg twice daily times avoid alcohol while taking.  Office visit if continues.

## 2017-03-24 NOTE — Telephone Encounter (Signed)
Patient informed. Rx sent.( I conferred with Gretta Arab. , CMA Regarding x 7 days).

## 2017-03-24 NOTE — Telephone Encounter (Signed)
Patient had IUD inserted mid November. She said the very evening she had it inserted she noticed an odor vaginally. It started that evening and has persisted. She said the last two days she has vaginal discharge as well. She said the odor is very "fishy" smelling.  Rec?

## 2017-03-25 NOTE — Telephone Encounter (Signed)
rx called in. KW CMA

## 2017-04-02 ENCOUNTER — Encounter: Payer: Self-pay | Admitting: Gynecology

## 2017-04-02 ENCOUNTER — Ambulatory Visit (INDEPENDENT_AMBULATORY_CARE_PROVIDER_SITE_OTHER): Payer: BLUE CROSS/BLUE SHIELD | Admitting: Gynecology

## 2017-04-02 VITALS — BP 130/80

## 2017-04-02 DIAGNOSIS — Z30431 Encounter for routine checking of intrauterine contraceptive device: Secondary | ICD-10-CM

## 2017-04-02 NOTE — Patient Instructions (Signed)
Follow-up if any issues or concerns about the IUD.  Follow-up in 1 year for annual exam when due.

## 2017-04-02 NOTE — Progress Notes (Signed)
    [name] 1980/12/07 [id number]        36 y.o.  G0P0 for IUD check.  Has had some cramping on and off and spotting since placement but this seems to be getting better.  Past medical history,surgical history, problem list, medications, allergies, family history and social history were all reviewed and documented in the EPIC chart.  Directed ROS with pertinent positives and negatives documented in the history of present illness/assessment and plan.  Exam: Caryn Bee assistant Vitals:   04/02/17 1433  BP: 130/80   General appearance:  Normal Abdomen soft nontender without masses guarding rebound Pelvic external BUS vagina normal.  Cervix normal.  IUD string visualized and trimmed.  Uterus normal size midline mobile nontender.  Adnexa without masses or tenderness.  Assessment/Plan:  36 y.o. G0P0 with normal IUD check.  Having some cramping and spotting but reports this is getting better.  No spotting noted today.  Will follow for now and assuming this totally resolves she will follow-up next year when due for annual exam.  Sooner if any issues or spotting/cramping continues.    [name] MD, 2:49 PM 04/02/2017

## 2017-04-15 ENCOUNTER — Encounter: Payer: BLUE CROSS/BLUE SHIELD | Admitting: Gynecology

## 2017-04-17 ENCOUNTER — Encounter: Payer: BLUE CROSS/BLUE SHIELD | Admitting: Gynecology

## 2017-05-06 DIAGNOSIS — F900 Attention-deficit hyperactivity disorder, predominantly inattentive type: Secondary | ICD-10-CM | POA: Diagnosis not present

## 2017-05-06 DIAGNOSIS — F410 Panic disorder [episodic paroxysmal anxiety] without agoraphobia: Secondary | ICD-10-CM | POA: Diagnosis not present

## 2017-07-16 ENCOUNTER — Other Ambulatory Visit: Payer: BLUE CROSS/BLUE SHIELD

## 2017-07-16 DIAGNOSIS — Z13220 Encounter for screening for lipoid disorders: Secondary | ICD-10-CM | POA: Diagnosis not present

## 2017-07-16 DIAGNOSIS — Z01411 Encounter for gynecological examination (general) (routine) with abnormal findings: Secondary | ICD-10-CM

## 2017-07-16 LAB — CBC WITH DIFFERENTIAL/PLATELET
BASOS ABS: 30 {cells}/uL (ref 0–200)
Basophils Relative: 0.3 %
Eosinophils Absolute: 91 cells/uL (ref 15–500)
Eosinophils Relative: 0.9 %
HEMATOCRIT: 39.5 % (ref 35.0–45.0)
Hemoglobin: 13.2 g/dL (ref 11.7–15.5)
LYMPHS ABS: 2555 {cells}/uL (ref 850–3900)
MCH: 28.1 pg (ref 27.0–33.0)
MCHC: 33.4 g/dL (ref 32.0–36.0)
MCV: 84.2 fL (ref 80.0–100.0)
MPV: 9 fL (ref 7.5–12.5)
Monocytes Relative: 6.4 %
NEUTROS PCT: 67.1 %
Neutro Abs: 6777 cells/uL (ref 1500–7800)
Platelets: 530 10*3/uL — ABNORMAL HIGH (ref 140–400)
RBC: 4.69 10*6/uL (ref 3.80–5.10)
RDW: 13.2 % (ref 11.0–15.0)
Total Lymphocyte: 25.3 %
WBC mixed population: 646 cells/uL (ref 200–950)
WBC: 10.1 10*3/uL (ref 3.8–10.8)

## 2017-07-16 LAB — COMPREHENSIVE METABOLIC PANEL
AG RATIO: 1.6 (calc) (ref 1.0–2.5)
ALBUMIN MSPROF: 4.6 g/dL (ref 3.6–5.1)
ALT: 17 U/L (ref 6–29)
AST: 17 U/L (ref 10–30)
Alkaline phosphatase (APISO): 69 U/L (ref 33–115)
BUN: 9 mg/dL (ref 7–25)
CO2: 27 mmol/L (ref 20–32)
Calcium: 9.6 mg/dL (ref 8.6–10.2)
Chloride: 104 mmol/L (ref 98–110)
Creat: 0.62 mg/dL (ref 0.50–1.10)
Globulin: 2.9 g/dL (calc) (ref 1.9–3.7)
Glucose, Bld: 96 mg/dL (ref 65–99)
POTASSIUM: 4.8 mmol/L (ref 3.5–5.3)
SODIUM: 139 mmol/L (ref 135–146)
TOTAL PROTEIN: 7.5 g/dL (ref 6.1–8.1)
Total Bilirubin: 0.4 mg/dL (ref 0.2–1.2)

## 2017-07-16 LAB — LIPID PANEL
CHOLESTEROL: 163 mg/dL (ref <200)
HDL: 41 mg/dL — ABNORMAL LOW (ref >50)
LDL Cholesterol (Calc): 101 mg/dL (calc) — ABNORMAL HIGH
Non-HDL Cholesterol (Calc): 122 mg/dL (calc) (ref <130)
Total CHOL/HDL Ratio: 4 (calc) (ref <5.0)
Triglycerides: 110 mg/dL (ref <150)

## 2017-07-20 ENCOUNTER — Telehealth: Payer: Self-pay

## 2017-07-20 NOTE — Telephone Encounter (Signed)
I forgot about this.  Since she has seen them for thrombocytosis and they evaluated her for this in the past then I do not think she needs to see them now.  Her numbers are not dramatically different than they were before.  They did attributed to iron deficiency in the past.  I would recommend that the patient supplement daily with an OTC iron but otherwise nothing different.

## 2017-07-20 NOTE — Telephone Encounter (Signed)
Patient advised. Patient wanted me to remind you that you referred her to Hematology for this back in 2014. 07/28/2012, 10/10/2012 and resulted in multiple iron infusions. She said she is fine with going back to see them if you still want her to.

## 2017-07-20 NOTE — Telephone Encounter (Signed)
-----   Message from [name], MD sent at 07/20/2017  9:14 AM EDT ----- Tell patient that her platelet count is running a little high.  It has always been on the high side but this is the highest is measured.  The remainder of her CBC is completely normal.  I think this is just a physiologic change and that some people run higher or lower values as a norm but I do want to run her by the hematologists just for an initial review and recommendation if they think anything further needs to be done.  It is not anything of major concern at all but I think it is worth getting their input at least once.  Set up an appointment with hematology reference thrombocytosis at patient's convenience.

## 2017-07-20 NOTE — Telephone Encounter (Signed)
I advised patient. Patient said that she cannot remember if she took OTC iron and it did not work or Dr. Marin Olp told her it would be of no help but that is why she had to have iron infusions. She has noticed lately that she feels tired and "discombobulated" at times. She said that is why she had the lab work checked.

## 2017-07-21 NOTE — Telephone Encounter (Signed)
At next annual exam

## 2017-07-21 NOTE — Telephone Encounter (Signed)
Patient knew I would only call her back if follow up sooner than annual exam.

## 2017-07-21 NOTE — Telephone Encounter (Signed)
Patient informed. She asked is this something you will want to recheck?

## 2017-07-21 NOTE — Telephone Encounter (Signed)
Her blood count is excellent with a hemoglobin of 13 so she is not significantly iron deficient but I think supplementing with iron will help prevent becoming so.  Some people do not absorb iron well and I think this is what Dr. Marin Olp was addressing with the IV infusions.  Still does not hurt though to present the body with some iron to absorb what it can.

## 2017-09-04 ENCOUNTER — Ambulatory Visit (INDEPENDENT_AMBULATORY_CARE_PROVIDER_SITE_OTHER): Payer: BLUE CROSS/BLUE SHIELD | Admitting: Family Medicine

## 2017-09-04 ENCOUNTER — Encounter: Payer: Self-pay | Admitting: Family Medicine

## 2017-09-04 VITALS — BP 110/78 | HR 107 | Ht 64.5 in | Wt 207.4 lb

## 2017-09-04 DIAGNOSIS — F3342 Major depressive disorder, recurrent, in full remission: Secondary | ICD-10-CM

## 2017-09-04 DIAGNOSIS — G4730 Sleep apnea, unspecified: Secondary | ICD-10-CM

## 2017-09-04 DIAGNOSIS — N907 Vulvar cyst: Secondary | ICD-10-CM

## 2017-09-04 DIAGNOSIS — D500 Iron deficiency anemia secondary to blood loss (chronic): Secondary | ICD-10-CM

## 2017-09-04 NOTE — Assessment & Plan Note (Signed)
Increased fatigue and observed apnea at home, will refer for sleep study.

## 2017-09-04 NOTE — Assessment & Plan Note (Signed)
Stable, she'll continue to follow with her psychiatrist.

## 2017-09-04 NOTE — Assessment & Plan Note (Signed)
Recent CBC reviewed and wnl Recommend that she continue iron supplement, take with vitamin c or citrus to help with absorption.

## 2017-09-04 NOTE — Progress Notes (Signed)
Patient ID: [name], female   DOB: 09-30-1980, 37 y.o.   MRN: [id number]  [name] - 37 y.o. female MRN [id number]  Date of birth: 1981/04/08  Subjective Chief Complaint  Patient presents with  . Cyst    HPI [name] is a 37 y.o. female with a history of iron deficiency anemia, depression and ADHD here today to establish care with new pcp.  She has complaint of labial cyst as well as occasional sensation into her R arm.  She is followed by a psychiatrist (Dr. Toy Care) for management of medications for depression and ADHD.  -Labial cyst:  Noted labial cyst in 01/2017.  Reports that this swells and drains intermittently and she would like to have this removed.  Denies pain or swelling at this time but notices that it is there and wants it removed.  Was evaluated by her GYN in 02/2017 at routine appt however she doesn't recall them evaluating this.  Denies fever, chills.   -L arm tingling:  Sensation of tingling down R arm.  Describes this as someone lightly brushing from her shoulder all the way down her arm.  Denies weakness or numbness. This tends to worsen when she is under more stress and has more tension in her neck.  No associated rash noted with this.   -Iron deficiency anemia:  Previously followed by hematology requiring iron infusion. Has not been taking oral iron supplement as she didn't feel that it helped a whole lot previously.  Last CBC with hgb of 13.2 in 07/2017.  She did have elevated platelets at that time.  She does have some continued fatigue and reports that several friends have told her that she stops breathing while sleeping.  She would like to have a sleep study   ROS: ROS completed and negative except as noted per HPI No Known Allergies  Past Medical History:  Diagnosis Date  . Allergy   . CIN III (cervical intraepithelial neoplasia grade III) with severe dysplasia 11/2008   LEEP-D&C-HYSTEROSCOPY-SHOWED CIN II AND CIN III  . Depression   . Iron  deficiency anemia secondary to blood loss (chronic) 07/29/2012  . Migraines     Past Surgical History:  Procedure Laterality Date  . ANTERIOR CRUCIATE LIGAMENT REPAIR  1998   LEFT  . CERVICAL BIOPSY  W/ LOOP ELECTRODE EXCISION  11/2008   CIN 3 free margins  . COLPOSCOPY    . INTRAUTERINE DEVICE INSERTION  03/09/2017   Mirena  . KNEE ARTHROCENTESIS  2006  . MOUTH SURGERY    . PILONIDAL CYST EXCISION  1999    Social History   Socioeconomic History  . Marital status: Single    Spouse name: Not on file  . Number of children: Not on file  . Years of education: Not on file  . Highest education level: Not on file  Occupational History  . Not on file  Social Needs  . Financial resource strain: Not on file  . Food insecurity:    Worry: Not on file    Inability: Not on file  . Transportation needs:    Medical: Not on file    Non-medical: Not on file  Tobacco Use  . Smoking status: Current Every Day Smoker    Packs/day: 0.50    Types: Cigarettes  . Smokeless tobacco: Never Used  Substance and Sexual Activity  . Alcohol use: Not Currently    Alcohol/week: 0.0 oz    Comment: Rare  . Drug use: No  . Sexual  activity: Yes    Partners: Male    Birth control/protection: IUD    Comment: 1st intercourse 37 yo-Fewer than 5 partners Mirena 03/09/2017  Lifestyle  . Physical activity:    Days per week: Not on file    Minutes per session: Not on file  . Stress: Not on file  Relationships  . Social connections:    Talks on phone: Not on file    Gets together: Not on file    Attends religious service: Not on file    Active member of club or organization: Not on file    Attends meetings of clubs or organizations: Not on file    Relationship status: Not on file  Other Topics Concern  . Not on file  Social History Narrative  . Not on file    Family History  Problem Relation Age of Onset  . Diabetes Father   . Hypertension Father   . Hyperlipidemia Father   . Lupus Mother     . Heart disease Paternal Grandfather   . Cancer Paternal Grandfather        Prostate    Health Maintenance  Topic Date Due  . PAP SMEAR  06/22/2016  . INFLUENZA VACCINE  11/12/2017  . TETANUS/TDAP  04/14/2026  . HIV Screening  Completed    ----------------------------------------------------------------------------------------------------------------------------------------------------------------------------------------------------------------- Physical Exam BP 110/78   Pulse (!) 107   Ht 5' 4.5" (1.638 m)   Wt 207 lb 6.4 oz (94.1 kg)   BMI 35.05 kg/m   Physical Exam  Constitutional: She is oriented to person, place, and time. She appears well-nourished. No distress.  HENT:  Head: Normocephalic and atraumatic.  Mouth/Throat: Oropharynx is clear and moist.  Eyes: No scleral icterus.  Neck: Neck supple. No thyromegaly present.  Cardiovascular: Normal rate, regular rhythm and normal heart sounds.  Pulmonary/Chest: Effort normal and breath sounds normal.  Genitourinary:  Genitourinary Comments: Prefers to defer to GYN  Musculoskeletal:  Hypertonicity of upper trapezius, especially on R.    Lymphadenopathy:    She has no cervical adenopathy.  Neurological: She is alert and oriented to person, place, and time.  Skin: Skin is warm and dry. No rash noted.  Psychiatric: She has a normal mood and affect. Her behavior is normal.    ------------------------------------------------------------------------------------------------------------------------------------------------------------------------------------------------------------------- Assessment and Plan  Observed sleep apnea Increased fatigue and observed apnea at home, will refer for sleep study.   Labial cyst Not painful or draining at this time, I&D would not be helpful Referral back to her GYN for removal given that it continues to be bothersome for her.   Depression Stable, she'll continue to follow with her  psychiatrist.    Iron deficiency anemia secondary to blood loss (chronic) Recent CBC reviewed and wnl Recommend that she continue iron supplement, take with vitamin c or citrus to help with absorption.

## 2017-09-04 NOTE — Patient Instructions (Addendum)
It was very nice to meet you! I would recommend restarting iron supplement-take with vitamin c or citrus juice to help with absorption We'll check these levels in 4-5 months.

## 2017-09-04 NOTE — Assessment & Plan Note (Signed)
Not painful or draining at this time, I&D would not be helpful Referral back to her GYN for removal given that it continues to be bothersome for her.

## 2017-09-08 ENCOUNTER — Encounter: Payer: Self-pay | Admitting: Family Medicine

## 2017-09-21 ENCOUNTER — Ambulatory Visit (INDEPENDENT_AMBULATORY_CARE_PROVIDER_SITE_OTHER): Payer: BLUE CROSS/BLUE SHIELD | Admitting: Gynecology

## 2017-09-21 ENCOUNTER — Encounter: Payer: Self-pay | Admitting: Gynecology

## 2017-09-21 ENCOUNTER — Telehealth: Payer: Self-pay | Admitting: *Deleted

## 2017-09-21 VITALS — BP 118/76

## 2017-09-21 DIAGNOSIS — L02224 Furuncle of groin: Secondary | ICD-10-CM

## 2017-09-21 MED ORDER — DOXYCYCLINE HYCLATE 100 MG PO CAPS: 100.0000 mg | ORAL_CAPSULE | Freq: Two times a day (BID) | ORAL | 0 refills | Status: DC

## 2017-09-21 NOTE — Telephone Encounter (Signed)
Referral placed,staff message sent to referral coordinator to schedule and let me know time and date.

## 2017-09-21 NOTE — Addendum Note (Signed)
Addended by: Nelva Nay on: 09/21/2017 11:18 AM   Modules accepted: Orders

## 2017-09-21 NOTE — Patient Instructions (Signed)
Take the antibiotic twice daily for 7 days.  General surgery office should contact you to make an appointment.  If you do not hear from them within the next 1 to 2 weeks call my office.

## 2017-09-21 NOTE — Progress Notes (Signed)
    [name] 13-Oct-1980 [id number]        37 y.o.  G0P0 presents complaining of a chronically draining area in her left groin area.  She has a boil-like area that flares on and off since this past fall with increasing pain, subsequent drainage and then resolution of her symptoms.  It never seems to completely go away.  No fever or chills.  History of antibiotic treatment in the past.  Past medical history,surgical history, problem list, medications, allergies, family history and social history were all reviewed and documented in the EPIC chart.  Directed ROS with pertinent positives and negatives documented in the history of present illness/assessment and plan.  Exam: Caryn Bee assistant Vitals:   09/21/17 1051  BP: 118/76   General appearance:  Normal BUS vagina with draining fluctuant 2 cm elongated area at the crease of her upper inner left thigh and pubic area as diagrammed.  No evidence of surrounding cellulitis or inguinal adenopathy.  Assessment/Plan:  37 y.o. G0P0 with chronically draining area left groin.  I did a culture over the draining area rule out MRSA.  Will start on doxycycline 100 mg twice daily x7 days.  Recommend follow-up with general surgeon to consider a more aggressive cleanout of this area.  Patient agrees with the plan and will go ahead and refer her for evaluation.    [name] MD, 11:10 AM 09/21/2017

## 2017-09-21 NOTE — Telephone Encounter (Signed)
-----   Message from [name], MD sent at 09/21/2017 11:13 AM EDT ----- Appointment with general surgeon reference chronically draining left inguinal boil-like area

## 2017-09-22 NOTE — Telephone Encounter (Signed)
Per Judson Roch staff message:  "Left vm for patient 09/29/17 arrive 10:30 for 11:00 appt with Dr.Blackman"

## 2017-09-25 LAB — WOUND CULTURE
MICRO NUMBER: 90693418
SPECIMEN QUALITY:: ADEQUATE

## 2017-10-13 ENCOUNTER — Other Ambulatory Visit: Payer: Self-pay | Admitting: Surgery

## 2017-10-13 DIAGNOSIS — L089 Local infection of the skin and subcutaneous tissue, unspecified: Secondary | ICD-10-CM | POA: Diagnosis not present

## 2017-10-13 DIAGNOSIS — L723 Sebaceous cyst: Secondary | ICD-10-CM | POA: Diagnosis not present

## 2017-10-20 DIAGNOSIS — L720 Epidermal cyst: Secondary | ICD-10-CM | POA: Diagnosis not present

## 2017-10-28 DIAGNOSIS — F900 Attention-deficit hyperactivity disorder, predominantly inattentive type: Secondary | ICD-10-CM | POA: Diagnosis not present

## 2017-10-28 DIAGNOSIS — F410 Panic disorder [episodic paroxysmal anxiety] without agoraphobia: Secondary | ICD-10-CM | POA: Diagnosis not present

## 2017-11-09 ENCOUNTER — Ambulatory Visit (INDEPENDENT_AMBULATORY_CARE_PROVIDER_SITE_OTHER): Payer: BLUE CROSS/BLUE SHIELD | Admitting: Neurology

## 2017-11-09 ENCOUNTER — Encounter: Payer: Self-pay | Admitting: Neurology

## 2017-11-09 VITALS — BP 125/88 | HR 82 | Ht 64.0 in | Wt 202.0 lb

## 2017-11-09 DIAGNOSIS — R400 Somnolence: Secondary | ICD-10-CM | POA: Diagnosis not present

## 2017-11-09 DIAGNOSIS — R0683 Snoring: Secondary | ICD-10-CM

## 2017-11-09 DIAGNOSIS — R0681 Apnea, not elsewhere classified: Secondary | ICD-10-CM

## 2017-11-09 DIAGNOSIS — E669 Obesity, unspecified: Secondary | ICD-10-CM | POA: Diagnosis not present

## 2017-11-09 DIAGNOSIS — R51 Headache: Secondary | ICD-10-CM

## 2017-11-09 DIAGNOSIS — R519 Headache, unspecified: Secondary | ICD-10-CM

## 2017-11-09 DIAGNOSIS — G4734 Idiopathic sleep related nonobstructive alveolar hypoventilation: Secondary | ICD-10-CM

## 2017-11-09 DIAGNOSIS — Z820 Family history of epilepsy and other diseases of the nervous system: Secondary | ICD-10-CM

## 2017-11-09 DIAGNOSIS — G4761 Periodic limb movement disorder: Secondary | ICD-10-CM

## 2017-11-09 DIAGNOSIS — M26609 Unspecified temporomandibular joint disorder, unspecified side: Secondary | ICD-10-CM

## 2017-11-09 NOTE — Progress Notes (Signed)
Subjective:    Patient ID: [name] is a 37 y.o. female.  HPI     Star Age, MD, PhD [facility] [address], [address] [place], [place] [address]  Dear Dr. Zigmund Daniel,   I saw your patient, [name], upon your kind request in my neurologic clinic today for initial consultation of her sleep disorder, in particular, concern for underlying obstructive sleep apnea. The patient is unaccompanied today. As you know, [name] is a 37 year old right-handed woman with an underlying medical history of anemia, depression, ADHD, arthritis, allergies and obesity, who reports snoring and excessive daytime somnolence. I reviewed your office note from 09/04/2017. Her Epworth sleepiness score is 17 out of 24, fatigue score is 46 out of 63. Her father has sleep apnea and had sleep apnea surgery but also uses a CPAP machine. She has never had a tonsillectomy, she recently had an overnight stay in the hospital after a cyst removal, at which time she was noted to have desaturations during sleep requiring overnight oxygen. Her snoring has become worse. Her sleepiness has become worse. However, she reports that she was sleepy when she was a teenager or while in college. She has gained weight with time. She has been witnessed to have pauses in her breathing by previous boyfriends. She is single, lives alone, no children. She has 1 dog, one cat. 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 At time is around midnight or 1 AM, rise time around 7 or 7:30 AM. She does not have night to night nocturia but has woken up with a headache occasionally, and has a history of intermittent migraine headaches, managed primarily with over-the-counter medications as needed. She has been on clonazepam as needed, reports taking it infrequently but in the past was taking it daily for sleep. She has been on Adderall 3 times a day for her ADD. She has gained weight in the past 2 months  or so. She has a remote history of difficulty with TMJ bilaterally. She has a history of restless sleep and twitching in her sleep. She admits that she does not take her Lexapro on a regular basis and has noted less twitching since she has been off the Lexapro.   Marland Kitchen  Her Past Medical History Is Significant For: Past Medical History:  Diagnosis Date  . Allergy   . Arthritis   . CIN III (cervical intraepithelial neoplasia grade III) with severe dysplasia 11/2008   LEEP-D&C-HYSTEROSCOPY-SHOWED CIN II AND CIN III  . Depression   . Iron deficiency anemia secondary to blood loss (chronic) 07/29/2012  . Migraines     Her Past Surgical History Is Significant For: Past Surgical History:  Procedure Laterality Date  . ANTERIOR CRUCIATE LIGAMENT REPAIR  1998   LEFT  . CERVICAL BIOPSY  W/ LOOP ELECTRODE EXCISION  11/2008   CIN 3 free margins  . COLPOSCOPY    . INTRAUTERINE DEVICE INSERTION  03/09/2017   Mirena  . KNEE ARTHROCENTESIS  2006  . MOUTH SURGERY    . PILONIDAL CYST EXCISION  1999    Her Family History Is Significant For: Family History  Problem Relation Age of Onset  . Diabetes Father   . Hypertension Father   . Hyperlipidemia Father   . Lupus Mother   . Heart disease Paternal Grandfather   . Cancer Paternal Grandfather        Prostate   Her Social History Is Significant For: Social History   Socioeconomic  History  . Marital status: Single    Spouse name: Not on file  . Number of children: Not on file  . Years of education: Not on file  . Highest education level: Not on file  Occupational History  . Not on file  Social Needs  . Financial resource strain: Not on file  . Food insecurity:    Worry: Not on file    Inability: Not on file  . Transportation needs:    Medical: Not on file    Non-medical: Not on file  Tobacco Use  . Smoking status: Current Every Day Smoker    Packs/day: 0.50    Types: Cigarettes  . Smokeless tobacco: Never Used  Substance and  Sexual Activity  . Alcohol use: Not Currently    Alcohol/week: 0.0 oz    Comment: Rare  . Drug use: No  . Sexual activity: Yes    Partners: Male    Birth control/protection: IUD    Comment: 1st intercourse 37 yo-Fewer than 5 partners Mirena 03/09/2017  Lifestyle  . Physical activity:    Days per week: Not on file    Minutes per session: Not on file  . Stress: Not on file  Relationships  . Social connections:    Talks on phone: Not on file    Gets together: Not on file    Attends religious service: Not on file    Active member of club or organization: Not on file    Attends meetings of clubs or organizations: Not on file    Relationship status: Not on file  Other Topics Concern  . Not on file  Social History Narrative  . Not on file    Her Allergies Are:  No Known Allergies:   Her Current Medications Are:  Outpatient Encounter Medications as of 11/09/2017  Medication Sig  . Amphetamine-Dextroamphetamine (ADDERALL PO) Take 20 mg by mouth 3 (three) times daily.   . clonazePAM (KLONOPIN) 0.5 MG tablet Take 0.5 mg by mouth as needed for anxiety. TAKES 0.25  . escitalopram (LEXAPRO) 20 MG tablet Take 1 tablet by mouth daily. Reported on 04/25/2015  . levonorgestrel (MIRENA) 20 MCG/24HR IUD 1 each by Intrauterine route once.  . [DISCONTINUED] doxycycline (VIBRAMYCIN) 100 MG capsule Take 1 capsule (100 mg total) by mouth 2 (two) times daily.   No facility-administered encounter medications on file as of 11/09/2017.   :  Review of Systems:  Out of a complete 14 point review of systems, all are reviewed and negative with the exception of these symptoms as listed below: Review of Systems  Neurological:       Pt presents today to discuss her sleep. Pt has never had a sleep study but does endorse snoring.  Epworth Sleepiness Scale 0= would never doze 1= slight chance of dozing 2= moderate chance of dozing 3= high chance of dozing  Sitting and reading: 3 Watching TV: 2 Sitting  inactive in a public place (ex. Theater or meeting): 2 As a passenger in a car for an hour without a break: 3 Lying down to rest in the afternoon: 3 Sitting and talking to someone: 1 Sitting quietly after lunch (no alcohol): 3 In a car, while stopped in traffic: 0 Total: 17     Objective:  Neurological Exam  Physical Exam Physical Examination:   Vitals:   11/09/17 1617  BP: 125/88  Pulse: 82    General Examination: The patient is a very pleasant 37 y.o. female in no acute distress. She  appears well-developed and well-nourished and well groomed.   HEENT: Normocephalic, atraumatic, pupils are equal, round and reactive to light and accommodation. Extraocular tracking is good without limitation to gaze excursion or nystagmus noted. Normal smooth pursuit is noted. Hearing is grossly intact. Face is symmetric with normal facial animation and normal facial sensation. Speech is clear with no dysarthria noted. There is no hypophonia. There is no lip, neck/head, jaw or voice tremor. Neck is supple with full range of passive and active motion. There are no carotid bruits on auscultation. Oropharynx exam reveals: mild mouth dryness, good dental hygiene and mild airway crowding, due to smaller airway entry. Mallampati is class I. Tongue protrudes centrally and palate elevates symmetrically. Tonsils are small. Neck size is 15 7/8 inches. She has a minimal overbite. Nasal inspection reveals no significant nasal mucosal bogginess or redness and no septal deviation.   Chest: Clear to auscultation without wheezing, rhonchi or crackles noted.  Heart: S1+S2+0, regular and normal without murmurs, rubs or gallops noted.   Abdomen: Soft, non-tender and non-distended with normal bowel sounds appreciated on auscultation.  Extremities: There is no pitting edema in the distal lower extremities bilaterally.  Skin: Warm and dry without trophic changes noted.  Musculoskeletal: exam reveals no obvious joint  deformities, tenderness or joint swelling or erythema.   Neurologically:  Mental status: The patient is awake, alert and oriented in all 4 spheres. Her immediate and remote memory, attention, language skills and fund of knowledge are appropriate. There is no evidence of aphasia, agnosia, apraxia or anomia. Speech is clear with normal prosody and enunciation. Thought process is linear. Mood is normal and affect is normal.  Cranial nerves II - XII are as described above under HEENT exam. In addition: shoulder shrug is normal with equal shoulder height noted. Motor exam: Normal bulk, strength and tone is noted. There is no drift, tremor or rebound. Romberg is negative. Reflexes are 2+ throughout. Fine motor skills and coordination: grossly intact.  Cerebellar testing: No dysmetria or intention tremor. There is no truncal or gait ataxia.  Sensory exam: intact to light touch in the upper and lower extremities.  Gait, station and balance: She stands easily. No veering to one side is noted. No leaning to one side is noted. Posture is age-appropriate and stance is narrow based. Gait shows normal stride length and normal pace. No problems turning are noted. Tandem walk is unremarkable.   Assessment and Plan:  In summary, Alishea Beaudin is a very pleasant 37 y.o.-year old female with an underlying medical history of anemia, depression, ADHD, arthritis, allergies and obesity, whose history and physical exam are concerning for obstructive sleep apnea (OSA). She reports twitching of her feet/legs while asleep, less since she has not been consistent with the lexapro. I had a long chat with the patient about my findings and the diagnosis of OSA, its prognosis and treatment options. We talked about medical treatments, surgical interventions and non-pharmacological approaches. I explained in particular the risks and ramifications of untreated moderate to severe OSA, especially with respect to developing cardiovascular  disease down the Road, including congestive heart failure, difficult to treat hypertension, cardiac arrhythmias, or stroke. Even type 2 diabetes has, in part, been linked to untreated OSA. Symptoms of untreated OSA include daytime sleepiness, memory problems, mood irritability and mood disorder such as depression and anxiety, lack of energy, as well as recurrent headaches, especially morning headaches. We talked about smoking cessation and trying to maintain a healthy lifestyle in general, as well  as the importance of weight control. I encouraged the patient to eat healthy, exercise daily and keep well hydrated, to keep a scheduled bedtime and wake time routine, to not skip any meals and eat healthy snacks in between meals. I advised the patient not to drive when feeling sleepy. I recommended the following at this time: sleep study with potential positive airway pressure titration. (We will score hypopneas at 3%).   I explained the sleep test procedure to the patient and also outlined possible surgical and non-surgical treatment options of OSA, including the use of a custom-made dental device (which would require a referral to a specialist dentist or oral surgeon), upper airway surgical options, such as pillar implants, radiofrequency surgery, tongue base surgery, and UPPP (which would involve a referral to an ENT surgeon). Rarely, jaw surgery such as mandibular advancement may be considered.  I also explained the CPAP treatment option to the patient, who indicated that she would be willing to try CPAP if the need arises. I explained the importance of being compliant with PAP treatment, not only for insurance purposes but primarily to improve her symptoms, and for the patient's long term health benefit, including to reduce Her cardiovascular risks. I answered all her questions today and the patient was in agreement. I plan to see her back after the sleep study is completed and encouraged her to call with any  interim questions, concerns, problems or updates.   Thank you very much for allowing me to participate in the care of this nice patient. If I can be of any further assistance to you please do not hesitate to call me at [phone].  Sincerely,   Star Age, MD, PhD

## 2017-11-09 NOTE — Patient Instructions (Addendum)

## 2017-11-11 ENCOUNTER — Telehealth: Payer: Self-pay

## 2017-11-11 DIAGNOSIS — R400 Somnolence: Secondary | ICD-10-CM

## 2017-11-11 NOTE — Addendum Note (Signed)
Addended by: Lester Schoenchen A on: 11/11/2017 05:07 PM   Modules accepted: Orders

## 2017-11-11 NOTE — Telephone Encounter (Signed)
Insurance has denied in lab study request. Please order HST. Thanks!

## 2017-11-11 NOTE — Telephone Encounter (Signed)
VO for HST from Dr. Athar received. HST order placed.  

## 2017-11-27 DIAGNOSIS — J029 Acute pharyngitis, unspecified: Secondary | ICD-10-CM | POA: Diagnosis not present

## 2017-11-30 ENCOUNTER — Ambulatory Visit (INDEPENDENT_AMBULATORY_CARE_PROVIDER_SITE_OTHER): Payer: BLUE CROSS/BLUE SHIELD | Admitting: Neurology

## 2017-11-30 DIAGNOSIS — G4733 Obstructive sleep apnea (adult) (pediatric): Secondary | ICD-10-CM

## 2017-11-30 DIAGNOSIS — R400 Somnolence: Secondary | ICD-10-CM

## 2017-12-08 ENCOUNTER — Emergency Department (HOSPITAL_COMMUNITY): Payer: BLUE CROSS/BLUE SHIELD

## 2017-12-08 ENCOUNTER — Encounter (HOSPITAL_COMMUNITY): Payer: Self-pay | Admitting: *Deleted

## 2017-12-08 ENCOUNTER — Other Ambulatory Visit: Payer: Self-pay

## 2017-12-08 ENCOUNTER — Emergency Department (HOSPITAL_COMMUNITY)
Admission: EM | Admit: 2017-12-08 | Discharge: 2017-12-08 | Disposition: A | Discharge status: Home / Self Care | Payer: BLUE CROSS/BLUE SHIELD | Attending: Emergency Medicine | Admitting: Emergency Medicine

## 2017-12-08 DIAGNOSIS — Z79899 Other long term (current) drug therapy: Secondary | ICD-10-CM | POA: Diagnosis not present

## 2017-12-08 DIAGNOSIS — R102 Pelvic and perineal pain: Secondary | ICD-10-CM | POA: Diagnosis not present

## 2017-12-08 DIAGNOSIS — F17210 Nicotine dependence, cigarettes, uncomplicated: Secondary | ICD-10-CM | POA: Diagnosis not present

## 2017-12-08 DIAGNOSIS — R1030 Lower abdominal pain, unspecified: Secondary | ICD-10-CM

## 2017-12-08 DIAGNOSIS — R1031 Right lower quadrant pain: Secondary | ICD-10-CM | POA: Diagnosis not present

## 2017-12-08 DIAGNOSIS — R55 Syncope and collapse: Secondary | ICD-10-CM | POA: Diagnosis not present

## 2017-12-08 LAB — URINALYSIS, ROUTINE W REFLEX MICROSCOPIC
Bilirubin Urine: NEGATIVE
Glucose, UA: NEGATIVE mg/dL
Ketones, ur: NEGATIVE mg/dL
Leukocytes, UA: NEGATIVE
Nitrite: NEGATIVE
PROTEIN: NEGATIVE mg/dL
SPECIFIC GRAVITY, URINE: 1.012 (ref 1.005–1.030)
pH: 8 (ref 5.0–8.0)

## 2017-12-08 LAB — CBC
HEMATOCRIT: 41.4 % (ref 36.0–46.0)
HEMOGLOBIN: 13.5 g/dL (ref 12.0–15.0)
MCH: 28.4 pg (ref 26.0–34.0)
MCHC: 32.6 g/dL (ref 30.0–36.0)
MCV: 87.2 fL (ref 78.0–100.0)
Platelets: 478 10*3/uL — ABNORMAL HIGH (ref 150–400)
RBC: 4.75 MIL/uL (ref 3.87–5.11)
RDW: 13 % (ref 11.5–15.5)
WBC: 9.3 10*3/uL (ref 4.0–10.5)

## 2017-12-08 LAB — WET PREP, GENITAL
CLUE CELLS WET PREP: NONE SEEN
Sperm: NONE SEEN
Trich, Wet Prep: NONE SEEN
YEAST WET PREP: NONE SEEN

## 2017-12-08 LAB — COMPREHENSIVE METABOLIC PANEL
ALBUMIN: 4.1 g/dL (ref 3.5–5.0)
ALK PHOS: 69 U/L (ref 38–126)
ALT: 15 U/L (ref 0–44)
AST: 17 U/L (ref 15–41)
Anion gap: 10 (ref 5–15)
BILIRUBIN TOTAL: 0.6 mg/dL (ref 0.3–1.2)
BUN: 11 mg/dL (ref 6–20)
CO2: 26 mmol/L (ref 22–32)
Calcium: 9.7 mg/dL (ref 8.9–10.3)
Chloride: 104 mmol/L (ref 98–111)
Creatinine, Ser: 0.91 mg/dL (ref 0.44–1.00)
GFR calc Af Amer: >60 mL/min (ref >60)
GFR calc non Af Amer: >60 mL/min (ref >60)
GLUCOSE: 101 mg/dL — AB (ref 70–99)
POTASSIUM: 3.7 mmol/L (ref 3.5–5.1)
SODIUM: 140 mmol/L (ref 135–145)
Total Protein: 7.4 g/dL (ref 6.5–8.1)

## 2017-12-08 LAB — I-STAT BETA HCG BLOOD, ED (MC, WL, AP ONLY)

## 2017-12-08 LAB — LIPASE, BLOOD: Lipase: 27 U/L (ref 11–51)

## 2017-12-08 NOTE — ED Triage Notes (Signed)
Pt in c/o abd pain onset today, pt had near syncopal episode today witnessed by nurse first, pt diaphoretic, pt reports recent vaginal bleeding that she states, "it came from my yeast infection." pt hx of anemia, denies diarrhea, reports vomiting clear liquid once today

## 2017-12-08 NOTE — Discharge Instructions (Addendum)
You are seen today for abdominal pain that resolved on its own.  Lab work, urine, ultrasounds did not find any explanation to your symptoms.  We discussed the possibility of appendicitis however your symptoms do not sound consistent with this.  Monitor your symptoms closely over the next 48 to 72 hours.  Return to the ER for vomiting, nausea, fever, localized abdominal pain to the right upper quadrant or right lower quadrants, burning with urination, urinary frequency or urgency, diarrhea, blood in your stools.  Follow up with your primary care doctor in 1 week for further discussion of these symptoms and your visit today

## 2017-12-08 NOTE — ED Notes (Signed)
Patient transported to Ultrasound 

## 2017-12-08 NOTE — ED Provider Notes (Signed)
[facility] EMERGENCY DEPARTMENT Provider Note   CSN: [id number] Arrival date & time: 12/08/17  1132     History   Chief Complaint Chief Complaint  Patient presents with  . Abdominal Pain    HPI [name] is a 37 y.o. female w/ h/o ovarian cyst, cervical neoplasia s/p LEEP, here for evaluation of abdominal pain.  Abdominal pain started suddenly was severe 12/10, began in diffuse lower abdomen and radiated to umbilicus. Pain described as a strong spasm or menstrual cramp that lasted for 10-15 minutes, now feels sore in lower abdomen.  Associated with nausea. Same symptoms happened Saturday/sunday but not as bad, that also resolved on its own. Recent diagnosis of strep throat treated with abx that she thinks gave her a yeast infection. she had vaginal itching and small bright red vaginal bleeding only after wiping during urination.  She stopped taking abx and itching has resolved however vaginal bleeding with wiping continues.  No blood spotting on clothes, clots or flow. No CP, palpitations, SOB.  IUD. Sexually active with female partners only without condom use. H/o ovarian cyst. No abd surgeries   HPI  Past Medical History:  Diagnosis Date  . Allergy   . Arthritis   . CIN III (cervical intraepithelial neoplasia grade III) with severe dysplasia 11/2008   LEEP-D&C-HYSTEROSCOPY-SHOWED CIN II AND CIN III  . Depression   . Iron deficiency anemia secondary to blood loss (chronic) 07/29/2012  . Migraines     Patient Active Problem List   Diagnosis Date Noted  . Labial cyst 09/04/2017  . Observed sleep apnea 09/04/2017  . Iron deficiency anemia secondary to blood loss (chronic) 07/29/2012  . Depression     Past Surgical History:  Procedure Laterality Date  . ANTERIOR CRUCIATE LIGAMENT REPAIR  1998   LEFT  . CERVICAL BIOPSY  W/ LOOP ELECTRODE EXCISION  11/2008   CIN 3 free margins  . COLPOSCOPY    . INTRAUTERINE DEVICE INSERTION  03/09/2017   Mirena  .  KNEE ARTHROCENTESIS  2006  . MOUTH SURGERY    . PILONIDAL CYST EXCISION  1999     OB History    Gravida  0   Para      Term      Preterm      AB      Living        SAB      TAB      Ectopic      Multiple      Live Births               Home Medications    Prior to Admission medications   Medication Sig Start Date End Date Taking? Authorizing Provider  amphetamine-dextroamphetamine (ADDERALL) 20 MG tablet Take 20 mg by mouth 3 (three) times daily.    Yes [provider]  clonazePAM (KLONOPIN) 0.5 MG tablet Take 0.25 mg by mouth as needed for anxiety.    Yes [provider]  levonorgestrel (MIRENA) 20 MCG/24HR IUD 1 each by Intrauterine route once.   Yes [provider]    Family History Family History  Problem Relation Age of Onset  . Diabetes Father   . Hypertension Father   . Hyperlipidemia Father   . Lupus Mother   . Heart disease Paternal Grandfather   . Cancer Paternal Grandfather        Prostate    Social History Social History   Tobacco Use  . Smoking status: Current  Every Day Smoker    Packs/day: 0.50    Types: Cigarettes  . Smokeless tobacco: Never Used  Substance Use Topics  . Alcohol use: Not Currently    Alcohol/week: 0.0 standard drinks    Comment: Rare  . Drug use: No     Allergies   Patient has no known allergies.   Review of Systems Review of Systems  Gastrointestinal: Positive for abdominal pain and nausea.  Genitourinary: Positive for vaginal bleeding.  Neurological:       Pre syncope  All other systems reviewed and are negative.    Physical Exam Updated Vital Signs BP 112/88   Pulse 74   Temp 98 F (36.7 C) (Oral)   Resp 16   SpO2 97%   Physical Exam  Constitutional: She is oriented to person, place, and time. She appears well-developed and well-nourished. No distress.  NAD.  HENT:  Head: Normocephalic and atraumatic.  Right Ear: External ear normal.  Left Ear: External ear  normal.  Nose: Nose normal.  Eyes: Conjunctivae and EOM are normal.  Neck: Normal range of motion. Neck supple.  Cardiovascular: Normal rate, regular rhythm and normal heart sounds.  Pulmonary/Chest: Effort normal and breath sounds normal.  Abdominal: Normal appearance and bowel sounds are normal.  Soft, non tender. No G/R/R. Negative murphy's and mcburney's. No suprapubic or CVA tenderness.   Musculoskeletal: Normal range of motion. She exhibits no deformity.  Neurological: She is alert and oriented to person, place, and time.  Skin: Skin is warm and dry. Capillary refill takes less than 2 seconds.  Psychiatric: She has a normal mood and affect. Her behavior is normal. Judgment and thought content normal.  Nursing note and vitals reviewed.    ED Treatments / Results  Labs (all labs ordered are listed, but only abnormal results are displayed) Labs Reviewed  WET PREP, GENITAL - Abnormal; Notable for the following components:      Result Value   WBC, Wet Prep HPF POC FEW (*)    All other components within normal limits  COMPREHENSIVE METABOLIC PANEL - Abnormal; Notable for the following components:   Glucose, Bld 101 (*)    All other components within normal limits  CBC - Abnormal; Notable for the following components:   Platelets 478 (*)    All other components within normal limits  URINALYSIS, ROUTINE W REFLEX MICROSCOPIC - Abnormal; Notable for the following components:   APPearance HAZY (*)    Hgb urine dipstick MODERATE (*)    Bacteria, UA RARE (*)    All other components within normal limits  URINE CULTURE  LIPASE, BLOOD  I-STAT BETA HCG BLOOD, ED ([facility], [facility], [facility] ONLY)  GC/CHLAMYDIA PROBE AMP ([place]) NOT AT [facility]    EKG None  Radiology US Transvaginal Non-ob  Result Date: 12/08/2017 CLINICAL DATA:  Sharp pelvic pain, resolved EXAM: TRANSABDOMINAL AND TRANSVAGINAL ULTRASOUND OF PELVIS DOPPLER ULTRASOUND OF OVARIES TECHNIQUE: Both transabdominal and transvaginal  ultrasound examinations of the pelvis were performed. Transabdominal technique was performed for global imaging of the pelvis including uterus, ovaries, adnexal regions, and pelvic cul-de-sac. It was necessary to proceed with endovaginal exam following the transabdominal exam to visualize the endometrium. Color and duplex Doppler ultrasound was utilized to evaluate blood flow to the ovaries. COMPARISON:  06/13/2010 abdominal CT FINDINGS: Uterus Measurements: 7 x 4 x 4 cm.  No fibroids or other mass visualized. Endometrium Thickness: 4 mm where not obscured.  IUD in expected position Right ovary Measurements: 35 x 25 x  36 mm.  Normal appearance/no adnexal mass. Left ovary Measurements: 40 x 26 x 34 mm.  Normal appearance/no adnexal mass. Pulsed Doppler evaluation of both ovaries demonstrates normal low-resistance arterial and venous waveforms. Other findings No abnormal free fluid. IMPRESSION: Normal pelvic ultrasound with IUD in expected position. Electronically Signed   By: Monte Fantasia M.D.   On: 12/08/2017 16:43   US Pelvis Complete  Result Date: 12/08/2017 CLINICAL DATA:  Sharp pelvic pain, resolved EXAM: TRANSABDOMINAL AND TRANSVAGINAL ULTRASOUND OF PELVIS DOPPLER ULTRASOUND OF OVARIES TECHNIQUE: Both transabdominal and transvaginal ultrasound examinations of the pelvis were performed. Transabdominal technique was performed for global imaging of the pelvis including uterus, ovaries, adnexal regions, and pelvic cul-de-sac. It was necessary to proceed with endovaginal exam following the transabdominal exam to visualize the endometrium. Color and duplex Doppler ultrasound was utilized to evaluate blood flow to the ovaries. COMPARISON:  06/13/2010 abdominal CT FINDINGS: Uterus Measurements: 7 x 4 x 4 cm.  No fibroids or other mass visualized. Endometrium Thickness: 4 mm where not obscured.  IUD in expected position Right ovary Measurements: 35 x 25 x 36 mm.  Normal appearance/no adnexal mass. Left ovary  Measurements: 40 x 26 x 34 mm.  Normal appearance/no adnexal mass. Pulsed Doppler evaluation of both ovaries demonstrates normal low-resistance arterial and venous waveforms. Other findings No abnormal free fluid. IMPRESSION: Normal pelvic ultrasound with IUD in expected position. Electronically Signed   By: Monte Fantasia M.D.   On: 12/08/2017 16:43   [place] Art/ven Flow Abd Pelv Doppler  Result Date: 12/08/2017 CLINICAL DATA:  Sharp pelvic pain, resolved EXAM: TRANSABDOMINAL AND TRANSVAGINAL ULTRASOUND OF PELVIS DOPPLER ULTRASOUND OF OVARIES TECHNIQUE: Both transabdominal and transvaginal ultrasound examinations of the pelvis were performed. Transabdominal technique was performed for global imaging of the pelvis including uterus, ovaries, adnexal regions, and pelvic cul-de-sac. It was necessary to proceed with endovaginal exam following the transabdominal exam to visualize the endometrium. Color and duplex Doppler ultrasound was utilized to evaluate blood flow to the ovaries. COMPARISON:  06/13/2010 abdominal CT FINDINGS: Uterus Measurements: 7 x 4 x 4 cm.  No fibroids or other mass visualized. Endometrium Thickness: 4 mm where not obscured.  IUD in expected position Right ovary Measurements: 35 x 25 x 36 mm.  Normal appearance/no adnexal mass. Left ovary Measurements: 40 x 26 x 34 mm.  Normal appearance/no adnexal mass. Pulsed Doppler evaluation of both ovaries demonstrates normal low-resistance arterial and venous waveforms. Other findings No abnormal free fluid. IMPRESSION: Normal pelvic ultrasound with IUD in expected position. Electronically Signed   By: Monte Fantasia M.D.   On: 12/08/2017 16:43    Procedures Procedures (including critical care time)  Medications Ordered in ED Medications - No data to display   Initial Impression / Assessment and Plan / ED Course  I have reviewed the triage vital signs and the nursing notes.  Pertinent labs & imaging results that were available during my care  of the patient were reviewed by me and considered in my medical decision making (see chart for details).  Clinical Course as of Dec 08 1698  Tue Dec 08, 2017  1323 Hgb urine dipstickMarland Kitchen): MODERATE [CG]  1323 RBC / HPF: 21-50 [CG]    Clinical Course User Index [CG] Kinnie Feil, PA-C   37 year old female is here for abdominal pain, lower, severe that lasted 10 minutes and resolved on its own.  Exam is very reassuring, she has no abdominal tenderness.  Pelvic exam performed negative for CMT, adnexal fullness or  tenderness, significant discharge.  She was in the ER for 5 hours and had no return of her symptoms.  Afebrile.  She was hungry and requested food.  Considering GYN etiology such as ruptured ovarian cyst versus ovarian torsion.  Less likely appendicitis given normal WBC, no associated nausea, vomiting, local right lower quadrant tenderness and good appetite.  She had similar brief episode of pain 3 days ago that also resolved on its own making appendicitis less likely. Will obtain UA, labs and ultrasound.  1555: Work-up remarkable for moderate hemoglobin and urine, patient states she has been having vaginal spotting lately which could explain this.  She has no history of kidney stones or CVA tenderness to suggest renal stone.  No urinary symptoms, doubt UTI/Pilo.  Labs otherwise unremarkable.  Pelvic ultrasound unremarkable.  Etiology of pain is unclear.  Discussed possibility of appendicitis with patient although low suspicion for this given clinical picture.  Discharged with good return precautions, she is aware of symptoms of appendicitis that would warrant return to ER.  Final Clinical Impressions(s) / ED Diagnoses   Final diagnoses:  Lower abdominal pain    ED Discharge Orders    None       Kinnie Feil, PA-C 12/08/17 1701    Sherwood Gambler, MD 12/08/17 2233

## 2017-12-08 NOTE — ED Notes (Signed)
Pt unable to sign due to signature pad not working.   Pt stable, ambulatory, and verbalizes understanding of d/c instructions.

## 2017-12-09 LAB — GC/CHLAMYDIA PROBE AMP (~~LOC~~) NOT AT ARMC
CHLAMYDIA, DNA PROBE: NEGATIVE
Neisseria Gonorrhea: NEGATIVE

## 2017-12-10 ENCOUNTER — Telehealth: Payer: Self-pay

## 2017-12-10 LAB — URINE CULTURE: Culture: NO GROWTH

## 2017-12-10 NOTE — Telephone Encounter (Signed)
Copied from [place] [id number]. Topic: Referral - Request >> Dec 10, 2017 11:11 AM Yvette Rack wrote: Reason for CRM: Pt requests referral for an abdominal ultrasound. Cb# [phone] >> Dec 10, 2017  2:03 PM Leonides Schanz, Jacinto Reap wrote: Pt called back asking if she can be contacted as soon as possible because she is afraid to go though the weekend without having the referral for the abdominal ultrasound. Pt request call back. Cb# [phone]

## 2017-12-11 ENCOUNTER — Encounter: Payer: Self-pay | Admitting: Family Medicine

## 2017-12-11 ENCOUNTER — Ambulatory Visit (INDEPENDENT_AMBULATORY_CARE_PROVIDER_SITE_OTHER): Payer: BLUE CROSS/BLUE SHIELD | Admitting: Family Medicine

## 2017-12-11 ENCOUNTER — Other Ambulatory Visit: Payer: Self-pay | Admitting: Surgery

## 2017-12-11 ENCOUNTER — Telehealth: Payer: Self-pay

## 2017-12-11 ENCOUNTER — Telehealth: Payer: Self-pay | Admitting: Emergency Medicine

## 2017-12-11 ENCOUNTER — Ambulatory Visit (INDEPENDENT_AMBULATORY_CARE_PROVIDER_SITE_OTHER): Payer: BLUE CROSS/BLUE SHIELD

## 2017-12-11 VITALS — BP 120/82 | HR 93 | Temp 98.3°F | Wt 196.6 lb

## 2017-12-11 DIAGNOSIS — R319 Hematuria, unspecified: Secondary | ICD-10-CM | POA: Diagnosis not present

## 2017-12-11 DIAGNOSIS — R1084 Generalized abdominal pain: Secondary | ICD-10-CM

## 2017-12-11 DIAGNOSIS — R1030 Lower abdominal pain, unspecified: Secondary | ICD-10-CM

## 2017-12-11 LAB — POC URINALSYSI DIPSTICK (AUTOMATED)
Bilirubin, UA: NEGATIVE
Blood, UA: POSITIVE
GLUCOSE UA: NEGATIVE
Ketones, UA: POSITIVE
Leukocytes, UA: NEGATIVE
Nitrite, UA: NEGATIVE
Protein, UA: NEGATIVE
Spec Grav, UA: 1.02 (ref 1.010–1.025)
UROBILINOGEN UA: 0.2 U/dL
pH, UA: 7 (ref 5.0–8.0)

## 2017-12-11 MED ORDER — PANTOPRAZOLE SODIUM 40 MG PO TBEC: 40.0000 mg | DELAYED_RELEASE_TABLET | Freq: Every day | ORAL | 3 refills | Status: DC

## 2017-12-11 NOTE — Telephone Encounter (Signed)
Copied from [place] [id number]. Topic: General - Other >> Dec 11, 2017  3:10 PM Yvette Rack wrote: Reason for CRM: Pt calling for x-ray results. Pt requests a call back today before office closes.

## 2017-12-11 NOTE — Patient Instructions (Signed)
We'll call you with xray results Go ahead and start protonix this may help if an ulcer or gastritis is contributing.  Avoid anti-inflammatories (ibuprofen, aleve, excedrin, goody powders, BC powders)

## 2017-12-11 NOTE — Assessment & Plan Note (Deleted)
[email or web address]

## 2017-12-11 NOTE — Telephone Encounter (Signed)
Patient has been scheduled to come in for an appointment 12/11/2017

## 2017-12-11 NOTE — Assessment & Plan Note (Signed)
Broad differential including renal calculus, GERD/Gastritis/PUD, gallbladder disease Had recent pelvic US that was unremarkable. She still has some mild hematuria on UA Will obtain KUB to eval for possible stone contributing Start protonix for potential GERD/Gastritis Consider US gallbladder and/or GI referral is symptoms persist.

## 2017-12-11 NOTE — Progress Notes (Signed)
[name] - 37 y.o. female MRN [id number]  Date of birth: 1981-01-22  Subjective Chief Complaint  Patient presents with  . Abdominal Pain    HPI [name] is a 37 y.o. female here today with complaint of abdominal pain.  Pain began about 1.5 weeks ago.   Initial episode stared in lower abdomen with radiation into periumbilical/epigastric area.  This went away after about a day and then returned a few days later.  When the second episode occurred she went to the ED.  In the ED she has labs and pelvic ultrasound completed.  [place] was unremarkable however she did have some blood in her urine.  She related this to a yeast infection from recent antibiotic use for strep throat.  She did not have dysuria or frequency.  STI testing was negative as well.  Since d/c from ED she has had another episode however this episode was more in the epigastric/RUQ area. This episode was associated with nausea.  She does not have any pain today.  She has not really noticed any association with foods.  She denies fever, chills, changes to bowel habits/blood in stool.  She does admit to using a lot of ibuprofen during her recent episode of strep pharyngitis.   ROS:  A comprehensive ROS was completed and negative except as noted per HPI   No Known Allergies  Past Medical History:  Diagnosis Date  . Allergy   . Arthritis   . CIN III (cervical intraepithelial neoplasia grade III) with severe dysplasia 11/2008   LEEP-D&C-HYSTEROSCOPY-SHOWED CIN II AND CIN III  . Depression   . Iron deficiency anemia secondary to blood loss (chronic) 07/29/2012  . Migraines     Past Surgical History:  Procedure Laterality Date  . ANTERIOR CRUCIATE LIGAMENT REPAIR  1998   LEFT  . CERVICAL BIOPSY  W/ LOOP ELECTRODE EXCISION  11/2008   CIN 3 free margins  . COLPOSCOPY    . INTRAUTERINE DEVICE INSERTION  03/09/2017   Mirena  . KNEE ARTHROCENTESIS  2006  . MOUTH SURGERY    . PILONIDAL CYST EXCISION  1999    Social  History   Socioeconomic History  . Marital status: Single    Spouse name: Not on file  . Number of children: Not on file  . Years of education: Not on file  . Highest education level: Not on file  Occupational History  . Not on file  Social Needs  . Financial resource strain: Not on file  . Food insecurity:    Worry: Not on file    Inability: Not on file  . Transportation needs:    Medical: Not on file    Non-medical: Not on file  Tobacco Use  . Smoking status: Current Every Day Smoker    Packs/day: 0.50    Types: Cigarettes  . Smokeless tobacco: Never Used  Substance and Sexual Activity  . Alcohol use: Not Currently    Alcohol/week: 0.0 standard drinks    Comment: Rare  . Drug use: No  . Sexual activity: Yes    Partners: Male    Birth control/protection: IUD    Comment: 1st intercourse 37 yo-Fewer than 5 partners Mirena 03/09/2017  Lifestyle  . Physical activity:    Days per week: Not on file    Minutes per session: Not on file  . Stress: Not on file  Relationships  . Social connections:    Talks on phone: Not on file    Gets together: Not on  file    Attends religious service: Not on file    Active member of club or organization: Not on file    Attends meetings of clubs or organizations: Not on file    Relationship status: Not on file  Other Topics Concern  . Not on file  Social History Narrative  . Not on file    Family History  Problem Relation Age of Onset  . Diabetes Father   . Hypertension Father   . Hyperlipidemia Father   . Lupus Mother   . Heart disease Paternal Grandfather   . Cancer Paternal Grandfather        Prostate    Health Maintenance  Topic Date Due  . PAP SMEAR  06/22/2016  . INFLUENZA VACCINE  11/12/2017  . TETANUS/TDAP  04/14/2026  . HIV Screening  Completed     ----------------------------------------------------------------------------------------------------------------------------------------------------------------------------------------------------------------- Physical Exam BP 120/82 (BP Location: Left Arm, Patient Position: Sitting, Cuff Size: Normal)   Pulse 93   Temp 98.3 F (36.8 C) (Oral)   Wt 196 lb 9.6 oz (89.2 kg)   BMI 33.75 kg/m   Physical Exam  Constitutional: She is oriented to person, place, and time. She appears well-nourished. No distress.  HENT:  Head: Normocephalic and atraumatic.  Mouth/Throat: Oropharynx is clear and moist.  Eyes: No scleral icterus.  Neck: Neck supple.  Cardiovascular: Normal rate, regular rhythm and normal heart sounds.  Pulmonary/Chest: Effort normal and breath sounds normal.  Abdominal: Soft. Normal appearance and bowel sounds are normal. She exhibits no distension. There is tenderness (mild ttp in RUQ, epigastric and flank areas. ). There is no rebound and no guarding.  Lymphadenopathy:    She has no cervical adenopathy.  Neurological: She is alert and oriented to person, place, and time. No cranial nerve deficit.  Skin: Skin is warm and dry. No rash noted.  Psychiatric: She has a normal mood and affect. Her behavior is normal. Thought content normal.    ------------------------------------------------------------------------------------------------------------------------------------------------------------------------------------------------------------------- Assessment and Plan  Generalized abdominal pain Broad differential including renal calculus, GERD/Gastritis/PUD, gallbladder disease Had recent pelvic US that was unremarkable. She still has some mild hematuria on UA Will obtain KUB to eval for possible stone contributing Start protonix for potential GERD/Gastritis Consider US gallbladder and/or GI referral is symptoms persist.

## 2017-12-11 NOTE — Telephone Encounter (Signed)
Copied from [place] [id number]. Topic: Referral - Request >> Dec 10, 2017 11:11 AM Yvette Rack wrote: Reason for CRM: Pt requests referral for an abdominal ultrasound. Cb# [phone] >> Dec 10, 2017  2:03 PM Leonides Schanz, Jacinto Reap wrote: Pt called back asking if she can be contacted as soon as possible because she is afraid to go though the weekend without having the referral for the abdominal ultrasound. Pt request call back. Cb# [phone] >> Dec 10, 2017  2:38 PM Carolyn Stare wrote:   The reason pt is requesting the abd [place] is because she is having extreme stomach pain and think it may be her gall bladder  Pelvic US was negative     Patient has been scheduled to come in for an appointment 12/11/2017.

## 2017-12-12 ENCOUNTER — Emergency Department (HOSPITAL_COMMUNITY): Payer: BLUE CROSS/BLUE SHIELD

## 2017-12-12 ENCOUNTER — Other Ambulatory Visit: Payer: Self-pay

## 2017-12-12 ENCOUNTER — Emergency Department (HOSPITAL_COMMUNITY)
Admission: EM | Admit: 2017-12-12 | Discharge: 2017-12-12 | Disposition: A | Discharge status: Home / Self Care | Payer: BLUE CROSS/BLUE SHIELD | Attending: Emergency Medicine | Admitting: Emergency Medicine

## 2017-12-12 DIAGNOSIS — R112 Nausea with vomiting, unspecified: Secondary | ICD-10-CM | POA: Diagnosis not present

## 2017-12-12 DIAGNOSIS — R102 Pelvic and perineal pain: Secondary | ICD-10-CM | POA: Insufficient documentation

## 2017-12-12 DIAGNOSIS — N132 Hydronephrosis with renal and ureteral calculous obstruction: Secondary | ICD-10-CM | POA: Diagnosis not present

## 2017-12-12 DIAGNOSIS — R1031 Right lower quadrant pain: Secondary | ICD-10-CM | POA: Diagnosis not present

## 2017-12-12 DIAGNOSIS — F17210 Nicotine dependence, cigarettes, uncomplicated: Secondary | ICD-10-CM | POA: Diagnosis not present

## 2017-12-12 DIAGNOSIS — N23 Unspecified renal colic: Secondary | ICD-10-CM | POA: Diagnosis not present

## 2017-12-12 DIAGNOSIS — N200 Calculus of kidney: Secondary | ICD-10-CM | POA: Diagnosis not present

## 2017-12-12 DIAGNOSIS — Z79899 Other long term (current) drug therapy: Secondary | ICD-10-CM | POA: Diagnosis not present

## 2017-12-12 LAB — URINALYSIS, ROUTINE W REFLEX MICROSCOPIC
Bacteria, UA: NONE SEEN
Bilirubin Urine: NEGATIVE
GLUCOSE, UA: NEGATIVE mg/dL
Ketones, ur: NEGATIVE mg/dL
Leukocytes, UA: NEGATIVE
Nitrite: NEGATIVE
PH: 6 (ref 5.0–8.0)
PROTEIN: NEGATIVE mg/dL
Specific Gravity, Urine: 1.019 (ref 1.005–1.030)

## 2017-12-12 LAB — LIPASE, BLOOD: Lipase: 28 U/L (ref 11–51)

## 2017-12-12 LAB — CBC
HCT: 38.2 % (ref 36.0–46.0)
Hemoglobin: 12.9 g/dL (ref 12.0–15.0)
MCH: 28.7 pg (ref 26.0–34.0)
MCHC: 33.8 g/dL (ref 30.0–36.0)
MCV: 85.1 fL (ref 78.0–100.0)
Platelets: 509 10*3/uL — ABNORMAL HIGH (ref 150–400)
RBC: 4.49 MIL/uL (ref 3.87–5.11)
RDW: 13.5 % (ref 11.5–15.5)
WBC: 14.3 10*3/uL — ABNORMAL HIGH (ref 4.0–10.5)

## 2017-12-12 LAB — COMPREHENSIVE METABOLIC PANEL
ALK PHOS: 61 U/L (ref 38–126)
ALT: 13 U/L (ref 0–44)
AST: 18 U/L (ref 15–41)
Albumin: 4.1 g/dL (ref 3.5–5.0)
Anion gap: 10 (ref 5–15)
BUN: 14 mg/dL (ref 6–20)
CALCIUM: 9.5 mg/dL (ref 8.9–10.3)
CO2: 24 mmol/L (ref 22–32)
CREATININE: 0.85 mg/dL (ref 0.44–1.00)
Chloride: 105 mmol/L (ref 98–111)
GFR calc Af Amer: >60 mL/min (ref >60)
GFR calc non Af Amer: >60 mL/min (ref >60)
Glucose, Bld: 171 mg/dL — ABNORMAL HIGH (ref 70–99)
Potassium: 4.2 mmol/L (ref 3.5–5.1)
SODIUM: 139 mmol/L (ref 135–145)
Total Bilirubin: 0.3 mg/dL (ref 0.3–1.2)
Total Protein: 7.4 g/dL (ref 6.5–8.1)

## 2017-12-12 LAB — I-STAT BETA HCG BLOOD, ED (MC, WL, AP ONLY): I-stat hCG, quantitative: <5 m[IU]/mL (ref <5)

## 2017-12-12 MED ORDER — IBUPROFEN 600 MG PO TABS: 600.0000 mg | ORAL_TABLET | Freq: Four times a day (QID) | ORAL | 0 refills | Status: DC | PRN

## 2017-12-12 MED ORDER — OXYCODONE-ACETAMINOPHEN 5-325 MG PO TABS: 1.0000 | ORAL_TABLET | ORAL | 0 refills | Status: DC | PRN

## 2017-12-12 MED ORDER — FENTANYL CITRATE (PF) 100 MCG/2ML IJ SOLN
50.0000 ug | INTRAMUSCULAR | Status: DC | PRN
  Administered 2017-12-12: 50 ug via INTRAVENOUS
  Filled 2017-12-12: qty 2

## 2017-12-12 MED ORDER — HYDROMORPHONE HCL 1 MG/ML IJ SOLN
1.0000 mg | Freq: Once | INTRAMUSCULAR | Status: AC
  Administered 2017-12-12: 1 mg via INTRAVENOUS
  Filled 2017-12-12: qty 1

## 2017-12-12 MED ORDER — ONDANSETRON HCL 4 MG/2ML IJ SOLN
4.0000 mg | Freq: Once | INTRAMUSCULAR | Status: AC
  Administered 2017-12-12: 4 mg via INTRAVENOUS
  Filled 2017-12-12: qty 2

## 2017-12-12 MED ORDER — TAMSULOSIN HCL 0.4 MG PO CAPS: 0.4000 mg | ORAL_CAPSULE | Freq: Every day | ORAL | 0 refills | Status: DC

## 2017-12-12 MED ORDER — KETOROLAC TROMETHAMINE 30 MG/ML IJ SOLN
30.0000 mg | Freq: Once | INTRAMUSCULAR | Status: AC
  Administered 2017-12-12: 30 mg via INTRAVENOUS
  Filled 2017-12-12: qty 1

## 2017-12-12 MED ORDER — OXYCODONE-ACETAMINOPHEN 5-325 MG PO TABS
1.0000 | ORAL_TABLET | Freq: Once | ORAL | Status: AC
  Administered 2017-12-12: 1 via ORAL
  Filled 2017-12-12: qty 1

## 2017-12-12 NOTE — ED Notes (Signed)
Patient diaphoretic, tearful, restless and dry heaving upon arrival into room. Patient complaining of 10/10 RLQ pain. Patient was recently worked up for same thinking pelvic origin but work up was negative.

## 2017-12-12 NOTE — ED Notes (Signed)
Pt stated being unable to provide a urine specimen yet.

## 2017-12-12 NOTE — Discharge Instructions (Signed)
Take ibuprofen for pain.  Percocet for severe pain.  Flomax to help you pass the stone.  Follow-up with urology on Monday or Tuesday if pain not improving.  Return if worsening pain and unable to control it at home or if you develop any fever

## 2017-12-12 NOTE — ED Triage Notes (Signed)
Pain in the right lower abdomen x 2 hours. No n/v/d, urinary or vaginal symptoms. Last took oxycodone 5mg  at the onset of pain around 0330.

## 2017-12-12 NOTE — ED Provider Notes (Signed)
[facility] DEPT Provider Note   CSN: [id number] Arrival date & time: 12/12/17  0533     History   Chief Complaint Chief Complaint  Patient presents with  . Abdominal Pain    HPI [name] is a 37 y.o. female.  HPI [name] is a 37 y.o. female presents emergency department complaining of right-sided abdominal pain.  Patient states the pain initially began 4 days ago.  She was seen in the emergency department at that time and had blood work, pelvic exam, pelvic ultrasound done which was all unremarkable.  It was noted that she had some blood in her urine.  While in the emergency department her symptoms completely resolved and she went home pain-free.  She states since then she has had one more episode that was similar to this but the pain improved on its own as well.  This morning, patient states pain began suddenly and has been there for about 2 hours.  Pain is the most severe pain she has ever had.  She reports associated dizziness, nausea, vomiting.  She denies any vaginal complaints.  She states she has seen some blood in her urine but denies any other urinary symptoms.  She took oxycodone that she was prescribed and it did not help her pain.  She denies similar pain in the past.  There is no pain in her back.  Denies fever or chills.  Past Medical History:  Diagnosis Date  . Allergy   . Arthritis   . CIN III (cervical intraepithelial neoplasia grade III) with severe dysplasia 11/2008   LEEP-D&C-HYSTEROSCOPY-SHOWED CIN II AND CIN III  . Depression   . Iron deficiency anemia secondary to blood loss (chronic) 07/29/2012  . Migraines     Patient Active Problem List   Diagnosis Date Noted  . Generalized abdominal pain 12/11/2017  . Labial cyst 09/04/2017  . Observed sleep apnea 09/04/2017  . Iron deficiency anemia secondary to blood loss (chronic) 07/29/2012  . Depression     Past Surgical History:  Procedure Laterality Date  .  ANTERIOR CRUCIATE LIGAMENT REPAIR  1998   LEFT  . CERVICAL BIOPSY  W/ LOOP ELECTRODE EXCISION  11/2008   CIN 3 free margins  . COLPOSCOPY    . INTRAUTERINE DEVICE INSERTION  03/09/2017   Mirena  . KNEE ARTHROCENTESIS  2006  . MOUTH SURGERY    . PILONIDAL CYST EXCISION  1999     OB History    Gravida  0   Para      Term      Preterm      AB      Living        SAB      TAB      Ectopic      Multiple      Live Births               Home Medications    Prior to Admission medications   Medication Sig Start Date End Date Taking? Authorizing Provider  amphetamine-dextroamphetamine (ADDERALL) 20 MG tablet Take 20 mg by mouth 3 (three) times daily.     [provider]  clonazePAM (KLONOPIN) 0.5 MG tablet Take 0.25 mg by mouth as needed for anxiety.     [provider]  levonorgestrel (MIRENA) 20 MCG/24HR IUD 1 each by Intrauterine route once.    [provider]  pantoprazole (PROTONIX) 40 MG tablet Take 1 tablet (40 mg total) by mouth daily. 12/11/17  Luetta Nutting, DO    Family History Family History  Problem Relation Age of Onset  . Diabetes Father   . Hypertension Father   . Hyperlipidemia Father   . Lupus Mother   . Heart disease Paternal Grandfather   . Cancer Paternal Grandfather        Prostate    Social History Social History   Tobacco Use  . Smoking status: Current Every Day Smoker    Packs/day: 0.50    Types: Cigarettes  . Smokeless tobacco: Never Used  Substance Use Topics  . Alcohol use: Not Currently    Alcohol/week: 0.0 standard drinks    Comment: Rare  . Drug use: No     Allergies   Patient has no known allergies.   Review of Systems Review of Systems  Constitutional: Negative for chills and fever.  Respiratory: Negative for cough, chest tightness and shortness of breath.   Cardiovascular: Negative for chest pain, palpitations and leg swelling.  Gastrointestinal: Positive for abdominal pain,  nausea and vomiting. Negative for diarrhea.  Genitourinary: Positive for pelvic pain. Negative for dysuria, flank pain, vaginal bleeding, vaginal discharge and vaginal pain.  Musculoskeletal: Negative for arthralgias, myalgias, neck pain and neck stiffness.  Skin: Negative for rash.  Neurological: Negative for dizziness, weakness and headaches.  All other systems reviewed and are negative.    Physical Exam Updated Vital Signs BP (!) 127/100   Pulse 95   Temp (!) 97.4 F (36.3 C) (Oral)   Resp (!) 22   Ht 5\' 4"  (1.626 m)   Wt 81.6 kg   SpO2 99%   BMI 30.90 kg/m    Physical Exam  Constitutional: She appears well-developed and well-nourished.  Appears to be in a lot of pain, she is actively vomiting, diaphoretic, unable to hold still in the stretcher.  HENT:  Head: Normocephalic.  Eyes: Conjunctivae are normal.  Neck: Neck supple.  Cardiovascular: Normal rate, regular rhythm and normal heart sounds.  Pulmonary/Chest: Effort normal and breath sounds normal. No respiratory distress. She has no wheezes. She has no rales.  Abdominal: Soft. Bowel sounds are normal. She exhibits no distension. There is no tenderness. There is no rebound.  Diffuse tenderness, worse in the right lower quadrant.  No CVA tenderness.  Musculoskeletal: She exhibits no edema.  Neurological: She is alert.  Skin: Skin is warm and dry.  Psychiatric: She has a normal mood and affect. Her behavior is normal.  Nursing note and vitals reviewed.    ED Treatments / Results  Labs (all labs ordered are listed, but only abnormal results are displayed) Labs Reviewed  COMPREHENSIVE METABOLIC PANEL - Abnormal; Notable for the following components:      Result Value   Glucose, Bld 171 (*)    All other components within normal limits  CBC - Abnormal; Notable for the following components:   WBC 14.3 (*)    Platelets 509 (*)    All other components within normal limits  URINALYSIS, ROUTINE W REFLEX MICROSCOPIC -  Abnormal; Notable for the following components:   Hgb urine dipstick MODERATE (*)    All other components within normal limits  LIPASE, BLOOD  I-STAT BETA HCG BLOOD, ED ([facility], [facility], [facility] ONLY)    EKG None  Radiology Dg Abd 1 View  Result Date: 12/11/2017 CLINICAL DATA:  1-1/2 week history of LOWER abdominal and pelvic pain associated with bloating and hematuria. Personal history of urinary tract calculi. EXAM: ABDOMEN - 1 VIEW COMPARISON:  Pelvic ultrasound 12/08/2017.  CT abdomen and pelvis 06/13/2010. FINDINGS: Bowel gas pattern unremarkable without evidence of obstruction or significant ileus. Moderate stool burden in the ascending and proximal transverse colon. Phleboliths low in the pelvis. No visible opaque urinary tract calculi, though the UPPER poles of the kidneys are excluded. Intrauterine device in the pelvic midline. Regional skeleton normal in appearance. IMPRESSION: No acute abdominal abnormality. Moderate stool burden in the ascending and proximal transverse colon. Electronically Signed   By: Evangeline Dakin M.D.   On: 12/11/2017 16:28    Procedures Procedures (including critical care time)  Medications Ordered in ED Medications  fentaNYL (SUBLIMAZE) injection 50 mcg (50 mcg Intravenous Given 12/12/17 0608)  HYDROmorphone (DILAUDID) injection 1 mg (has no administration in time range)  ketorolac (TORADOL) 30 MG/ML injection 30 mg (has no administration in time range)  ondansetron (ZOFRAN) injection 4 mg (4 mg Intravenous Given 12/12/17 0608)     Initial Impression / Assessment and Plan / ED Course  I have reviewed the triage vital signs and the nursing notes.  Pertinent labs & imaging results that were available during my care of the patient were reviewed by me and considered in my medical decision making (see chart for details).     Patient with recurrent pain in the right abdomen.  There is no flank pain.  Had a work-up 4 days ago which was negative as far as blood test  work, urinalysis with hematuria, had pelvic exam and ultrasound which was unremarkable.  Today, patient presents in acute pain, she is moaning, she is rolling around the bed, she is diaphoretic and vomiting.  Her pain is most consistent with a renal colic.  I will get CT renal for further evaluation.  Unlikely appendicitis since pain would come and go and she would have several days of pain free.  I will give her some more medications, she is so far received 50 mcg of fentanyl and Zofran by RN protocol.  I will give her Toradol and Dilaudid.  8:50 AM  CT scan showsMild right hydronephrosis with a punctate stone in the right UVC.  Urinalysis with no signs of infection.  Patient feels much better, pain resolved at this time.  Will discharge home with pain medications, Flomax, urology follow-up.  Patient is tolerating oral liquids.  Discussed discharge plan and follow-up instructions.  She agreed.  Vitals:   12/12/17 0541  BP: (!) 127/100  Pulse: 95  Resp: (!) 22  Temp: (!) 97.4 F (36.3 C)  TempSrc: Oral  SpO2: 99%  Weight: 81.6 kg  Height: 5\' 4"  (1.626 m)     Final Clinical Impressions(s) / ED Diagnoses   Final diagnoses:  Renal colic  Kidney stone    ED Discharge Orders         Ordered    oxyCODONE-acetaminophen (PERCOCET) 5-325 MG tablet  Every 4 hours PRN     12/12/17 0852    ibuprofen (ADVIL,MOTRIN) 600 MG tablet  Every 6 hours PRN     12/12/17 0852    tamsulosin (FLOMAX) 0.4 MG CAPS capsule  Daily     12/12/17 0852          Jeannett Senior, PA-C 12/12/17 1508  Lacretia Leigh, MD 12/13/17 [phone]

## 2017-12-12 NOTE — ED Notes (Signed)
Pt ambulated to bathroom without difficulty, urine provided for testing

## 2017-12-16 ENCOUNTER — Telehealth: Payer: Self-pay

## 2017-12-16 DIAGNOSIS — F17210 Nicotine dependence, cigarettes, uncomplicated: Secondary | ICD-10-CM | POA: Diagnosis not present

## 2017-12-16 DIAGNOSIS — N200 Calculus of kidney: Secondary | ICD-10-CM | POA: Diagnosis not present

## 2017-12-16 DIAGNOSIS — R319 Hematuria, unspecified: Secondary | ICD-10-CM | POA: Diagnosis not present

## 2017-12-16 DIAGNOSIS — Z87442 Personal history of urinary calculi: Secondary | ICD-10-CM | POA: Diagnosis not present

## 2017-12-16 NOTE — Progress Notes (Signed)
Patient referred by Dr. Zigmund Daniel, seen by me on 11/09/17, HST on 12/08/17 (repeat).    Please call and notify the patient that the recent home sleep test showed obstructive sleep apnea in the moderate range. While I recommend treatment for this in the form CPAP, her insurance will not approve a sleep study for this. They will likely only approve a trial of autoPAP, which means, that we don't have to bring her in for a sleep study with CPAP, but will let her try an autoPAP machine at home, through a DME company (of her choice, or as per insurance requirement). The DME representative will educate her on how to use the machine, how to put the mask on, etc. I have placed an order in the chart. Please send referral, talk to patient, send report to referring MD. We will need a FU in sleep clinic for 10 weeks post-PAP set up, please arrange that with me or one of our NPs. Thanks,   Star Age, MD, PhD [facility])

## 2017-12-16 NOTE — Telephone Encounter (Signed)
error 

## 2017-12-16 NOTE — Addendum Note (Signed)
Addended by: Star Age on: 12/16/2017 07:43 AM   Modules accepted: Orders

## 2017-12-16 NOTE — Procedures (Signed)
[facility] [address] [place], [place] [address] NAME:  [name]                                                                DOB: [birth year] MEDICAL RECORD NUMBER  [id number]                                                    DOS:  12/08/17 REFERRING PHYSICIAN: Luetta Nutting, DO STUDY PERFORMED: Home Sleep Test [facility], repeat study from 11/30/17) HISTORY: 37 year old woman with a history of anemia, depression, ADHD, arthritis, allergies and obesity, who reports snoring and excessive daytime somnolence. Her Epworth sleepiness score is 17 out of 24, BMI:34.6.   STUDY RESULTS:  Total Recording Time: 7 hours, 8 minutes (valid test time: 6 hours, 28 min) Total Apnea/Hypopnea Index (AHI):   18.1/h, RDI: 18.3/h Average Oxygen Saturation: 95%, Lowest Oxygen Desaturation: 84%  Total Time Oxygen Saturation Below or at 88 %: 0.7 minutes  Average Heart Rate:  90 bpm (between 66 and 118 bpm) IMPRESSION: OSA RECOMMENDATION: This home sleep test demonstrates moderate obstructive sleep apnea (by number of events), with a total AHI of 18.1/hour and O2 nadir of 84%. Given the patient's medical history and sleep related complaints, treatment with positive airway pressure (in the form of CPAP) is recommended. This will require a full night CPAP titration study for proper treatment settings, O2 monitoring and mask fitting. Based on the severity of the sleep disordered breathing, an attended titration study is indicated. However, patient's insurance has denied an attended sleep study; therefore, the patient will be advised to proceed with an autoPAP titration/trial at home for now. Please note that untreated obstructive sleep apnea may carry additional perioperative morbidity. Patients with significant obstructive sleep apnea should receive perioperative PAP therapy and the surgeons and particularly the anesthesiologist should be informed of the diagnosis and the severity of  the sleep disordered breathing. The patient should be cautioned not to drive, work at heights, or operate dangerous or heavy equipment when tired or sleepy. Review and reiteration of good sleep hygiene measures should be pursued with any patient. Other causes of the patient's symptoms, including circadian rhythm disturbances, an underlying mood disorder, medication effect and/or an underlying medical problem cannot be ruled out based on this test. Clinical correlation is recommended. The patient and his referring provider will be notified of the test results. The patient will be seen in follow up in sleep clinic at [facility]. I certify that I have reviewed the raw data recording prior to the issuance of this report in accordance with the standards of the American Academy of Sleep Medicine (AASM).  Star Age, MD, PhD [facility]) Diplomat, ABPN (Neurology and Sleep)

## 2017-12-16 NOTE — Telephone Encounter (Signed)
-----   Message from Rachael Age, MD sent at 12/16/2017  7:43 AM EDT ----- Patient referred by Dr. Zigmund Daniel, seen by me on 11/09/17, HST on 12/08/17 (repeat).    Please call and notify the patient that the recent home sleep test showed obstructive sleep apnea in the moderate range. While I recommend treatment for this in the form CPAP, her insurance will not approve a sleep study for this. They will likely only approve a trial of autoPAP, which means, that we don't have to bring her in for a sleep study with CPAP, but will let her try an autoPAP machine at home, through a DME company (of her choice, or as per insurance requirement). The DME representative will educate her on how to use the machine, how to put the mask on, etc. I have placed an order in the chart. Please send referral, talk to patient, send report to referring MD. We will need a FU in sleep clinic for 10 weeks post-PAP set up, please arrange that with me or one of our NPs. Thanks,   Rachael Age, MD, PhD [facility])

## 2017-12-16 NOTE — Telephone Encounter (Signed)
I called pt to discuss her sleep study results, no answer, left a message asking her to call me back. 

## 2017-12-16 NOTE — Telephone Encounter (Signed)
Patient was sent results via my chart from PCP. Nothing further needed.

## 2017-12-16 NOTE — Telephone Encounter (Signed)
I called pt and had an extended conversation with her. I advised pt that Dr. Rexene Alberts reviewed their sleep study results and found that pt has moderate osa. Dr. Rexene Alberts recommends that pt start an auto pap at home since pt's insurance will deny a cpap titration study. I reviewed PAP compliance expectations with the pt. Pt is agreeable to starting an auto-PAP. I advised pt that an order will be sent to a DME, [facility], and [facility] will call the pt within about one week after they file with the pt's insurance. [facility] will show the pt how to use the machine, fit for masks, and troubleshoot the auto-PAP if needed. A follow up appt was made for insurance purposes with Dr. Rexene Alberts on 03/17/18 at 11:30am. Pt verbalized understanding to arrive 15 minutes early and bring their auto-PAP. A letter with all of this information in it will be sent to pt's mychart account as a reminder. I verified with the pt that the address we have on file is correct. Pt verbalized understanding of results.   Pt is asking if it is worth the trouble to submit a cpap titration to [employer]. I explained that our sleep lab generally knows which insurances will and will not approve in lab studies. Pt is asking for our sleep lab to call her to discuss this process. I will ask our sleep lab to call her. Pt will proceed with auto pap unless our sleep lab informs [place] that they will submit for a titration study.

## 2017-12-16 NOTE — Telephone Encounter (Signed)
Called patient to discuss insurance approval of in lab studies.  Unfortunately, pt's insurance will not approve an in lab CPAP study at this time.  Due to having a HST first, pt has to have a autoCPAP at home first. Then, if patient is having difficulties with mask, PAP or symptoms, we can get approval for in lab CPAP study at that time.

## 2017-12-17 DIAGNOSIS — Z87442 Personal history of urinary calculi: Secondary | ICD-10-CM | POA: Insufficient documentation

## 2018-02-02 DIAGNOSIS — G4733 Obstructive sleep apnea (adult) (pediatric): Secondary | ICD-10-CM | POA: Diagnosis not present

## 2018-02-04 ENCOUNTER — Encounter: Payer: Self-pay | Admitting: Family Medicine

## 2018-02-04 ENCOUNTER — Ambulatory Visit (INDEPENDENT_AMBULATORY_CARE_PROVIDER_SITE_OTHER): Payer: BLUE CROSS/BLUE SHIELD | Admitting: Family Medicine

## 2018-02-04 VITALS — BP 102/78 | HR 98 | Temp 98.3°F | Wt 194.8 lb

## 2018-02-04 DIAGNOSIS — D500 Iron deficiency anemia secondary to blood loss (chronic): Secondary | ICD-10-CM | POA: Diagnosis not present

## 2018-02-04 DIAGNOSIS — D509 Iron deficiency anemia, unspecified: Secondary | ICD-10-CM | POA: Diagnosis not present

## 2018-02-04 DIAGNOSIS — L0293 Carbuncle, unspecified: Secondary | ICD-10-CM

## 2018-02-04 MED ORDER — CLINDAMYCIN PHOSPHATE 1 % EX GEL: Freq: Two times a day (BID) | CUTANEOUS | 0 refills | Status: DC

## 2018-02-04 NOTE — Progress Notes (Signed)
[name] - 37 y.o. female MRN [id number]  Date of birth: 1980-05-12  Subjective Chief Complaint  Patient presents with  . Follow-up    HPI [name] is a 37 y.o. female here today for follow up of iron deficiency anemia.  She has not been taking iron supplement consistently.  Reports that fatigue is about the same, however recently started using CPAP for OSA.  She is hopeful to see some improvement of her fatigue with this.    She also reports recurrent bumps to inner thigh/groin area.  Has som occasionally in underarm areas as well.  Has required I&D of these in the past.  Unable to tolerate doxycycline due to nausea.   ROS:  A comprehensive ROS was completed and negative except as noted per HPI  No Known Allergies  Past Medical History:  Diagnosis Date  . Allergy   . Arthritis   . CIN III (cervical intraepithelial neoplasia grade III) with severe dysplasia 11/2008   LEEP-D&C-HYSTEROSCOPY-SHOWED CIN II AND CIN III  . Depression   . Iron deficiency anemia secondary to blood loss (chronic) 07/29/2012  . Migraines     Past Surgical History:  Procedure Laterality Date  . ANTERIOR CRUCIATE LIGAMENT REPAIR  1998   LEFT  . CERVICAL BIOPSY  W/ LOOP ELECTRODE EXCISION  11/2008   CIN 3 free margins  . COLPOSCOPY    . INTRAUTERINE DEVICE INSERTION  03/09/2017   Mirena  . KNEE ARTHROCENTESIS  2006  . MOUTH SURGERY    . PILONIDAL CYST EXCISION  1999    Social History   Socioeconomic History  . Marital status: Single    Spouse name: Not on file  . Number of children: Not on file  . Years of education: Not on file  . Highest education level: Not on file  Occupational History  . Not on file  Social Needs  . Financial resource strain: Not on file  . Food insecurity:    Worry: Not on file    Inability: Not on file  . Transportation needs:    Medical: Not on file    Non-medical: Not on file  Tobacco Use  . Smoking status: Current Every Day Smoker    Packs/day:  0.50    Types: Cigarettes  . Smokeless tobacco: Never Used  Substance and Sexual Activity  . Alcohol use: Not Currently    Alcohol/week: 0.0 standard drinks    Comment: Rare  . Drug use: No  . Sexual activity: Yes    Partners: Male    Birth control/protection: IUD    Comment: 1st intercourse 37 yo-Fewer than 5 partners Mirena 03/09/2017  Lifestyle  . Physical activity:    Days per week: Not on file    Minutes per session: Not on file  . Stress: Not on file  Relationships  . Social connections:    Talks on phone: Not on file    Gets together: Not on file    Attends religious service: Not on file    Active member of club or organization: Not on file    Attends meetings of clubs or organizations: Not on file    Relationship status: Not on file  Other Topics Concern  . Not on file  Social History Narrative  . Not on file    Family History  Problem Relation Age of Onset  . Diabetes Father   . Hypertension Father   . Hyperlipidemia Father   . Lupus Mother   . Heart disease Paternal  Grandfather   . Cancer Paternal Grandfather        Prostate    Health Maintenance  Topic Date Due  . PAP SMEAR  06/22/2016  . TETANUS/TDAP  04/14/2026  . INFLUENZA VACCINE  Completed  . HIV Screening  Completed    ----------------------------------------------------------------------------------------------------------------------------------------------------------------------------------------------------------------- Physical Exam BP 102/78   Pulse 98   Temp 98.3 F (36.8 C)   Wt 194 lb 12.8 oz (88.4 kg)   SpO2 99%   BMI 33.44 kg/m   Physical Exam  Constitutional: She appears well-nourished. No distress.  HENT:  Head: Normocephalic and atraumatic.  Eyes: No scleral icterus.  Neck: Neck supple. No thyromegaly present.  Cardiovascular: Normal rate, regular rhythm and normal heart sounds.  Pulmonary/Chest: Effort normal and breath sounds normal.  Lymphadenopathy:    She has  no cervical adenopathy.  Skin: Skin is warm and dry.  Psychiatric: She has a normal mood and affect. Her behavior is normal.    ------------------------------------------------------------------------------------------------------------------------------------------------------------------------------------------------------------------- Assessment and Plan  Recurrent boils -?Hidradenitis -Unable to tolerate doxycycline. -Trial of topical clindamycin   Iron deficiency anemia secondary to blood loss (chronic) -Update CBC and ferritin.

## 2018-02-04 NOTE — Assessment & Plan Note (Signed)
-?  Hidradenitis -Unable to tolerate doxycycline. -Trial of topical clindamycin

## 2018-02-04 NOTE — Patient Instructions (Signed)
Try clindamycin gel to areas on legs.

## 2018-02-04 NOTE — Assessment & Plan Note (Signed)
-  Update CBC and ferritin.

## 2018-02-05 NOTE — Addendum Note (Signed)
Addended by: Lynnea Ferrier on: 02/05/2018 02:15 PM   Modules accepted: Orders

## 2018-03-04 ENCOUNTER — Other Ambulatory Visit: Payer: BLUE CROSS/BLUE SHIELD

## 2018-03-04 DIAGNOSIS — D509 Iron deficiency anemia, unspecified: Secondary | ICD-10-CM | POA: Diagnosis not present

## 2018-03-04 LAB — CBC
HEMATOCRIT: 37.6 % (ref 35.0–45.0)
Hemoglobin: 12.7 g/dL (ref 11.7–15.5)
MCH: 28.5 pg (ref 27.0–33.0)
MCHC: 33.8 g/dL (ref 32.0–36.0)
MCV: 84.3 fL (ref 80.0–100.0)
MPV: 9.2 fL (ref 7.5–12.5)
PLATELETS: 461 10*3/uL — AB (ref 140–400)
RBC: 4.46 10*6/uL (ref 3.80–5.10)
RDW: 12.9 % (ref 11.0–15.0)
WBC: 9.3 10*3/uL (ref 3.8–10.8)

## 2018-03-04 LAB — FERRITIN: Ferritin: 61 ng/mL (ref 16–154)

## 2018-03-05 DIAGNOSIS — N200 Calculus of kidney: Secondary | ICD-10-CM | POA: Diagnosis not present

## 2018-03-05 DIAGNOSIS — G4733 Obstructive sleep apnea (adult) (pediatric): Secondary | ICD-10-CM | POA: Diagnosis not present

## 2018-03-17 ENCOUNTER — Encounter: Payer: Self-pay | Admitting: Neurology

## 2018-03-17 ENCOUNTER — Ambulatory Visit (INDEPENDENT_AMBULATORY_CARE_PROVIDER_SITE_OTHER): Payer: BLUE CROSS/BLUE SHIELD | Admitting: Neurology

## 2018-03-17 VITALS — BP 134/89 | HR 103 | Ht 64.0 in | Wt 195.0 lb

## 2018-03-17 DIAGNOSIS — G4733 Obstructive sleep apnea (adult) (pediatric): Secondary | ICD-10-CM

## 2018-03-17 DIAGNOSIS — Z9989 Dependence on other enabling machines and devices: Secondary | ICD-10-CM | POA: Diagnosis not present

## 2018-03-17 NOTE — Patient Instructions (Addendum)
Please call in or around 12/28 for another download - we can do this remotely.   You have benefited from treatment, thankfully. Please be fully compliant, as your insurance may look at compliance too.  Please continue using your autoPAP regularly. While your insurance requires that you use PAP at least 4 hours each night on 70% of the nights, I recommend, that you not skip any nights and use it throughout the night if you can. Getting used to PAP and staying with the treatment long term does take time and patience and discipline. Untreated obstructive sleep apnea when it is moderate to severe can have an adverse impact on cardiovascular health and raise her risk for heart disease, arrhythmias, hypertension, congestive heart failure, stroke and diabetes. Untreated obstructive sleep apnea causes sleep disruption, nonrestorative sleep, and sleep deprivation. This can have an impact on your day to day functioning and cause daytime sleepiness and impairment of cognitive function, memory loss, mood disturbance, and problems focussing. Using PAP regularly can improve these symptoms.

## 2018-03-17 NOTE — Progress Notes (Signed)
Subjective:    [name] ID: Rachael [name] is a 37 y.o. female.  HPI     Interim history:   Rachael [name] is a 37 year old right-handed woman with an underlying medical history of anemia, depression, ADHD, arthritis, allergies and obesity, who presents for follow-up consultation of her obstructive sleep apnea, after home sleep testing and starting AutoPap therapy. Rachael [name] is unaccompanied today. I first met her on 11/09/2017 at Rachael request of her primary care physician, at which time she reported snoring and daytime somnolence, Epworth sleepiness score was 17 at Rachael time. She also reported a family history of OSA. She was advised to proceed with sleep study testing. Her insurance denied her laboratory attended sleep study. She had a home sleep test on 12/09/2017 which indicated moderate obstructive sleep apnea with an AHI of 18.1 per hour, average oxygen saturation of 95%, nadir of 84%. She was advised to proceed with AutoPap therapy at home.   Today, 03/16/18: I reviewed her AutoPap compliance data from 02/13/2018 through 03/14/2018 which is a total of 30 days, during which time she used her AutoPap only 7 days with percent used days greater than 4 hours at 23%, indicating poor compliance, average usage for days on treatment of 6 hours and 18 minutes, residual AHI 0.9 per hour, leak on Rachael low side with Rachael 95th percentile at 3.1 L/m, 95th percentile of pressure at 10.3 cm with a range of 6 cm to 12 cm with EPR. In Rachael past 2 weeks her compliance has been similar. She used her machine about 15 out of 41 days. She reports still struggling with Rachael AutoPap mask for Rachael most part. She does not mind Rachael pressure. She was diagnosed with a kidney stone recently, presented to [facility] emergency room on 12/12/2017 with severe abdominal pain, CT scan showed mild right hydronephrosis with a punctate stone at Rachael R ureterovesical junction and a nonobstructing stone in Rachael left kidney. She has a Resmed nasal  cushion, could not tolerate Rachael pillows as well. She actually feels better when she is able to use it. She is motivated to use autoPAP.   Rachael [name]'s allergies, current medications, family history, past medical history, past social history, past surgical history and problem list were reviewed and updated as appropriate.   Previously:   11/09/17: (She) reports snoring and excessive daytime somnolence. I reviewed your office note from 09/04/2017. Her Epworth sleepiness score is 17 out of 24, fatigue score is 46 out of 63. Her father has sleep apnea and had sleep apnea surgery but also uses a CPAP machine. She has never had a tonsillectomy, she recently had an overnight stay in [facility] hospital after a cyst removal, at which time she was noted to have desaturations during sleep requiring overnight oxygen. Her snoring has become worse. Her sleepiness has become worse. However, she reports that she was sleepy when she was a teenager or while in college. She has gained weight with time. She has been witnessed to have pauses in her breathing by previous boyfriends. She is single, lives alone, no children. She has 1 dog, one cat. 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 At time is around midnight or 1 AM, rise time around 7 or 7:30 AM. She does not have night to night nocturia but has woken up with a headache occasionally, and has a history of intermittent migraine headaches, managed primarily with over-Rachael-counter medications as needed. She has been on clonazepam as needed, reports taking it infrequently  but in Rachael past was taking it daily for sleep. She has been on Adderall 3 times a day for her ADD. She has gained weight in Rachael past 2 months or so. She has a remote history of difficulty with TMJ bilaterally. She has a history of restless sleep and twitching in her sleep. She admits that she does not take her Lexapro on a regular basis and has noted less twitching since she has been off Rachael Lexapro.    Her Past Medical History Is Significant For: Past Medical History:  Diagnosis Date  . Allergy   . Arthritis   . CIN III (cervical intraepithelial neoplasia grade III) with severe dysplasia 11/2008   LEEP-D&C-HYSTEROSCOPY-SHOWED CIN II AND CIN III  . Depression   . Iron deficiency anemia secondary to blood loss (chronic) 07/29/2012  . Migraines     Her Past Surgical History Is Significant For: Past Surgical History:  Procedure Laterality Date  . ANTERIOR CRUCIATE LIGAMENT REPAIR  1998   LEFT  . CERVICAL BIOPSY  W/ LOOP ELECTRODE EXCISION  11/2008   CIN 3 free margins  . COLPOSCOPY    . INTRAUTERINE DEVICE INSERTION  03/09/2017   Mirena  . KNEE ARTHROCENTESIS  2006  . MOUTH SURGERY    . PILONIDAL CYST EXCISION  1999    Her Family History Is Significant For: Family History  Problem Relation Age of Onset  . Diabetes Father   . Hypertension Father   . Hyperlipidemia Father   . Lupus Mother   . Heart disease Paternal Grandfather   . Cancer Paternal Grandfather        Prostate    Her Social History Is Significant For: Social History   Socioeconomic History  . Marital status: Single    Spouse name: Not on file  . Number of children: Not on file  . Years of education: Not on file  . Highest education level: Not on file  Occupational History  . Not on file  Social Needs  . Financial resource strain: Not on file  . Food insecurity:    Worry: Not on file    Inability: Not on file  . Transportation needs:    Medical: Not on file    Non-medical: Not on file  Tobacco Use  . Smoking status: Current Every Day Smoker    Packs/day: 0.50    Types: Cigarettes  . Smokeless tobacco: Never Used  Substance and Sexual Activity  . Alcohol use: Not Currently    Alcohol/week: 0.0 standard drinks    Comment: Rare  . Drug use: No  . Sexual activity: Yes    Partners: Male    Birth control/protection: IUD    Comment: 1st intercourse 37 yo-Fewer than 5 partners Mirena  03/09/2017  Lifestyle  . Physical activity:    Days per week: Not on file    Minutes per session: Not on file  . Stress: Not on file  Relationships  . Social connections:    Talks on phone: Not on file    Gets together: Not on file    Attends religious service: Not on file    Active member of club or organization: Not on file    Attends meetings of clubs or organizations: Not on file    Relationship status: Not on file  Other Topics Concern  . Not on file  Social History Narrative  . Not on file    Her Allergies Are:  No Known Allergies:   Her Current Medications Are:  Outpatient Encounter Medications as of 03/17/2018  Medication Sig  . amphetamine-dextroamphetamine (ADDERALL) 20 MG tablet Take 20 mg by mouth 3 (three) times daily.   . clindamycin (CLINDAGEL) 1 % gel Apply topically 2 (two) times daily.  . clonazePAM (KLONOPIN) 0.5 MG tablet Take 0.25 mg by mouth as needed for anxiety.   Marland Kitchen escitalopram (LEXAPRO) 10 MG tablet Take by mouth.  . levonorgestrel (MIRENA) 20 MCG/24HR IUD 1 each by Intrauterine route once.   No facility-administered encounter medications on file as of 03/17/2018.   :  Review of Systems:  Out of a complete 14 point review of systems, all are reviewed and negative with Rachael exception of these symptoms as listed below:  Review of Systems  Neurological:       Pt presents today to discuss her auto pap. Pt sleeps better with Rachael auto pap but is having a hard time adjusting to Rachael head gear.  Epworth Sleepiness Scale 0= would never doze 1= slight chance of dozing 2= moderate chance of dozing 3= high chance of dozing  Sitting and reading: 1 Watching TV: 1 Sitting inactive in a public place (ex. Theater or meeting): 0 As a passenger in a car for an hour without a break: 0 Lying down to rest in Rachael afternoon: 1 Sitting and talking to someone: 0 Sitting quietly after lunch (no alcohol): 1 In a car, while stopped in traffic: 0 Total: 4      Objective:  Neurological Exam  Physical Exam Physical Examination:   Vitals:   03/17/18 1142  BP: 134/89  Pulse: (!) 103    General Examination: Rachael [name] is a very pleasant 37 y.o. female in no acute distress. She appears well-developed and well-nourished and well groomed.   HEENT: Normocephalic, atraumatic, pupils are equal, round and reactive to light and accommodation. Extraocular tracking is good without limitation to gaze excursion or nystagmus noted. Normal smooth pursuit is noted. Hearing is grossly intact. Face is symmetric with normal facial animation and normal facial sensation. Speech is clear with no dysarthria noted. There is no hypophonia. There is no lip, neck/head, jaw or voice tremor. Neck shows FROM. Oropharynx exam reveals: mild mouth dryness, good dental hygiene and mild airway crowding, due to smaller airway entry. Mallampati is class I. Tongue protrudes centrally and palate elevates symmetrically.   Chest: Clear to auscultation without wheezing, rhonchi or crackles noted.  Heart: S1+S2+0, regular and normal without murmurs, rubs or gallops noted.   Abdomen: Soft, non-tender and non-distended with normal bowel sounds appreciated on auscultation.  Extremities: There is no pitting edema in Rachael distal lower extremities bilaterally.  Skin: Warm and dry without trophic changes noted.  Musculoskeletal: exam reveals no obvious joint deformities, tenderness or joint swelling or erythema.   Neurologically:  Mental status: Rachael [name] is awake, alert and oriented in all 4 spheres. Her immediate and remote memory, attention, language skills and fund of knowledge are appropriate. There is no evidence of aphasia, agnosia, apraxia or anomia. Speech is clear with normal prosody and enunciation. Thought process is linear. Mood is normal and affect is normal.  Cranial nerves II - XII are as described above under HEENT exam.  Motor exam: Normal bulk, strength and  tone is noted. There is no tremor or rebound. Fine motor skills and coordination: grossly intact.  Cerebellar testing: No dysmetria or intention tremor. There is no truncal or gait ataxia.  Sensory exam: intact to light touch in Rachael upper and lower extremities.  Gait, station  and balance: She stands easily. No veering to one side is noted. No leaning to one side is noted. Posture is age-appropriate and stance is narrow based. Gait shows normal stride length and normal pace. No problems turning are noted.   Assessment and Plan:  In summary, Rachael [name] is a very pleasant 37 year old female with an underlying medical history of anemia, depression, ADHD, arthritis, allergies and obesity, who presents for follow-up consultation of her obstructive sleep apnea which was determined to be in Rachael moderate range by home sleep testing on 12/08/2017. She has started AutoPap therapy in October 2019 but has not yet been fully compliant with treatment, still adjusting primarily to Rachael interface and headgear. She is motivated to continue and is advised to be fully compliant with treatment as she may also have insurance requirements for compliance to fulfill. She is aware of this. She indicates benefit from treating her sleep apnea with AutoPap and has noticed better sleep consolidation and longer sleep at night when she was able to use her AutoPap. We talked about her home test results in detail today as well as her compliance data. I suggested we review her compliance data in a month from now and she is advised to call or email [place] through her electronic chart portal. I suggested a follow-up routinely in 6 months, she can see one of our nurse practitioners at Rachael time. I answered all her questions today and she was in agreement.  I spent 25 minutes in total face-to-face time with Rachael [name], more than 50% of which was spent in counseling and coordination of care, reviewing test results, reviewing medication and  discussing or reviewing Rachael diagnosis of OSA, its prognosis and treatment options. Pertinent laboratory and imaging test results that were available during this visit with Rachael [name] were reviewed by me and considered in my medical decision making (see chart for details).

## 2018-03-18 DIAGNOSIS — L0889 Other specified local infections of the skin and subcutaneous tissue: Secondary | ICD-10-CM | POA: Diagnosis not present

## 2018-03-18 DIAGNOSIS — L738 Other specified follicular disorders: Secondary | ICD-10-CM | POA: Diagnosis not present

## 2018-03-22 ENCOUNTER — Encounter: Payer: Self-pay | Admitting: Family Medicine

## 2018-03-22 ENCOUNTER — Ambulatory Visit (INDEPENDENT_AMBULATORY_CARE_PROVIDER_SITE_OTHER): Payer: BLUE CROSS/BLUE SHIELD | Admitting: Family Medicine

## 2018-03-22 DIAGNOSIS — J069 Acute upper respiratory infection, unspecified: Secondary | ICD-10-CM | POA: Diagnosis not present

## 2018-03-22 NOTE — Progress Notes (Signed)
[name] - 37 y.o. female MRN [id number]  Date of birth: Dec 04, 1980  Subjective Chief Complaint  Patient presents with  . URI    Symptoms began two days ago-chills, body aches and sinus drainageg. She had one 500 mg amoxicilin she took last nigjht.     HPI  [name] is a 37 y.o. female who complains of sore throat, swollen glands, post nasal drip, dry cough, myalgias and chills for 3 days. She denies a history of chest pain, fatigue, fevers, nausea, shortness of breath, vomiting, wheezing and sputum production and denies a history of asthma. Patient does smoke cigarettes.  She has not tried anything so far for treatment.   ROS:  A comprehensive ROS was completed and negative except as noted per HPI    No Known Allergies  Past Medical History:  Diagnosis Date  . Allergy   . Arthritis   . CIN III (cervical intraepithelial neoplasia grade III) with severe dysplasia 11/2008   LEEP-D&C-HYSTEROSCOPY-SHOWED CIN II AND CIN III  . Depression   . Iron deficiency anemia secondary to blood loss (chronic) 07/29/2012  . Migraines     Past Surgical History:  Procedure Laterality Date  . ANTERIOR CRUCIATE LIGAMENT REPAIR  1998   LEFT  . CERVICAL BIOPSY  W/ LOOP ELECTRODE EXCISION  11/2008   CIN 3 free margins  . COLPOSCOPY    . INTRAUTERINE DEVICE INSERTION  03/09/2017   Mirena  . KNEE ARTHROCENTESIS  2006  . MOUTH SURGERY    . PILONIDAL CYST EXCISION  1999    Social History   Socioeconomic History  . Marital status: Single    Spouse name: Not on file  . Number of children: Not on file  . Years of education: Not on file  . Highest education level: Not on file  Occupational History  . Not on file  Social Needs  . Financial resource strain: Not on file  . Food insecurity:    Worry: Not on file    Inability: Not on file  . Transportation needs:    Medical: Not on file    Non-medical: Not on file  Tobacco Use  . Smoking status: Current Every Day Smoker   Packs/day: 0.50    Types: Cigarettes  . Smokeless tobacco: Never Used  Substance and Sexual Activity  . Alcohol use: Not Currently    Alcohol/week: 0.0 standard drinks    Comment: Rare  . Drug use: No  . Sexual activity: Yes    Partners: Male    Birth control/protection: IUD    Comment: 1st intercourse 37 yo-Fewer than 5 partners Mirena 03/09/2017  Lifestyle  . Physical activity:    Days per week: Not on file    Minutes per session: Not on file  . Stress: Not on file  Relationships  . Social connections:    Talks on phone: Not on file    Gets together: Not on file    Attends religious service: Not on file    Active member of club or organization: Not on file    Attends meetings of clubs or organizations: Not on file    Relationship status: Not on file  Other Topics Concern  . Not on file  Social History Narrative  . Not on file    Family History  Problem Relation Age of Onset  . Diabetes Father   . Hypertension Father   . Hyperlipidemia Father   . Lupus Mother   . Heart disease Paternal Grandfather   .  Cancer Paternal Grandfather        Prostate    Health Maintenance  Topic Date Due  . PAP SMEAR  06/22/2016  . TETANUS/TDAP  04/14/2026  . INFLUENZA VACCINE  Completed  . HIV Screening  Completed    ----------------------------------------------------------------------------------------------------------------------------------------------------------------------------------------------------------------- Physical Exam BP 134/66   Pulse 91   Temp 98.4 F (36.9 C) (Oral)   Ht 5\' 4"  (1.626 m)   Wt 194 lb (88 kg)   SpO2 98%   BMI 33.30 kg/m   Physical Exam  Constitutional: She is oriented to person, place, and time. She appears well-nourished. No distress.  HENT:  Head: Normocephalic and atraumatic.  Mouth/Throat: Oropharynx is clear and moist.  Eyes: No scleral icterus.  Neck: Neck supple. No thyromegaly present.  Cardiovascular: Normal rate, regular  rhythm and normal heart sounds.  Pulmonary/Chest: Effort normal and breath sounds normal.  Lymphadenopathy:    She has cervical adenopathy.  Neurological: She is alert and oriented to person, place, and time.  Skin: Skin is warm and dry. No rash noted.  Psychiatric: She has a normal mood and affect. Her behavior is normal.    ------------------------------------------------------------------------------------------------------------------------------------------------------------------------------------------------------------------- Assessment and Plan  URI (upper respiratory infection)  Symptomatic therapy suggested: push fluids, rest, use vaporizer or mist prn and return office visit prn if symptoms persist or worsen. Lack of antibiotic effectiveness discussed with her. Call or return to clinic prn if these symptoms worsen or fail to improve as anticipated.

## 2018-03-22 NOTE — Patient Instructions (Signed)
Upper Respiratory Infection, Adult Most upper respiratory infections (URIs) are caused by a virus. A URI affects the nose, throat, and upper air passages. The most common type of URI is often called "the common cold." Follow these instructions at home:  Take medicines only as told by your doctor.  Gargle warm saltwater or take cough drops to comfort your throat as told by your doctor.  Use a warm mist humidifier or inhale steam from a shower to increase air moisture. This may make it easier to breathe.  Drink enough fluid to keep your pee (urine) clear or pale yellow.  Eat soups and other clear broths.  Have a healthy diet.  Rest as needed.  Go back to work when your fever is gone or your doctor says it is okay. ? You may need to stay home longer to avoid giving your URI to others. ? You can also wear a face mask and wash your hands often to prevent spread of the virus.  Use your inhaler more if you have asthma.  Do not use any tobacco products, including cigarettes, chewing tobacco, or electronic cigarettes. If you need help quitting, ask your doctor. Contact a doctor if:  You are getting worse, not better.  Your symptoms are not helped by medicine.  You have chills.  You are getting more short of breath.  You have brown or red mucus.  You have yellow or brown discharge from your nose.  You have pain in your face, especially when you bend forward.  You have a fever.  You have puffy (swollen) neck glands.  You have pain while swallowing.  You have white areas in the back of your throat. Get help right away if:  You have very bad or constant: ? Headache. ? Ear pain. ? Pain in your forehead, behind your eyes, and over your cheekbones (sinus pain). ? Chest pain.  You have long-lasting (chronic) lung disease and any of the following: ? Wheezing. ? Long-lasting cough. ? Coughing up blood. ? A change in your usual mucus.  You have a stiff neck.  You have  changes in your: ? Vision. ? Hearing. ? Thinking. ? Mood. This information is not intended to replace advice given to you by your health care provider. Make sure you discuss any questions you have with your health care provider. Document Released: 09/17/2007 Document Revised: 12/02/2015 Document Reviewed: 07/06/2013 Elsevier Interactive Patient Education  2018 [employer]  

## 2018-03-22 NOTE — Assessment & Plan Note (Signed)
  Symptomatic therapy suggested: push fluids, rest, use vaporizer or mist prn and return office visit prn if symptoms persist or worsen. Lack of antibiotic effectiveness discussed with her. Call or return to clinic prn if these symptoms worsen or fail to improve as anticipated.

## 2018-03-29 ENCOUNTER — Ambulatory Visit (INDEPENDENT_AMBULATORY_CARE_PROVIDER_SITE_OTHER): Payer: BLUE CROSS/BLUE SHIELD | Admitting: Gynecology

## 2018-03-29 ENCOUNTER — Encounter: Payer: Self-pay | Admitting: Gynecology

## 2018-03-29 VITALS — BP 118/76 | Ht 64.0 in | Wt 194.0 lb

## 2018-03-29 DIAGNOSIS — Z113 Encounter for screening for infections with a predominantly sexual mode of transmission: Secondary | ICD-10-CM | POA: Diagnosis not present

## 2018-03-29 DIAGNOSIS — Z01419 Encounter for gynecological examination (general) (routine) without abnormal findings: Secondary | ICD-10-CM

## 2018-03-29 DIAGNOSIS — Z1151 Encounter for screening for human papillomavirus (HPV): Secondary | ICD-10-CM | POA: Diagnosis not present

## 2018-03-29 DIAGNOSIS — Z30431 Encounter for routine checking of intrauterine contraceptive device: Secondary | ICD-10-CM

## 2018-03-29 NOTE — Progress Notes (Signed)
    Jaden Rogowski [birth year] [id number]        37 y.o.  G0P0 for annual gynecologic exam.  Without gynecologic complaints.  Being seen by dermatology for recurrent thigh boils.  Past medical history,surgical history, problem list, medications, allergies, family history and social history were all reviewed and documented as reviewed in the EPIC chart.  ROS:  Performed with pertinent positives and negatives included in the history, assessment and plan.   Additional significant findings : None   Exam: Caryn Bee assistant Vitals:   03/29/18 1452  BP: 118/76  Weight: 194 lb (88 kg)  Height: 5\' 4"  (1.626 m)   Body mass index is 33.3 kg/m.  General appearance:  Normal affect, orientation and appearance. Skin: Grossly normal HEENT: Without gross lesions.  No cervical or supraclavicular adenopathy. Thyroid normal.  Lungs:  Clear without wheezing, rales or rhonchi Cardiac: RR, without RMG Abdominal:  Soft, nontender, without masses, guarding, rebound, organomegaly or hernia Breasts:  Examined lying and sitting without masses, retractions, discharge or axillary adenopathy. Pelvic:  Ext, BUS, Vagina: Normal  Cervix: Normal.  Pap smear/HPV, GC/chlamydia.  IUD string visualized  Uterus: Anteverted, normal size, shape and contour, midline and mobile nontender   Adnexa: Without masses or tenderness    Anus and perineum: Normal    Assessment/Plan:  37 y.o. G0P0 female for annual gynecologic exam with scant spotty menses, Mirena IUD.   1. Mirena IUD 02/2017.  Doing well with spotting with menses.  IUD string visualized. 2. STD screening discussed and requested.  GC/Chlamydia screen done. 3. Breast health.  Breast exam normal today.  Plan baseline mammography at age 63. 36. Pap smear/HPV 06/2013.  Pap smear/HPV today.  History of LEEP 2010 for CIN-3 with clear margins.  Normal Pap smears since. 5. Health maintenance.  Actively being seen by dermatology for recurrent boils.  Reports routine  blood work done elsewhere.  Follow-up in 1 year, sooner as needed.   [name] MD, 3:31 PM 03/29/2018

## 2018-03-29 NOTE — Addendum Note (Signed)
Addended by: Nelva Nay on: 03/29/2018 04:11 PM   Modules accepted: Orders

## 2018-03-29 NOTE — Addendum Note (Signed)
Addended by: Nelva Nay on: 03/29/2018 04:01 PM   Modules accepted: Orders

## 2018-03-29 NOTE — Patient Instructions (Signed)
Follow-up in 1 year for annual exam, sooner as needed. 

## 2018-03-30 LAB — PAP IG AND HPV HIGH-RISK: HPV DNA High Risk: NOT DETECTED

## 2018-03-30 LAB — C. TRACHOMATIS/N. GONORRHOEAE RNA
C. TRACHOMATIS RNA, TMA: NOT DETECTED
N. gonorrhoeae RNA, TMA: NOT DETECTED

## 2018-06-25 ENCOUNTER — Encounter: Payer: Self-pay | Admitting: Obstetrics & Gynecology

## 2018-06-25 ENCOUNTER — Other Ambulatory Visit: Payer: Self-pay

## 2018-06-25 ENCOUNTER — Ambulatory Visit (INDEPENDENT_AMBULATORY_CARE_PROVIDER_SITE_OTHER): Payer: Self-pay | Admitting: Obstetrics & Gynecology

## 2018-06-25 VITALS — BP 126/86

## 2018-06-25 DIAGNOSIS — L738 Other specified follicular disorders: Secondary | ICD-10-CM

## 2018-06-25 NOTE — Progress Notes (Signed)
    [name] [birth year] [id number]        38 y.o.  G0P0 single  RP: Small tender lump on the left vulva and vulvar irritation  HPI: Small tender lump on the left vulva with vulvar irritation.  No vaginal itching and no increase in vaginal discharge.  No pelvic pain.  No fever.  Declines STD screening.  Urine and bowel movements normal.  Followed by dermatology for recurrent inguinal and pubic sebaceous gland abscesses.  Well on Mirena IUD since November 2018.   OB History  Gravida Para Term Preterm AB Living  0            SAB TAB Ectopic Multiple Live Births               Past medical history,surgical history, problem list, medications, allergies, family history and social history were all reviewed and documented in the EPIC chart.   Directed ROS with pertinent positives and negatives documented in the history of present illness/assessment and plan.  Exam:  Vitals:   06/25/18 1203  BP: 126/86   General appearance:  Normal  Abdomen: Normal  Gynecologic exam: Vulva with a small left sebaceous gland abscess.  No fluctuance.  No ulcer.  Vulva otherwise normal.  Normal vaginal secretions.   Assessment/Plan:  38 y.o. G0P0   1. Infected sebaceous gland Left vulvar small sebaceous gland abscess.  Not ready for drainage.  Recommend warm soaking and compresses.  Will use clindamycin cream as needed, prescribed by dermatology.  Follow-up next week if for worsening symptoms for possible drainage.  Patient reassured.  Counseling on above issues and coordination of care more than 50% for 15 minutes.  Princess Bruins MD, 12:09 PM 06/25/2018

## 2018-06-28 ENCOUNTER — Encounter: Payer: Self-pay | Admitting: Obstetrics & Gynecology

## 2018-06-28 NOTE — Patient Instructions (Signed)
1. Infected sebaceous gland Left vulvar small sebaceous gland abscess.  Not ready for drainage.  Recommend warm soaking and compresses.  Will use clindamycin cream as needed, prescribed by dermatology.  Follow-up next week if for worsening symptoms for possible drainage.  Patient reassured.  Zailah, it was a pleasure seeing you today!

## 2018-08-31 ENCOUNTER — Telehealth: Payer: Self-pay | Admitting: Neurology

## 2018-08-31 NOTE — Telephone Encounter (Signed)
Due to current COVID 19 pandemic, our office is severely reducing in office visits until further notice, in order to minimize the risk to our patients and healthcare providers.   I called patient to offer a sooner appt via virtual visit. Patient explained that she has lost insurance coverage and has stopped using her autuo pap. She states that she does not feel a need to have an appointment at this time. I advised her that I would cancel this appointment and she should call back if she needs anything in the future.

## 2018-09-15 ENCOUNTER — Ambulatory Visit: Payer: BLUE CROSS/BLUE SHIELD | Admitting: Neurology

## 2019-01-04 ENCOUNTER — Encounter: Payer: Self-pay | Admitting: Gynecology

## 2019-02-21 ENCOUNTER — Other Ambulatory Visit: Payer: Self-pay

## 2019-02-21 DIAGNOSIS — Z20822 Contact with and (suspected) exposure to covid-19: Secondary | ICD-10-CM

## 2019-02-22 LAB — NOVEL CORONAVIRUS, NAA: SARS-CoV-2, NAA: NOT DETECTED

## 2019-03-31 ENCOUNTER — Encounter: Payer: Self-pay | Admitting: Gynecology

## 2019-03-31 ENCOUNTER — Ambulatory Visit (INDEPENDENT_AMBULATORY_CARE_PROVIDER_SITE_OTHER): Payer: Self-pay | Admitting: Gynecology

## 2019-03-31 ENCOUNTER — Other Ambulatory Visit: Payer: Self-pay

## 2019-03-31 VITALS — BP 124/84 | Ht 64.0 in | Wt 204.0 lb

## 2019-03-31 DIAGNOSIS — Z01419 Encounter for gynecological examination (general) (routine) without abnormal findings: Secondary | ICD-10-CM

## 2019-03-31 DIAGNOSIS — Z30431 Encounter for routine checking of intrauterine contraceptive device: Secondary | ICD-10-CM

## 2019-03-31 NOTE — Progress Notes (Signed)
    [name] 09/01/1980 [id number]        38 y.o.  G0P0 for annual gynecologic exam.  Without gynecologic complaints  Past medical history,surgical history, problem list, medications, allergies, family history and social history were all reviewed and documented as reviewed in the EPIC chart.  ROS:  Performed with pertinent positives and negatives included in the history, assessment and plan.   Additional significant findings : None   Exam: Caryn Bee assistant Vitals:   03/31/19 1546  BP: 124/84  Weight: 204 lb (92.5 kg)  Height: 5\' 4"  (1.626 m)   Body mass index is 35.02 kg/m.  General appearance:  Normal affect, orientation and appearance. Skin: Grossly normal HEENT: Without gross lesions.  No cervical or supraclavicular adenopathy. Thyroid normal.  Lungs:  Clear without wheezing, rales or rhonchi Cardiac: RR, without RMG Abdominal:  Soft, nontender, without masses, guarding, rebound, organomegaly or hernia Breasts:  Examined lying and sitting without masses, retractions, discharge or axillary adenopathy. Pelvic:  Ext, BUS, Vagina: Normal  Cervix: Normal.  IUD string visualized  Uterus: Grossly normal midline mobile nontender  Adnexa: Without masses or tenderness    Anus and perineum: Normal   Rectovaginal: Normal sphincter tone without palpated masses or tenderness.    Assessment/Plan:  38 y.o. G0P0 female for annual gynecologic exam.  Without menses, Mirena IUD  1. Mirena IUD 02/2017.  Remains without menses.  IUD string visualized. 2. Pap smear/HPV 03/2018.  No Pap smear done today.  History of LEEP for CIN-3 2010 with clear margins.  Normal Pap smears since.  Plan repeat Pap smear/HPV at 5-year interval per current screening guidelines. 3. Breast health.  SBE monthly reviewed.  Breast exam normal today.  Plan baseline mammogram at age 38. 53. STD screening discussed offered and declined. 5. Health maintenance.  No routine lab work done as patient reports this  done elsewhere.  Follow-up 1 year, sooner as needed.   [name] MD, 4:07 PM 03/31/2019

## 2019-03-31 NOTE — Patient Instructions (Signed)
Follow-up in 1 year for annual exam 

## 2019-04-19 ENCOUNTER — Other Ambulatory Visit: Payer: Self-pay

## 2019-04-19 ENCOUNTER — Encounter: Payer: Self-pay | Admitting: Women's Health

## 2019-04-19 ENCOUNTER — Ambulatory Visit (INDEPENDENT_AMBULATORY_CARE_PROVIDER_SITE_OTHER): Payer: Self-pay | Admitting: Women's Health

## 2019-04-19 ENCOUNTER — Telehealth: Payer: Self-pay | Admitting: *Deleted

## 2019-04-19 VITALS — BP 128/80

## 2019-04-19 DIAGNOSIS — N760 Acute vaginitis: Secondary | ICD-10-CM

## 2019-04-19 DIAGNOSIS — B9689 Other specified bacterial agents as the cause of diseases classified elsewhere: Secondary | ICD-10-CM

## 2019-04-19 LAB — WET PREP FOR TRICH, YEAST, CLUE

## 2019-04-19 MED ORDER — METRONIDAZOLE 500 MG PO TABS: 500.0000 mg | ORAL_TABLET | Freq: Two times a day (BID) | ORAL | 0 refills | Status: DC

## 2019-04-19 MED ORDER — PROMETHAZINE HCL 50 MG PO TABS: 50.0000 mg | ORAL_TABLET | Freq: Four times a day (QID) | ORAL | 0 refills | Status: DC | PRN

## 2019-04-19 MED ORDER — METRONIDAZOLE 0.75 % VA GEL: VAGINAL | 0 refills | Status: DC

## 2019-04-19 NOTE — Telephone Encounter (Signed)
Patient called c/o vaginal odor, has IUD and also reports bleeding, has had spotting in past, but no bleeding. I called and received her voicemail and I told her best to schedule OV.

## 2019-04-19 NOTE — Patient Instructions (Signed)
Bacterial Vaginosis  Bacterial vaginosis is a vaginal infection that occurs when the normal balance of bacteria in the vagina is disrupted. It results from an overgrowth of certain bacteria. This is the most common vaginal infection among women ages 15-44. Because bacterial vaginosis increases your risk for STIs (sexually transmitted infections), getting treated can help reduce your risk for chlamydia, gonorrhea, herpes, and HIV (human immunodeficiency virus). Treatment is also important for preventing complications in pregnant women, because this condition can cause an early (premature) delivery. What are the causes? This condition is caused by an increase in harmful bacteria that are normally present in small amounts in the vagina. However, the reason that the condition develops is not fully understood. What increases the risk? The following factors may make you more likely to develop this condition:  Having a new sexual partner or multiple sexual partners.  Having unprotected sex.  Douching.  Having an intrauterine device (IUD).  Smoking.  Drug and alcohol abuse.  Taking certain antibiotic medicines.  Being pregnant. You cannot get bacterial vaginosis from toilet seats, bedding, swimming pools, or contact with objects around you. What are the signs or symptoms? Symptoms of this condition include:  Grey or white vaginal discharge. The discharge can also be watery or foamy.  A fish-like odor with discharge, especially after sexual intercourse or during menstruation.  Itching in and around the vagina.  Burning or pain with urination. Some women with bacterial vaginosis have no signs or symptoms. How is this diagnosed? This condition is diagnosed based on:  Your medical history.  A physical exam of the vagina.  Testing a sample of vaginal fluid under a microscope to look for a large amount of bad bacteria or abnormal cells. Your health care provider may use a cotton swab or  a small wooden spatula to collect the sample. How is this treated? This condition is treated with antibiotics. These may be given as a pill, a vaginal cream, or a medicine that is put into the vagina (suppository). If the condition comes back after treatment, a second round of antibiotics may be needed. Follow these instructions at home: Medicines  Take over-the-counter and prescription medicines only as told by your health care provider.  Take or use your antibiotic as told by your health care provider. Do not stop taking or using the antibiotic even if you start to feel better. General instructions  If you have a female sexual partner, tell her that you have a vaginal infection. She should see her health care provider and be treated if she has symptoms. If you have a female sexual partner, he does not need treatment.  During treatment: ? Avoid sexual activity until you finish treatment. ? Do not douche. ? Avoid alcohol as directed by your health care provider. ? Avoid breastfeeding as directed by your health care provider.  Drink enough water and fluids to keep your urine clear or pale yellow.  Keep the area around your vagina and rectum clean. ? Wash the area daily with warm water. ? Wipe yourself from front to back after using the toilet.  Keep all follow-up visits as told by your health care provider. This is important. How is this prevented?  Do not douche.  Wash the outside of your vagina with warm water only.  Use protection when having sex. This includes latex condoms and dental dams.  Limit how many sexual partners you have. To help prevent bacterial vaginosis, it is best to have sex with just one partner (  monogamous).  Make sure you and your sexual partner are tested for STIs.  Wear cotton or cotton-lined underwear.  Avoid wearing tight pants and pantyhose, especially during summer.  Limit the amount of alcohol that you drink.  Do not use any products that contain  nicotine or tobacco, such as cigarettes and e-cigarettes. If you need help quitting, ask your health care provider.  Do not use illegal drugs. Where to find more information  Centers for Disease Control and Prevention: www.cdc.gov/std  American Sexual Health Association (ASHA): www.ashastd.org  U.S. Department of Health and Human Services, Office on Women's Health: www.womenshealth.gov/ or https://www.womenshealth.gov/a-z-topics/bacterial-vaginosis Contact a health care provider if:  Your symptoms do not improve, even after treatment.  You have more discharge or pain when urinating.  You have a fever.  You have pain in your abdomen.  You have pain during sex.  You have vaginal bleeding between periods. Summary  Bacterial vaginosis is a vaginal infection that occurs when the normal balance of bacteria in the vagina is disrupted.  Because bacterial vaginosis increases your risk for STIs (sexually transmitted infections), getting treated can help reduce your risk for chlamydia, gonorrhea, herpes, and HIV (human immunodeficiency virus). Treatment is also important for preventing complications in pregnant women, because the condition can cause an early (premature) delivery.  This condition is treated with antibiotic medicines. These may be given as a pill, a vaginal cream, or a medicine that is put into the vagina (suppository). This information is not intended to replace advice given to you by your health care provider. Make sure you discuss any questions you have with your health care provider. Document Revised: 03/13/2017 Document Reviewed: 12/15/2015 Elsevier Patient Education  2020 Elsevier Inc.  

## 2019-04-19 NOTE — Progress Notes (Signed)
39 year old S WF G0 presents with increased vaginal discharge with odor for the past 2 weeks.  Denies urinary symptoms, abdominal pain, or fever.  02/2017 Mirena IUD has been amenorrheic but has had increased bleeding recently.  Annual exam 2 we vaginal itching, eks ago was having no issues at that time.  Denies change in routine, has not tried anything over-the-counter.  Same partner.  Works at an [employer].  No medical problems, smoker.  Exam: Appears well.  No CVAT.  Abdomen soft, obese, nontender, no rebound.  External genitalia within normal limits, speculum exam scant bloody discharge with odor noted, IUD strings visible.  Bimanual no CMT or adnexal tenderness.  Wet prep positive: Clues, TNTC bacteria.  Bacterial vaginosis 02/2017 Mirena IUD irregular bleeding  Plan: Does give nausea with some vomiting with Flagyl, options reviewed MetroGel vaginal cream 1 applicator at bedtime x5 and a prescription for Phenergan 50 mg to take at bedtime.  Reviewed the Flagyl allergy is more side effect than true allergy.  Instructed to call if cannot complete medication or if symptoms persist.  Reviewed it is common to have some irregular bleeding with the Mirena IUD but if bleeding persists instructed to call.

## 2019-04-22 ENCOUNTER — Telehealth: Payer: Self-pay | Admitting: *Deleted

## 2019-04-22 MED ORDER — METRONIDAZOLE 0.75 % VA GEL: VAGINAL | 0 refills | Status: DC

## 2019-04-22 NOTE — Telephone Encounter (Signed)
Patient called stating [facility] never received the Rx for metrogel vaginal cream 0.75% on office visit 04/19/19. I called to confirm this and they did not have Rx. Rx was set on normal, not sure why it was not received, I was asked to re-send. I called patient and told her this has been done. Rx-resent . Patient report she was not able to take Flagyl 500 mg due to side effects.

## 2019-04-25 ENCOUNTER — Telehealth: Payer: Self-pay | Admitting: *Deleted

## 2019-04-25 MED ORDER — FLUCONAZOLE 150 MG PO TABS: 150.0000 mg | ORAL_TABLET | Freq: Every day | ORAL | 0 refills | Status: DC

## 2019-04-25 NOTE — Telephone Encounter (Signed)
Okay for Diflucan 150 mg 1 dose.  Review some of the discharges from the MetroGel which does often look like a white thick discharge.

## 2019-04-25 NOTE — Telephone Encounter (Signed)
Patient was treated for BV with metrogel vaginal cream 0.075% on day 3 of Rx and now has itching and thick white discharge and irritation.  Please advise

## 2019-04-25 NOTE — Telephone Encounter (Signed)
Patient informed, Rx sent.  

## 2019-09-07 ENCOUNTER — Other Ambulatory Visit: Payer: Self-pay | Admitting: *Deleted

## 2020-07-31 ENCOUNTER — Ambulatory Visit: Payer: Self-pay | Admitting: Nurse Practitioner

## 2020-11-12 DIAGNOSIS — B009 Herpesviral infection, unspecified: Secondary | ICD-10-CM

## 2020-11-12 HISTORY — DX: Herpesviral infection, unspecified: B00.9

## 2020-11-30 ENCOUNTER — Telehealth: Payer: Self-pay | Admitting: *Deleted

## 2020-11-30 NOTE — Telephone Encounter (Signed)
Pt called with complaints of possible UTI symptoms include: dysuria,vaginal discharge. Pt will make appointment for further evaluation.

## 2020-12-03 ENCOUNTER — Ambulatory Visit (INDEPENDENT_AMBULATORY_CARE_PROVIDER_SITE_OTHER): Payer: 59 | Admitting: Nurse Practitioner

## 2020-12-03 ENCOUNTER — Encounter: Payer: Self-pay | Admitting: Nurse Practitioner

## 2020-12-03 ENCOUNTER — Other Ambulatory Visit: Payer: Self-pay

## 2020-12-03 VITALS — BP 122/78

## 2020-12-03 DIAGNOSIS — N9089 Other specified noninflammatory disorders of vulva and perineum: Secondary | ICD-10-CM

## 2020-12-03 MED ORDER — VALACYCLOVIR HCL 500 MG PO TABS: 500.0000 mg | ORAL_TABLET | Freq: Two times a day (BID) | ORAL | 1 refills | Status: DC

## 2020-12-03 NOTE — Progress Notes (Signed)
   Acute Office Visit  Subjective:    Patient ID: [name], female    DOB: 27-Jun-1980, 40 y.o.   MRN: [id number]   HPI 40 y.o. presents today for  vaginal pain and burning with urination. She was prescribed clindamycin 11/26/2020 for throat abscess and developed diarrhea. A couple of days later she developed severe vaginal pain that started as burning with urination and now is constant. She returned to UC twice more and was treated for yeast infection with Diflucan x 3 and had negative UA. Viral cultures pending (we do not have records). Sexually active with one partner. He has HSV-1.    Review of Systems  Constitutional: Negative.   Genitourinary:  Positive for dysuria, genital sores and vaginal pain. Negative for frequency, hematuria, urgency, vaginal bleeding and vaginal discharge.      Objective:    Physical Exam Constitutional:      Appearance: Normal appearance.  Genitourinary:    Labia:        Right: Lesion present.        Left: Lesion present.       BP 122/78  Wt Readings from Last 3 Encounters:  03/31/19 204 lb (92.5 kg)  03/29/18 194 lb (88 kg)  03/22/18 194 lb (88 kg)        Assessment & Plan:   Problem List Items Addressed This Visit   None Visit Diagnoses     Vulvar lesion    -  Primary   Relevant Medications   valACYclovir (VALTREX) 500 MG tablet   Other Relevant Orders   SureSwab HSV, Type 1/2 DNA, PCR      Plan: Exam and symptoms consistent with HSV. She requested repeat cultures today so these were obtained. Started on Valtrex 500 mg twice daily x 3-5 days. Will reach out with culture results. Continue lidocaine gel as needed for pain. She is agreeable to plan.      [place], 4:06 PM 12/03/2020

## 2020-12-04 ENCOUNTER — Telehealth: Payer: Self-pay

## 2020-12-04 ENCOUNTER — Ambulatory Visit: Payer: Self-pay | Admitting: Nurse Practitioner

## 2020-12-04 NOTE — Telephone Encounter (Signed)
Patient called for several things.  #1 She wanted you to know she is having some issues with occasional urinary incontinence and that is causing her urethra to burn. She said it is so painful that "I can barely breathe.".  She is asking can you prescribed anything else. Ibuprofen and Lidocaine is not helping at all. She said she had some Oxycodone/Tylenol left from a previous procedure and took that last night and seemed to help somewhat but not as much as she would have expected. It did not take pain away.  #2. She wants to see if you will (if lab is able) to add testing for Shingles to her specimen. She said she has a history of Shingles and this seems to have similar presentation.   #3. She wanted to know if any other STD testing done and I did inform her it appeared HSV testing was the only test performed.  Patient is in a meeting 3-4pm.

## 2020-12-05 ENCOUNTER — Encounter: Payer: Self-pay | Admitting: Nurse Practitioner

## 2020-12-05 LAB — SURESWAB HSV, TYPE 1/2 DNA, PCR
HSV 1 DNA: DETECTED — AB
HSV 2 DNA: NOT DETECTED

## 2020-12-05 NOTE — Telephone Encounter (Signed)
Patient send a My Chart message this morning and I replied to her there with Tiffany's reply.

## 2020-12-05 NOTE — Telephone Encounter (Signed)
The incontinence is likely related to anxiety/fear over urinating due to pain, so she may be holding urine until her bladder is overfilling. Other than Tylenol and Ibuprofen there is nothing to prescribe for that type of pain. I recommend those in conjunction with the lidocaine gel as the lidocaine is the best choice for this pain. She can use the gel as often as needed. She should start having improvement in her pain within the next day or so.  It is uncommon to have shingles in the genital area but can happen. I will ask [name] if this can be added to the specimen but reassure her that the treatment for shingles would be the same as the treatment she is currently on.  No other STD testing was performed. Her symptoms were related to the active lesions. If she wants to return for STD screening she is welcome to.

## 2020-12-06 ENCOUNTER — Encounter: Payer: Self-pay | Admitting: Nurse Practitioner

## 2020-12-06 NOTE — Telephone Encounter (Signed)
Patient recently diagnosed with HSV-1 with Marny Lowenstein, NP.

## 2020-12-07 MED ORDER — TRAMADOL HCL 50 MG PO TABS: 50.0000 mg | ORAL_TABLET | Freq: Four times a day (QID) | ORAL | 0 refills | Status: DC | PRN

## 2020-12-07 MED ORDER — VALACYCLOVIR HCL 1 G PO TABS: 1000.0000 mg | ORAL_TABLET | Freq: Two times a day (BID) | ORAL | 0 refills | Status: DC

## 2020-12-07 MED ORDER — LIDOCAINE 5 % EX OINT: 1.0000 "application " | TOPICAL_OINTMENT | Freq: Four times a day (QID) | CUTANEOUS | 0 refills | Status: DC | PRN

## 2020-12-07 MED ORDER — IBUPROFEN 800 MG PO TABS: 800.0000 mg | ORAL_TABLET | Freq: Three times a day (TID) | ORAL | 0 refills | Status: DC | PRN

## 2020-12-07 NOTE — Telephone Encounter (Signed)
With a primary outbreak of HSV, she should be on Valtrex, 1 gram BID for 7-10 days. Please call in the higher dose of valtrex for her for the next 7 days.  The Accutane should not be interfering with the Valtrex.  I think she is using the over the counter lidocaine. Please call in lidocaine ointment 5%, apply qid prn, #30, no refills (have her use good RX). It may burn when she puts it on, but then should provide relief.  Please also call in ibuprofen 800 mg po q 8 hours prn, #30, no refills.  She should use sitz baths, void in water. I have also called in Tramadol for her to use s needed.

## 2021-02-06 ENCOUNTER — Other Ambulatory Visit: Payer: Self-pay | Admitting: Obstetrics and Gynecology

## 2021-02-06 NOTE — Telephone Encounter (Signed)
Scheduled AEX 04/04/21 w ML. Last AEX 03/31/2019 w TF.

## 2021-03-11 ENCOUNTER — Telehealth: Payer: Self-pay | Admitting: Family Medicine

## 2021-03-11 NOTE — Telephone Encounter (Signed)
Okay to schedule NP appt at her convenience.  

## 2021-03-11 NOTE — Telephone Encounter (Signed)
Patient called stating her dad [name], ask Dr. Larose Kells to take her on as a new patient and he agreed. Please advice.

## 2021-03-11 NOTE — Telephone Encounter (Signed)
Please advise 

## 2021-03-11 NOTE — Telephone Encounter (Signed)
It is okay, please make her aware that she will need to keep a gynecologist

## 2021-03-19 DIAGNOSIS — F909 Attention-deficit hyperactivity disorder, unspecified type: Secondary | ICD-10-CM | POA: Insufficient documentation

## 2021-03-25 ENCOUNTER — Encounter: Payer: Self-pay | Admitting: Internal Medicine

## 2021-03-25 ENCOUNTER — Ambulatory Visit (INDEPENDENT_AMBULATORY_CARE_PROVIDER_SITE_OTHER): Payer: 59 | Admitting: Internal Medicine

## 2021-03-25 VITALS — BP 124/80 | HR 122 | Temp 98.1°F | Resp 16 | Ht 64.0 in | Wt 194.1 lb

## 2021-03-25 DIAGNOSIS — J312 Chronic pharyngitis: Secondary | ICD-10-CM

## 2021-03-25 DIAGNOSIS — Z23 Encounter for immunization: Secondary | ICD-10-CM

## 2021-03-25 DIAGNOSIS — B009 Herpesviral infection, unspecified: Secondary | ICD-10-CM | POA: Insufficient documentation

## 2021-03-25 DIAGNOSIS — R000 Tachycardia, unspecified: Secondary | ICD-10-CM | POA: Diagnosis not present

## 2021-03-25 DIAGNOSIS — L732 Hidradenitis suppurativa: Secondary | ICD-10-CM

## 2021-03-25 NOTE — Progress Notes (Signed)
Subjective:    Patient ID: [name], female    DOB: 1981/01/13, 40 y.o.   MRN: [id number]  DOS:  03/25/2021 Type of visit - description: new patient, to establish care   Here to get established. Several issues discussed.  3 days ago woke up with a sore throat, described the pain as located near the uvula, also had pain at the left anterior neck and some swelling there. This is not the first this happened, had a similar episode in October. She denies fever chills No GERD symptoms No cough. No dental pain. She has a list history of sleep apnea, not using a CPAP.  She was noted to be tachycardic, denies any chest pain, difficulty breathing or palpitations.    Records reviewed. Multiple records from urgent care reviewed. Multiples episodes of sore throat,  Dx with peritonsillar abscess and cellulitis on 06/16/2018.  Treated with clindamycin. Seen 12/02/2020: Dx herpes vaginalis, Rx valacyclovir 02/13/2021: Seen with fatigue, heart rate was 108, found to have lymphadenopathy at the neck, treated with cephalexin.      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/29/2012   Kidney stones    Migraines    Sleep apnea     Past Surgical History:  Procedure Laterality Date   ANTERIOR CRUCIATE LIGAMENT REPAIR  1998   LEFT   CERVICAL BIOPSY  W/ LOOP ELECTRODE EXCISION  11/2008   CIN 3 free margins   COLPOSCOPY     Cyst removed from groin     INTRAUTERINE DEVICE INSERTION  03/09/2017   Mirena   KNEE ARTHROCENTESIS  2006   MOUTH SURGERY     PILONIDAL CYST EXCISION  1999   Social  History   Socioeconomic History   Marital status: Single    Spouse name: Not on file   Number of children: Not on file   Years of education: Not on file   Highest education level: Not on file  Occupational History   Not on file  Tobacco Use   Smoking status: Every Day    Packs/day: 0.50    Types: Cigarettes   Smokeless tobacco: Never   Tobacco comments:    Started smoking ~ 40 y/o  Vaping Use   Vaping Use: Never used  Substance and Sexual Activity   Alcohol use: Yes    Alcohol/week: 0.0 standard drinks    Comment: Rare   Drug use: No   Sexual activity: Yes    Partners: Male    Birth control/protection: I.U.D.    Comment: 1st intercourse 40 yo-more than 5 partners Mirena 03/09/2017  Other Topics Concern   Not on file  Social History Narrative   Household- pt and boyfriend    Works at the [employer] Strain: Not on file  Food Insecurity: Not on file  Transportation Needs: Not on file  Physical Activity: Not on file  Stress: Not on file  Social Connections: Not on file  Intimate Partner Violence: Not on file  Family History  Problem Relation Age of Onset   Lupus Mother    Diabetes Father    Hypertension Father    Hyperlipidemia Father    Heart disease Paternal Grandfather    Cancer Paternal Grandfather        Prostate    Allergies as of 03/25/2021       Reactions   Ciprofloxacin Hives   Clindamycin/lincomycin Diarrhea   Doxycycline Nausea And Vomiting   Metronidazole Nausea And Vomiting   Other    Mycin drugs-Nausea   Penicillins Nausea And Vomiting   Sulfa Antibiotics Hives   Mouth ulcers        Medication List        Accurate as of March 25, 2021 11:59 PM. If you have any questions, ask your nurse or doctor.          STOP taking these medications    fluconazole 150 MG tablet Commonly known as: Diflucan Stopped by: Kathlene November, MD   ibuprofen 800 MG tablet Commonly known  as: ADVIL Stopped by: Kathlene November, MD   lidocaine 5 % ointment Commonly known as: XYLOCAINE Stopped by: Kathlene November, MD   Myorisan 40 MG capsule Generic drug: ISOtretinoin Stopped by: Kathlene November, MD   promethazine 50 MG tablet Commonly known as: PHENERGAN Stopped by: Kathlene November, MD   terconazole 0.8 % vaginal cream Commonly known as: TERAZOL 3 Stopped by: Kathlene November, MD   traMADol 50 MG tablet Commonly known as: Market researcher Stopped by: Kathlene November, MD   valACYclovir 1000 MG tablet Commonly known as: VALTREX Stopped by: Kathlene November, MD       TAKE these medications    amphetamine-dextroamphetamine 20 MG tablet Commonly known as: ADDERALL Take 20 mg by mouth 3 (three) times daily.   clonazePAM 0.5 MG tablet Commonly known as: KLONOPIN Take 0.5 mg by mouth at bedtime as needed for anxiety.   levonorgestrel 20 MCG/24HR IUD Commonly known as: MIRENA 1 each by Intrauterine route once.           Objective:   Physical Exam BP 124/80 (BP Location: Left Arm, Patient Position: Sitting, Cuff Size: Small)   Pulse (!) 122   Temp 98.1 F (36.7 C) (Oral)   Resp 16   Ht 5\' 4"  (1.626 m)   Wt 194 lb 2 oz (88.1 kg)   SpO2 97%   BMI 33.32 kg/m  General:   Well developed, NAD, BMI noted. HEENT:  Normocephalic . Face symmetric, atraumatic Ears: R TMs obscured by wax, L TM normal Throat: Symmetric, tonsils not seen (they are small, she never had surgery), minimal redness with no white patches.  Uvula midline. Neck: No thyromegaly, no lymphadenopathies.  Supraclavicular area is free of mass. Lungs:  CTA B Normal respiratory effort, no intercostal retractions, no accessory muscle use. Heart: RRR,  no murmur.  Lower extremities: no pretibial edema bilaterally  Skin: Not pale. Not jaundice Neurologic:  alert & oriented X3.  Speech normal, gait appropriate for age and unassisted Psych--  Cognition and judgment appear intact.  Cooperative with normal attention span and concentration.   Behavior appropriate. No anxious or depressed appearing.      Assessment    Assessment (new patient 03/25/2021, transferred from another office) Anxiety ADD  (see Dr Toy Care) OSA: dx ~ 2019, failed a brief trial with a CPAP, not using as off 03-2021 Hydradenitis suppurativa Birth control: IUD  PLAN New patient, referred by her father. Sore throat, neck pain: As described above, had a  similar episode in October, suspect symptoms related to untreated OSA, she is probably stretching the uvula with severe snoring.  Exam today showed no lump, no thyromegaly.  We agreed that is imperative she start using a CPAP.  She will monitor the area for actual lumps or swelling, if that is the case we will need to see ENT. (On reviewing her records, she has been seen in UC frequently w/ sore throat) OSA:  Reports she temporarily used a CPAP but because she was not 100% compliant her insurance declined to cover. Recommend to reach out to her insurance and neurology, is very important she gets treated. Tachycardia: Heart rate elevated, no symptoms, on Adderall, probably related. EKG today: Sinus tachycardia Plan: CMP, CBC, TSH Hidradenitis suppurativa: Long history of such, has seen dermatology, needed I&D few times, currently the only spot that is bothering her is her R groin on and off.  Has been referred to [facility]. Preventive care: Flu shot today, COVID booster rec for 04-2021 (s/p COVID infection 02-2021) RTC 3 months CPE  This visit occurred during the SARS-CoV-2 public health emergency.  Safety protocols were in place, including screening questions prior to the visit, additional usage of staff PPE, and extensive cleaning of exam room while observing appropriate contact time as indicated for disinfecting solutions.

## 2021-03-25 NOTE — Patient Instructions (Signed)
    GO TO THE LAB : Get the blood work     GO TO THE FRONT DESK, PLEASE SCHEDULE YOUR APPOINTMENTS Come back for   physical exam in 3 months

## 2021-03-26 ENCOUNTER — Telehealth: Payer: Self-pay | Admitting: Internal Medicine

## 2021-03-26 ENCOUNTER — Encounter: Payer: Self-pay | Admitting: Internal Medicine

## 2021-03-26 ENCOUNTER — Other Ambulatory Visit (INDEPENDENT_AMBULATORY_CARE_PROVIDER_SITE_OTHER): Payer: 59

## 2021-03-26 DIAGNOSIS — J312 Chronic pharyngitis: Secondary | ICD-10-CM

## 2021-03-26 DIAGNOSIS — R739 Hyperglycemia, unspecified: Secondary | ICD-10-CM

## 2021-03-26 DIAGNOSIS — M542 Cervicalgia: Secondary | ICD-10-CM

## 2021-03-26 DIAGNOSIS — L732 Hidradenitis suppurativa: Secondary | ICD-10-CM | POA: Insufficient documentation

## 2021-03-26 DIAGNOSIS — Z09 Encounter for follow-up examination after completed treatment for conditions other than malignant neoplasm: Secondary | ICD-10-CM | POA: Insufficient documentation

## 2021-03-26 LAB — COMPREHENSIVE METABOLIC PANEL
ALT: 11 U/L (ref 0–35)
AST: 11 U/L (ref 0–37)
Albumin: 4.4 g/dL (ref 3.5–5.2)
Alkaline Phosphatase: 65 U/L (ref 39–117)
BUN: 10 mg/dL (ref 6–23)
CO2: 30 mEq/L (ref 19–32)
Calcium: 9.8 mg/dL (ref 8.4–10.5)
Chloride: 101 mEq/L (ref 96–112)
Creatinine, Ser: 0.77 mg/dL (ref 0.40–1.20)
GFR: 97.57 mL/min (ref >60.00)
Glucose, Bld: 148 mg/dL — ABNORMAL HIGH (ref 70–99)
Potassium: 4.1 mEq/L (ref 3.5–5.1)
Sodium: 140 mEq/L (ref 135–145)
Total Bilirubin: 0.2 mg/dL (ref 0.2–1.2)
Total Protein: 7 g/dL (ref 6.0–8.3)

## 2021-03-26 LAB — CBC WITH DIFFERENTIAL/PLATELET
Basophils Absolute: 0.1 10*3/uL (ref 0.0–0.1)
Basophils Relative: 0.6 % (ref 0.0–3.0)
Eosinophils Absolute: 0.1 10*3/uL (ref 0.0–0.7)
Eosinophils Relative: 0.8 % (ref 0.0–5.0)
HCT: 39.4 % (ref 36.0–46.0)
Hemoglobin: 13 g/dL (ref 12.0–15.0)
Lymphocytes Relative: 21.5 % (ref 12.0–46.0)
Lymphs Abs: 2.5 10*3/uL (ref 0.7–4.0)
MCHC: 33 g/dL (ref 30.0–36.0)
MCV: 87 fl (ref 78.0–100.0)
Monocytes Absolute: 0.5 10*3/uL (ref 0.1–1.0)
Monocytes Relative: 4.4 % (ref 3.0–12.0)
Neutro Abs: 8.4 10*3/uL — ABNORMAL HIGH (ref 1.4–7.7)
Neutrophils Relative %: 72.7 % (ref 43.0–77.0)
Platelets: 522 10*3/uL — ABNORMAL HIGH (ref 150.0–400.0)
RBC: 4.53 Mil/uL (ref 3.87–5.11)
RDW: 14.1 % (ref 11.5–15.5)
WBC: 11.6 10*3/uL — ABNORMAL HIGH (ref 4.0–10.5)

## 2021-03-26 LAB — TSH: TSH: 0.92 u[IU]/mL (ref 0.35–5.50)

## 2021-03-26 LAB — HEMOGLOBIN A1C: Hgb A1c MFr Bld: 6.7 % — ABNORMAL HIGH (ref 4.6–6.5)

## 2021-03-26 NOTE — Telephone Encounter (Signed)
ENT referral placed.

## 2021-03-26 NOTE — Assessment & Plan Note (Signed)
New patient, referred by her father. Sore throat, neck pain: As described above, had a similar episode in October, suspect symptoms related to untreated OSA, she is probably stretching the uvula with severe snoring.  Exam today showed no lump, no thyromegaly.  We agreed that is imperative she start using a CPAP.  She will monitor the area for actual lumps or swelling, if that is the case we will need to see ENT. (On reviewing her records, she has been seen in UC frequently w/ sore throat) OSA:  Reports she temporarily used a CPAP but because she was not 100% compliant her insurance declined to cover. Recommend to reach out to her insurance and neurology, is very important she gets treated. Tachycardia: Heart rate elevated, no symptoms, on Adderall, probably related. EKG today: Sinus tachycardia Plan: CMP, CBC, TSH Hidradenitis suppurativa: Long history of such, has seen dermatology, needed I&D few times, currently the only spot that is bothering her is her R groin on and off.  Has been referred to [facility]. Preventive care: Flu shot today, COVID booster rec for 04-2021 (s/p COVID infection 02-2021) RTC 3 months CPE

## 2021-03-26 NOTE — Telephone Encounter (Signed)
Please advise 

## 2021-03-26 NOTE — Telephone Encounter (Signed)
Arrange ENT referral, DX chronic sore throat and anterior neck pain

## 2021-03-26 NOTE — Telephone Encounter (Signed)
Pt was requesting a ear, nose, and throat referral because she is in a lot of pain and the swelling is worse.

## 2021-04-01 ENCOUNTER — Encounter: Payer: Self-pay | Admitting: Nurse Practitioner

## 2021-04-01 ENCOUNTER — Ambulatory Visit (INDEPENDENT_AMBULATORY_CARE_PROVIDER_SITE_OTHER): Payer: 59 | Admitting: Nurse Practitioner

## 2021-04-01 ENCOUNTER — Other Ambulatory Visit (HOSPITAL_COMMUNITY)
Admission: RE | Admit: 2021-04-01 | Discharge: 2021-04-01 | Disposition: A | Discharge status: Home / Self Care | Payer: 59 | Source: Ambulatory Visit | Attending: Obstetrics & Gynecology | Admitting: Obstetrics & Gynecology

## 2021-04-01 ENCOUNTER — Other Ambulatory Visit: Payer: Self-pay

## 2021-04-01 VITALS — BP 130/84 | Ht 64.0 in | Wt 194.0 lb

## 2021-04-01 DIAGNOSIS — A609 Anogenital herpesviral infection, unspecified: Secondary | ICD-10-CM

## 2021-04-01 DIAGNOSIS — Z01419 Encounter for gynecological examination (general) (routine) without abnormal findings: Secondary | ICD-10-CM | POA: Diagnosis present

## 2021-04-01 DIAGNOSIS — Z30431 Encounter for routine checking of intrauterine contraceptive device: Secondary | ICD-10-CM | POA: Diagnosis not present

## 2021-04-01 MED ORDER — VALACYCLOVIR HCL 1 G PO TABS
1000.0000 mg | ORAL_TABLET | Freq: Every day | ORAL | 1 refills | Status: AC
Start: 2021-04-01 — End: 2021-04-06

## 2021-04-01 NOTE — Progress Notes (Signed)
° °  [name] May 03, 1980 [id number]   History:  40 y.o. G0 presents for annual exam. Mirena IUD 02/2017. 2010 LEEP CIN-3, subsequent paps normal. HSV 1. She is now seeing specialist for hydradenitis suppurativa, referred by dermatology.   Gynecologic History No LMP recorded. (Menstrual status: IUD).   Contraception/Family planning: IUD Sexually active: Yes  Health Maintenance Last Pap: 03/29/2018. Results were: Normal, 3-year repeat Last mammogram: Not indicated Last colonoscopy: Not indicated Last Dexa: Not indicated  Past medical history, past surgical history, family history and social history were all reviewed and documented in the EPIC chart. Long-term boyfriend who has 31 yo son. 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   ROS:  A ROS was performed and pertinent positives and negatives are included.  Exam:  Vitals:   04/01/21 1133  BP: 130/84  Weight: 194 lb (88 kg)  Height: 5\' 4"  (1.626 m)   Body mass index is 33.3 kg/m.  General appearance:  Normal Thyroid:  Symmetrical, normal in size, without palpable masses or nodularity. Respiratory  Auscultation:  Clear without wheezing or rhonchi Cardiovascular  Auscultation:  Regular rate, without rubs, murmurs or gallops  Edema/varicosities:  Not grossly evident Abdominal  Soft,nontender, without masses, guarding or rebound.  Liver/spleen:  No organomegaly noted  Hernia:  None appreciated  Skin  Inspection:  Grossly normal Breasts: Examined lying and sitting.   Right: Without masses, retractions, nipple discharge or axillary adenopathy.   Left: Without masses, retractions, nipple discharge or axillary adenopathy. Genitourinary   Inguinal/mons:  Normal without inguinal adenopathy  External genitalia:  Normal appearing vulva with no masses, tenderness, or lesions  BUS/Urethra/Skene's glands:  Normal  Vagina:  Normal appearing with normal color and discharge, no lesions  Cervix:  Normal appearing without discharge or lesions. IUD  string visible.   Uterus:  Normal in size, shape and contour.  Midline and mobile, nontender  Adnexa/parametria:     Rt: Normal in size, without masses or tenderness.   Lt: Normal in size, without masses or tenderness.  Anus and perineum: Normal  Patient informed chaperone available to be present for breast and pelvic exam. Patient has requested no chaperone to be present. Patient has been advised what will be completed during breast and pelvic exam.   Assessment/Plan:  40 y.o. G0 for annual exam.   Well female exam with routine gynecological exam - Plan: Cytology - PAP( [place]). Education provided on SBEs, importance of preventative screenings, current guidelines, high calcium diet, regular exercise, and multivitamin daily.  Labs with PCP.  Encounter for routine checking of intrauterine contraceptive device (IUD) - Mirena IUD inserted 02/2017. Amenorrheic. She is aware of 8-year FDA approval for pregnancy prevention.   HSV (herpes simplex virus) anogenital infection - Plan: valACYclovir (VALTREX) 1000 MG tablet daily x 5 days at first sign of outbreak. No outbreak since initial 11/2020.   Screening for cervical cancer - 2010 LEEP CIN 3, paps normal since. Will follow 3-year interval per guidelines. Pap today.   Return in 1 year for annual.   Tamela Gammon DNP, 12:07 PM 04/01/2021

## 2021-04-03 LAB — CYTOLOGY - PAP: Diagnosis: NEGATIVE

## 2021-04-04 ENCOUNTER — Ambulatory Visit: Payer: 59 | Admitting: Obstetrics & Gynecology

## 2021-04-04 ENCOUNTER — Other Ambulatory Visit: Payer: Self-pay

## 2021-04-04 ENCOUNTER — Other Ambulatory Visit (HOSPITAL_BASED_OUTPATIENT_CLINIC_OR_DEPARTMENT_OTHER): Payer: Self-pay | Admitting: Otolaryngology

## 2021-04-04 ENCOUNTER — Ambulatory Visit (HOSPITAL_BASED_OUTPATIENT_CLINIC_OR_DEPARTMENT_OTHER)
Admission: RE | Admit: 2021-04-04 | Discharge: 2021-04-04 | Disposition: A | Discharge status: Home / Self Care | Payer: 59 | Source: Ambulatory Visit | Attending: Otolaryngology | Admitting: Otolaryngology

## 2021-04-04 DIAGNOSIS — R221 Localized swelling, mass and lump, neck: Secondary | ICD-10-CM | POA: Diagnosis not present

## 2021-04-23 DIAGNOSIS — F411 Generalized anxiety disorder: Secondary | ICD-10-CM | POA: Diagnosis not present

## 2021-04-23 DIAGNOSIS — F331 Major depressive disorder, recurrent, moderate: Secondary | ICD-10-CM | POA: Diagnosis not present

## 2021-04-23 DIAGNOSIS — F4312 Post-traumatic stress disorder, chronic: Secondary | ICD-10-CM | POA: Diagnosis not present

## 2021-05-03 DIAGNOSIS — F4312 Post-traumatic stress disorder, chronic: Secondary | ICD-10-CM | POA: Diagnosis not present

## 2021-05-03 DIAGNOSIS — F411 Generalized anxiety disorder: Secondary | ICD-10-CM | POA: Diagnosis not present

## 2021-05-03 DIAGNOSIS — F331 Major depressive disorder, recurrent, moderate: Secondary | ICD-10-CM | POA: Diagnosis not present

## 2021-05-12 DIAGNOSIS — Z20828 Contact with and (suspected) exposure to other viral communicable diseases: Secondary | ICD-10-CM | POA: Diagnosis not present

## 2021-05-12 DIAGNOSIS — J069 Acute upper respiratory infection, unspecified: Secondary | ICD-10-CM | POA: Diagnosis not present

## 2021-05-12 DIAGNOSIS — H6120 Impacted cerumen, unspecified ear: Secondary | ICD-10-CM | POA: Diagnosis not present

## 2021-05-21 DIAGNOSIS — J029 Acute pharyngitis, unspecified: Secondary | ICD-10-CM | POA: Diagnosis not present

## 2021-05-23 DIAGNOSIS — F411 Generalized anxiety disorder: Secondary | ICD-10-CM | POA: Diagnosis not present

## 2021-05-23 DIAGNOSIS — F4312 Post-traumatic stress disorder, chronic: Secondary | ICD-10-CM | POA: Diagnosis not present

## 2021-05-23 DIAGNOSIS — F331 Major depressive disorder, recurrent, moderate: Secondary | ICD-10-CM | POA: Diagnosis not present

## 2021-05-27 ENCOUNTER — Telehealth: Payer: Self-pay | Admitting: Internal Medicine

## 2021-05-27 NOTE — Telephone Encounter (Signed)
Spoke w/ Pt- informed of recommendations. Pt verbalized understanding.  

## 2021-05-27 NOTE — Telephone Encounter (Signed)
Pt was diagnosed with strep C last week and is currently on day 4/5 with meds. Meds are not working and pt would like advice.   Please advise.

## 2021-05-27 NOTE — Telephone Encounter (Signed)
Please advise 

## 2021-05-27 NOTE — Telephone Encounter (Signed)
1.  Strep C typically respond to the same antibiotic as a regular strep.  With her multiple antibiotic allergies/intolerances I do not know if she can take something different. 2.  Recommend to reach out to the provider that diagnosed her and treated her with with strep C 3.  Otherwise office visit recommended (with records from the other office)

## 2021-05-28 ENCOUNTER — Ambulatory Visit: Payer: Self-pay | Admitting: Family

## 2021-05-29 ENCOUNTER — Ambulatory Visit: Payer: Self-pay | Admitting: Family Medicine

## 2021-05-30 ENCOUNTER — Ambulatory Visit: Payer: Self-pay | Admitting: Internal Medicine

## 2021-06-04 ENCOUNTER — Encounter: Payer: Self-pay | Admitting: Family

## 2021-06-04 ENCOUNTER — Ambulatory Visit: Payer: BC Managed Care – PPO | Admitting: Family

## 2021-06-04 VITALS — BP 138/60 | HR 116 | Temp 98.6°F | Ht 64.0 in | Wt 193.4 lb

## 2021-06-04 DIAGNOSIS — R0982 Postnasal drip: Secondary | ICD-10-CM | POA: Diagnosis not present

## 2021-06-04 DIAGNOSIS — J312 Chronic pharyngitis: Secondary | ICD-10-CM | POA: Diagnosis not present

## 2021-06-04 DIAGNOSIS — J029 Acute pharyngitis, unspecified: Secondary | ICD-10-CM

## 2021-06-04 MED ORDER — CLINDAMYCIN HCL 300 MG PO CAPS: 300.0000 mg | ORAL_CAPSULE | Freq: Three times a day (TID) | ORAL | 0 refills | Status: AC

## 2021-06-04 NOTE — Progress Notes (Signed)
[name] is a 41 y.o. female with the following history as recorded in EpicCare:  Patient Active Problem List   Diagnosis Date Noted   PCP notes >>>>>> 03/26/2021   Hydradenitis 03/26/2021   Herpes simplex type 1 infection 03/25/2021   ADHD 03/19/2021   Recurrent boils 02/04/2018   History of kidney stones 12/17/2017   Labial cyst 09/04/2017   Observed sleep apnea 09/04/2017   Depression     Current Outpatient Medications  Medication Sig Dispense Refill   amphetamine-dextroamphetamine (ADDERALL) 20 MG tablet Take 20 mg by mouth 3 (three) times daily.      clonazePAM (KLONOPIN) 0.5 MG tablet Take 0.5 mg by mouth at bedtime as needed for anxiety.     levonorgestrel (MIRENA) 20 MCG/24HR IUD 1 each by Intrauterine route once.     clindamycin (CLEOCIN) 300 MG capsule Take 1 capsule (300 mg total) by mouth 3 (three) times daily for 10 days. 30 capsule 0   No current facility-administered medications for this visit.    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   Kidney stones    Migraines    Sleep apnea     Past Surgical History:  Procedure Laterality Date   ANTERIOR CRUCIATE LIGAMENT REPAIR  1998   LEFT   CERVICAL BIOPSY  W/ LOOP ELECTRODE EXCISION  11/2008   CIN 3 free margins   COLPOSCOPY     Cyst removed from groin     INTRAUTERINE DEVICE INSERTION  03/09/2017   Mirena   KNEE ARTHROCENTESIS  2006   MOUTH SURGERY     PILONIDAL CYST EXCISION  1999    Family History  Problem  Relation Age of Onset   Lupus Mother    Cancer Father        Throid cancer   Diabetes Father    Hypertension Father    Hyperlipidemia Father    Heart disease Paternal Grandfather    Cancer Paternal Grandfather        Prostate    Social History   Tobacco Use   Smoking status: Every Day    Packs/day: 0.50    Types: Cigarettes   Smokeless tobacco: Never   Tobacco comments:    Started smoking ~ 41 y/o  Substance Use Topics   Alcohol use: Yes    Alcohol/week: 0.0 standard drinks    Comment: Rare    Subjective:   Patient has been to U/C twice in the past 3 weeks with 2 episodes of sore throat;  1st visit was diagnosed with viral URI; 2nd visit was prescribed Z-pak; notes that she felt good while on the antibiotic but felt that symptoms started again yesterday;  Concerned about chronic post-nasal drainage; has been under care of ENT and according to notes was due for a follow up there in January 2023- unfortunately has not been able to keep that appointment.      Objective:  Vitals:   06/04/21 1340  BP: 138/60  Pulse: (!) 116  Temp: 98.6 F (37  C)  TempSrc: Oral  SpO2: 95%  Weight: 193 lb 6.4 oz (87.7 kg)  Height: '5\' 4"'  (1.626 m)    General: Well developed, well nourished, in no acute distress  Skin : Warm and dry.  Head: Normocephalic and atraumatic  Eyes: Sclera and conjunctiva clear; pupils round and reactive to light; extraocular movements intact  Ears: External normal; canals clear; tympanic membranes normal  Oropharynx: Pink, supple. No suspicious lesions; erythema/ post-nasal drainage noted;  Neck: Supple without thyromegaly, adenopathy  Lungs: Respirations unlabored;  Neurologic: Alert and oriented; speech intact; face symmetrical; moves all extremities well; CNII-XII intact without focal deficit   Assessment:  1. Sore throat   2. Sore throat, chronic   3. Post-nasal drainage     Plan:  Appears consistent with post-nasal drainage; stressed need to follow  up with her ENT due to frequent infections; Will update CBC, CMP and throat culture; she was diagnosed with Strep C on last throat culture by urgent care. Discussed limited options for treatment based on her allergies but patient notes she can tolerate Clindamycin if she takes probiotic/ yogurt- will try using as opposed to repeat treatment with another Z-pak;   This visit occurred during the SARS-CoV-2 public health emergency.  Safety protocols were in place, including screening questions prior to the visit, additional usage of staff PPE, and extensive cleaning of exam room while observing appropriate contact time as indicated for disinfecting solutions.    No follow-ups on file.  Orders Placed This Encounter  Procedures   Culture, Group A Strep    Order Specific Question:   Source    Answer:   throat   CBC with Differential/Platelet   Comp Met (CMET)    Requested Prescriptions   Signed Prescriptions Disp Refills   clindamycin (CLEOCIN) 300 MG capsule 30 capsule 0    Sig: Take 1 capsule (300 mg total) by mouth 3 (three) times daily for 10 days.

## 2021-06-05 LAB — COMPREHENSIVE METABOLIC PANEL
ALT: 14 U/L (ref 0–35)
AST: 13 U/L (ref 0–37)
Albumin: 4.8 g/dL (ref 3.5–5.2)
Alkaline Phosphatase: 66 U/L (ref 39–117)
BUN: 15 mg/dL (ref 6–23)
CO2: 34 mEq/L — ABNORMAL HIGH (ref 19–32)
Calcium: 10.1 mg/dL (ref 8.4–10.5)
Chloride: 101 mEq/L (ref 96–112)
Creatinine, Ser: 0.81 mg/dL (ref 0.40–1.20)
GFR: 91.69 mL/min (ref >60.00)
Glucose, Bld: 91 mg/dL (ref 70–99)
Potassium: 4.8 mEq/L (ref 3.5–5.1)
Sodium: 139 mEq/L (ref 135–145)
Total Bilirubin: 0.2 mg/dL (ref 0.2–1.2)
Total Protein: 7.4 g/dL (ref 6.0–8.3)

## 2021-06-05 LAB — CBC WITH DIFFERENTIAL/PLATELET
Basophils Absolute: 0.1 10*3/uL (ref 0.0–0.1)
Basophils Relative: 0.7 % (ref 0.0–3.0)
Eosinophils Absolute: 0.1 10*3/uL (ref 0.0–0.7)
Eosinophils Relative: 0.6 % (ref 0.0–5.0)
HCT: 40.6 % (ref 36.0–46.0)
Hemoglobin: 13.4 g/dL (ref 12.0–15.0)
Lymphocytes Relative: 17.8 % (ref 12.0–46.0)
Lymphs Abs: 2.4 10*3/uL (ref 0.7–4.0)
MCHC: 32.9 g/dL (ref 30.0–36.0)
MCV: 86.7 fl (ref 78.0–100.0)
Monocytes Absolute: 0.8 10*3/uL (ref 0.1–1.0)
Monocytes Relative: 5.7 % (ref 3.0–12.0)
Neutro Abs: 9.9 10*3/uL — ABNORMAL HIGH (ref 1.4–7.7)
Neutrophils Relative %: 75.2 % (ref 43.0–77.0)
Platelets: 518 10*3/uL — ABNORMAL HIGH (ref 150.0–400.0)
RBC: 4.69 Mil/uL (ref 3.87–5.11)
RDW: 14.2 % (ref 11.5–15.5)
WBC: 13.2 10*3/uL — ABNORMAL HIGH (ref 4.0–10.5)

## 2021-06-06 LAB — CULTURE, GROUP A STREP
MICRO NUMBER:: 13037013
SPECIMEN QUALITY:: ADEQUATE

## 2021-06-07 DIAGNOSIS — F411 Generalized anxiety disorder: Secondary | ICD-10-CM | POA: Diagnosis not present

## 2021-06-07 DIAGNOSIS — F4312 Post-traumatic stress disorder, chronic: Secondary | ICD-10-CM | POA: Diagnosis not present

## 2021-06-07 DIAGNOSIS — F331 Major depressive disorder, recurrent, moderate: Secondary | ICD-10-CM | POA: Diagnosis not present

## 2021-06-10 DIAGNOSIS — J0140 Acute pansinusitis, unspecified: Secondary | ICD-10-CM | POA: Diagnosis not present

## 2021-06-10 DIAGNOSIS — A491 Streptococcal infection, unspecified site: Secondary | ICD-10-CM | POA: Diagnosis not present

## 2021-06-13 DIAGNOSIS — F331 Major depressive disorder, recurrent, moderate: Secondary | ICD-10-CM | POA: Diagnosis not present

## 2021-06-13 DIAGNOSIS — F4312 Post-traumatic stress disorder, chronic: Secondary | ICD-10-CM | POA: Diagnosis not present

## 2021-06-13 DIAGNOSIS — F411 Generalized anxiety disorder: Secondary | ICD-10-CM | POA: Diagnosis not present

## 2021-06-20 DIAGNOSIS — F411 Generalized anxiety disorder: Secondary | ICD-10-CM | POA: Diagnosis not present

## 2021-06-20 DIAGNOSIS — F331 Major depressive disorder, recurrent, moderate: Secondary | ICD-10-CM | POA: Diagnosis not present

## 2021-06-20 DIAGNOSIS — F4312 Post-traumatic stress disorder, chronic: Secondary | ICD-10-CM | POA: Diagnosis not present

## 2021-06-21 DIAGNOSIS — A491 Streptococcal infection, unspecified site: Secondary | ICD-10-CM | POA: Diagnosis not present

## 2021-06-21 DIAGNOSIS — J324 Chronic pansinusitis: Secondary | ICD-10-CM | POA: Diagnosis not present

## 2021-06-27 DIAGNOSIS — F331 Major depressive disorder, recurrent, moderate: Secondary | ICD-10-CM | POA: Diagnosis not present

## 2021-06-27 DIAGNOSIS — A491 Streptococcal infection, unspecified site: Secondary | ICD-10-CM | POA: Diagnosis not present

## 2021-06-27 DIAGNOSIS — F411 Generalized anxiety disorder: Secondary | ICD-10-CM | POA: Diagnosis not present

## 2021-06-27 DIAGNOSIS — F4312 Post-traumatic stress disorder, chronic: Secondary | ICD-10-CM | POA: Diagnosis not present

## 2021-06-27 DIAGNOSIS — J0140 Acute pansinusitis, unspecified: Secondary | ICD-10-CM | POA: Diagnosis not present

## 2021-06-28 ENCOUNTER — Encounter: Payer: BC Managed Care – PPO | Admitting: Internal Medicine

## 2021-07-05 DIAGNOSIS — F411 Generalized anxiety disorder: Secondary | ICD-10-CM | POA: Diagnosis not present

## 2021-07-05 DIAGNOSIS — F4312 Post-traumatic stress disorder, chronic: Secondary | ICD-10-CM | POA: Diagnosis not present

## 2021-07-05 DIAGNOSIS — F331 Major depressive disorder, recurrent, moderate: Secondary | ICD-10-CM | POA: Diagnosis not present

## 2021-07-11 DIAGNOSIS — F411 Generalized anxiety disorder: Secondary | ICD-10-CM | POA: Diagnosis not present

## 2021-07-11 DIAGNOSIS — F331 Major depressive disorder, recurrent, moderate: Secondary | ICD-10-CM | POA: Diagnosis not present

## 2021-07-11 DIAGNOSIS — F4312 Post-traumatic stress disorder, chronic: Secondary | ICD-10-CM | POA: Diagnosis not present

## 2021-07-18 DIAGNOSIS — F4312 Post-traumatic stress disorder, chronic: Secondary | ICD-10-CM | POA: Diagnosis not present

## 2021-07-18 DIAGNOSIS — F331 Major depressive disorder, recurrent, moderate: Secondary | ICD-10-CM | POA: Diagnosis not present

## 2021-07-18 DIAGNOSIS — F411 Generalized anxiety disorder: Secondary | ICD-10-CM | POA: Diagnosis not present

## 2021-07-25 DIAGNOSIS — F411 Generalized anxiety disorder: Secondary | ICD-10-CM | POA: Diagnosis not present

## 2021-07-25 DIAGNOSIS — F4312 Post-traumatic stress disorder, chronic: Secondary | ICD-10-CM | POA: Diagnosis not present

## 2021-07-25 DIAGNOSIS — F331 Major depressive disorder, recurrent, moderate: Secondary | ICD-10-CM | POA: Diagnosis not present

## 2021-08-01 DIAGNOSIS — F4312 Post-traumatic stress disorder, chronic: Secondary | ICD-10-CM | POA: Diagnosis not present

## 2021-08-01 DIAGNOSIS — F411 Generalized anxiety disorder: Secondary | ICD-10-CM | POA: Diagnosis not present

## 2021-08-01 DIAGNOSIS — F331 Major depressive disorder, recurrent, moderate: Secondary | ICD-10-CM | POA: Diagnosis not present

## 2021-08-05 ENCOUNTER — Ambulatory Visit (INDEPENDENT_AMBULATORY_CARE_PROVIDER_SITE_OTHER): Payer: BC Managed Care – PPO | Admitting: Internal Medicine

## 2021-08-05 ENCOUNTER — Encounter: Payer: Self-pay | Admitting: Internal Medicine

## 2021-08-05 ENCOUNTER — Telehealth: Payer: Self-pay

## 2021-08-05 VITALS — BP 126/80 | HR 83 | Temp 98.2°F | Resp 16 | Ht 64.0 in | Wt 198.2 lb

## 2021-08-05 DIAGNOSIS — A491 Streptococcal infection, unspecified site: Secondary | ICD-10-CM

## 2021-08-05 DIAGNOSIS — Z0000 Encounter for general adult medical examination without abnormal findings: Secondary | ICD-10-CM | POA: Diagnosis not present

## 2021-08-05 DIAGNOSIS — J020 Streptococcal pharyngitis: Secondary | ICD-10-CM

## 2021-08-05 DIAGNOSIS — D72829 Elevated white blood cell count, unspecified: Secondary | ICD-10-CM | POA: Diagnosis not present

## 2021-08-05 DIAGNOSIS — D75839 Thrombocytosis, unspecified: Secondary | ICD-10-CM

## 2021-08-05 DIAGNOSIS — Z0001 Encounter for general adult medical examination with abnormal findings: Secondary | ICD-10-CM

## 2021-08-05 DIAGNOSIS — E119 Type 2 diabetes mellitus without complications: Secondary | ICD-10-CM | POA: Diagnosis not present

## 2021-08-05 LAB — CBC WITH DIFFERENTIAL/PLATELET
Basophils Absolute: 0.1 10*3/uL (ref 0.0–0.1)
Basophils Relative: 0.4 % (ref 0.0–3.0)
Eosinophils Absolute: 0.1 10*3/uL (ref 0.0–0.7)
Eosinophils Relative: 0.5 % (ref 0.0–5.0)
HCT: 42 % (ref 36.0–46.0)
Hemoglobin: 13.8 g/dL (ref 12.0–15.0)
Lymphocytes Relative: 9.7 % — ABNORMAL LOW (ref 12.0–46.0)
Lymphs Abs: 2 10*3/uL (ref 0.7–4.0)
MCHC: 32.9 g/dL (ref 30.0–36.0)
MCV: 88.1 fl (ref 78.0–100.0)
Monocytes Absolute: 0.9 10*3/uL (ref 0.1–1.0)
Monocytes Relative: 4.5 % (ref 3.0–12.0)
Neutro Abs: 17.4 10*3/uL — ABNORMAL HIGH (ref 1.4–7.7)
Neutrophils Relative %: 84.9 % — ABNORMAL HIGH (ref 43.0–77.0)
Platelets: 493 10*3/uL — ABNORMAL HIGH (ref 150.0–400.0)
RBC: 4.77 Mil/uL (ref 3.87–5.11)
RDW: 13.7 % (ref 11.5–15.5)
WBC: 20.4 10*3/uL (ref 4.0–10.5)

## 2021-08-05 LAB — LIPID PANEL
Cholesterol: 125 mg/dL (ref 0–200)
HDL: 49.3 mg/dL (ref >39.00)
LDL Cholesterol: 55 mg/dL (ref 0–99)
NonHDL: 76.11
Total CHOL/HDL Ratio: 3
Triglycerides: 107 mg/dL (ref 0.0–149.0)
VLDL: 21.4 mg/dL (ref 0.0–40.0)

## 2021-08-05 LAB — HEMOGLOBIN A1C: Hgb A1c MFr Bld: 6.6 % — ABNORMAL HIGH (ref 4.6–6.5)

## 2021-08-05 MED ORDER — AMOXICILLIN-POT CLAVULANATE 875-125 MG PO TABS: 1.0000 | ORAL_TABLET | Freq: Two times a day (BID) | ORAL | 0 refills | Status: DC

## 2021-08-05 NOTE — Progress Notes (Signed)
? ?Subjective:  ? ? Patient ID: [name], female    DOB: 19-Sep-1980, 41 y.o.   MRN: [id number] ? ?DOS:  08/05/2021 ?Type of visit - description: CPX ? ?Here for CPX. ?Also has a long history of sore throat. ?Chart is reviewed. ?She was asymptomatic until yesterday when she developed a fever of 100.0, sore throat, throat swelling. ?Symptoms decreased with ibuprofen. ? ? ?Review of Systems ? ?Other than above, a 14 point review of systems is negative  ? ?  ? ? ?Past Medical History:  ?Diagnosis Date  ? ADHD   ? Allergy   ? Anemia, iron deficiency   ? Anxiety   ? Arthritis   ? CIN III (cervical intraepithelial neoplasia grade III) with severe dysplasia 11/2008  ? LEEP-D&C-HYSTEROSCOPY-SHOWED CIN II AND CIN III  ? Depression   ? DUB (dysfunctional uterine bleeding)   ? Herpes simplex type 1 infection 11/2020  ? Iron deficiency anemia secondary to blood loss (chronic) 07/29/2012  ? Kidney stones   ? Migraines   ? Sleep apnea   ? ? ?Past Surgical History:  ?Procedure Laterality Date  ? [place]  ? LEFT  ? CERVICAL BIOPSY  W/ LOOP ELECTRODE EXCISION  11/2008  ? CIN 3 free margins  ? COLPOSCOPY    ? Cyst removed from groin    ? INTRAUTERINE DEVICE INSERTION  03/09/2017  ? Mirena  ? KNEE ARTHROCENTESIS  2006  ? MOUTH SURGERY    ? PILONIDAL CYST EXCISION  1999  ? ?Social History  ? ?Socioeconomic History  ? Marital status: Single  ?  Spouse name: Not on file  ? Number of children: 0  ? Years of education: Not on file  ? Highest education level: Not on file  ?Occupational History  ? Not on file  ?Tobacco Use  ? Smoking status: Every Day  ?  Packs/day: 0.50  ?  Types: Cigarettes  ? Smokeless tobacco: Never  ? Tobacco comments:  ?  Started smoking ~ 41 y/o  ?Vaping Use  ? Vaping Use: Never used  ?Substance and Sexual Activity  ? Alcohol use: Yes  ?  Alcohol/week: 0.0 standard drinks  ?  Comment: Rare  ? Drug use: No  ? Sexual activity: Yes  ?  Partners: Male  ?  Birth control/protection:  I.U.D.  ?  Comment: 1st intercourse 41 yo-more than 5 partners Mirena 03/09/2017  ?Other Topics Concern  ? Not on file  ?Social History Narrative  ? P2, G0  ? 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 ?Social Determinants of Health  ? ?Financial Resource Strain: Not on file  ?Food Insecurity: Not on file  ?Transportation Needs: Not on file  ?Physical Activity: Not on file  ?Stress: Not on file  ?Social Connections: Not on file  ?Intimate Partner Violence: Not on file  ? ? ?Current Outpatient Medications  ?Medication Instructions  ? amoxicillin-clavulanate (AUGMENTIN) 875-125 MG tablet 1 tablet, Oral, 2 times daily  ? amphetamine-dextroamphetamine (ADDERALL) 20 MG tablet 20 mg, Oral, 3 times daily  ? clonazePAM (KLONOPIN) 0.5 mg, Oral, At bedtime PRN  ? levonorgestrel (MIRENA) 20 MCG/24HR IUD 1 each, Intrauterine,  Once  ? ? ?   ?Objective:  ? Physical Exam ?BP 126/80 (BP Location: Right Arm, Patient Position: Sitting, Cuff Size: Small)   Pulse 83   Temp 98.2 ?F (36.8 ?C) (Oral)   Resp 16   Ht '5\' 4"'$  (1.626 m)  Wt 198 lb 4 oz (89.9 kg)   SpO2 98%   BMI 34.03 kg/m?  ?General: ?Well developed, NAD, BMI noted ?Neck: No  thyromegaly  ?HEENT:  ?Normocephalic . Face symmetric, atraumatic. ?TMs normal ?Throat: + Redness, no white patches, tonsils are small, no ulcers, pharynx walls symmetric. ?No difficulty swallowing noted. ?Neck: No obvious lump or mass, some fullness at the left lateral neck. ?Lungs:  ?CTA B ?Normal respiratory effort, no intercostal retractions, no accessory muscle use. ?Heart: RRR,  no murmur.  ?Abdomen:  ?Not distended, soft, non-tender. No rebound or rigidity.   ?Lower extremities: no pretibial edema bilaterally  ?Skin: Exposed areas without rash. Not pale. Not jaundice ?Neurologic:  ?alert & oriented X3.  ?Speech normal, gait appropriate for age and unassisted ?Strength symmetric and appropriate for age.  ?Psych: ?Cognition and judgment appear intact.  ?Cooperative with  normal attention span and concentration.  ?Behavior appropriate. ?No anxious or depressed appearing. ? ?   ?Assessment   ? ?  ?Assessment (new patient 03/25/2021, transferred from another office) ?DM A1c 6.7  (03-2021) ?Anxiety ?ADD  (see Dr Toy Care) ?OSA: dx ~ 2019, failed a brief trial with a CPAP, not using as off 03-2021 ?Hydradenitis suppurativa ?H/o urolithiasis  ?Birth control: IUD ? ?PLAN ?Here for CPX, multiple other issues d/w pt ?DM: dx d/w pt, labs, further advise w/ results ?L neck pain, chronic sinusitis, strep C infection: ?Saw ENT 03/29/2021 with left-sided neck pain and swelling.  They recommended CT neck with contrast however I only see the report of a neck US that showed mild nodal prominence on the left.  No definite pathologic nodules ?ENT 06/21/2021, they noted strep group C x2, had Zithromax and then clindamycin. ?Subsequently saw ID on 06/27/2021, was prescribed another round of antibiotics and recommended a CT only if not better. ?After Augmentin, all sxs resolved until yesterday when she developed sore throat and fever. ?On exam, she has pharyngitis. ?Plan: Strongly encouraged to reach out to ID and/or ENT for advice. ?Throat culture (on a wound culture stick as suggested by the chart review) ?Augmentin ?ER if severe symptoms ?Chronic thrombocytosis: Noted per chart review, rechecking a CBC today.  Considering hematology referral noting that today she has a throat infection and increased platelet could be an acute reactant. ?RTC 4 months ? ?  ?This visit occurred during the SARS-CoV-2 public health emergency.  Safety protocols were in place, including screening questions prior to the visit, additional usage of staff PPE, and extensive cleaning of exam room while observing appropriate contact time as indicated for disinfecting solutions.  ? ?

## 2021-08-05 NOTE — Assessment & Plan Note (Signed)
Td 2018 ?Female care: h/o CIN III, LEEP ~ 2010, sees gyn q year ?CCS: Not indicated ?Labs: FLP, CBC, A1c, micro ?Diet exercise  d/w pt  ? ? ? ? ?

## 2021-08-05 NOTE — Patient Instructions (Addendum)
Per our records you are due for your diabetic eye exam. Please contact your eye doctor to schedule an appointment. Please have them send copies of your office visit notes to [place]. Our fax number is [phone]. If you need a referral to an eye doctor please let us know. ? ? ?Take antibiotic as prescribed ? ?Continue ibuprofen with food or tylenol  as needed ? ?Reach out to ENT and infectious disease doctors and let me know about your symptoms. ? ?ER if severe sore throat, difficulty swallowing, neck swelling. ? ?GO TO THE LAB : Get the blood work   ? ? ?[name], [name] ?Come back for a checkup in 4 months ?

## 2021-08-05 NOTE — Telephone Encounter (Signed)
CRITICAL VALUE STICKER ? ?CRITICAL VALUE: WBC 20.4 ? ?RECEIVER (on-site recipient of call): Evaline Waltman, RMA ? ?DATE & TIME NOTIFIED: 08/05/2021 at 4:14 pm ? ?MESSENGER (representative from lab): Delorise Jackson  ? ?MD NOTIFIED: Kathlene November, MD  ? ?TIME OF NOTIFICATION:4:22 pm ? ?RESPONSE:   ?

## 2021-08-05 NOTE — Assessment & Plan Note (Signed)
Here for CPX, multiple other issues d/w pt ?DM: dx d/w pt, labs, further advise w/ results ?L neck pain, chronic sinusitis, strep C infection: ?Saw ENT 03/29/2021 with left-sided neck pain and swelling.  They recommended CT neck with contrast however I only see the report of a neck US that showed mild nodal prominence on the left.  No definite pathologic nodules ?ENT 06/21/2021, they noted strep group C x2, had Zithromax and then clindamycin. ?Subsequently saw ID on 06/27/2021, was prescribed another round of antibiotics and recommended a CT only if not better. ?After Augmentin, all sxs resolved until yesterday when she developed sore throat and fever. ?On exam, she has pharyngitis. ?Plan: Strongly encouraged to reach out to ID and/or ENT for advice. ?Throat culture (on a wound culture stick as suggested by the chart review) ?Augmentin ?ER if severe symptoms ?Chronic thrombocytosis: Noted per chart review, rechecking a CBC today.  Considering hematology referral noting that today she has a throat infection and increased platelet could be an acute reactant. ?RTC 4 months ?

## 2021-08-05 NOTE — Telephone Encounter (Signed)
Noted, pending full report. ?

## 2021-08-06 ENCOUNTER — Encounter: Payer: Self-pay | Admitting: Internal Medicine

## 2021-08-06 ENCOUNTER — Telehealth: Payer: Self-pay | Admitting: Internal Medicine

## 2021-08-06 LAB — MICROALBUMIN / CREATININE URINE RATIO
Creatinine,U: 191.7 mg/dL
Microalb Creat Ratio: 2.1 mg/g (ref 0.0–30.0)
Microalb, Ur: 4.1 mg/dL — ABNORMAL HIGH (ref 0.0–1.9)

## 2021-08-06 NOTE — Addendum Note (Signed)
Addended byDamita Dunnings D on: 08/06/2021 05:03 PM ? ? Modules accepted: Orders ? ?

## 2021-08-06 NOTE — Telephone Encounter (Addendum)
Spoke with the patient. ?She called about the throat culture which is pending. ?Her WBCs  is very high, she continue with sore throat. Redness of the throat seems worse to her. ?Denies any difficulty swallowing. ?Over the phone, she is sounded well, voice wnl. ?Again asked her to keep her ENT and ID doctors informed. ?(Also reports she has a skin abscess at the thigh which may be contributing to the elevated white count) ?Plan: ?Scheduled CT neck with and without, she requested to be at [facility] ?

## 2021-08-07 ENCOUNTER — Encounter: Payer: Self-pay | Admitting: Internal Medicine

## 2021-08-07 ENCOUNTER — Telehealth: Payer: Self-pay | Admitting: Internal Medicine

## 2021-08-07 NOTE — Telephone Encounter (Signed)
Patient sent me a picture of her throat, at this point the throat is asymmetric, right side seems larger. ?With a recent white count of 20 K a abscess must be ruled out. ?Called  patient: Advised her to go to the ER now to rule out an abscess.  She verbalized understanding. ?

## 2021-08-08 ENCOUNTER — Other Ambulatory Visit: Payer: Self-pay

## 2021-08-08 ENCOUNTER — Telehealth: Payer: Self-pay | Admitting: Internal Medicine

## 2021-08-08 ENCOUNTER — Encounter: Payer: Self-pay | Admitting: Internal Medicine

## 2021-08-08 ENCOUNTER — Ambulatory Visit (HOSPITAL_BASED_OUTPATIENT_CLINIC_OR_DEPARTMENT_OTHER): Admission: RE | Admit: 2021-08-08 | Payer: BC Managed Care – PPO | Source: Ambulatory Visit

## 2021-08-08 ENCOUNTER — Encounter (HOSPITAL_BASED_OUTPATIENT_CLINIC_OR_DEPARTMENT_OTHER): Payer: Self-pay

## 2021-08-08 DIAGNOSIS — A491 Streptococcal infection, unspecified site: Secondary | ICD-10-CM

## 2021-08-08 NOTE — Telephone Encounter (Signed)
I spoke with the patient b/c she sent me a message today asking for a CT of the sinuses. ?Yesterday I advised her to go to the ER, she states called ENT, according to the patient she was told it was okay to wait till today to get a CT however the CT was canceled by the radiology department because they needed BMP. ?This is a difficult situation for me, she could get very ill while under my care. ?Advised patient ?I still think she needs to go to emergency room ?Otherwise recommend to follow with ENT ? ?

## 2021-08-10 LAB — WOUND CULTURE
MICRO NUMBER:: 13302132
SPECIMEN QUALITY:: ADEQUATE

## 2021-08-13 ENCOUNTER — Telehealth: Payer: Self-pay | Admitting: Internal Medicine

## 2021-08-13 DIAGNOSIS — I6522 Occlusion and stenosis of left carotid artery: Secondary | ICD-10-CM | POA: Diagnosis not present

## 2021-08-13 DIAGNOSIS — J020 Streptococcal pharyngitis: Secondary | ICD-10-CM | POA: Diagnosis not present

## 2021-08-13 DIAGNOSIS — R590 Localized enlarged lymph nodes: Secondary | ICD-10-CM | POA: Diagnosis not present

## 2021-08-13 DIAGNOSIS — D72829 Elevated white blood cell count, unspecified: Secondary | ICD-10-CM | POA: Diagnosis not present

## 2021-08-13 NOTE — Telephone Encounter (Signed)
CT sent for scanning. Mychart message sent to Pt with recommendations. US carotid ordered.  ?

## 2021-08-13 NOTE — Telephone Encounter (Signed)
I received a fax from [facility]. ?Had a CT soft tissue neck with IV contrast on 08/13/2021: ?No abscess in the throat. ?Atherosclerosis of the L common carotid artery. ?Plan: ?Advise patient, finish antibiotics as prescribed, must see ENT and infectious diseases to figure out why she has recurrent / serious  throat infections. ?Arrange a nonurgent carotid ultrasound DX carotid artery disease ?Next follow-up should be in about 2 months in person with me ?

## 2021-08-16 DIAGNOSIS — F4312 Post-traumatic stress disorder, chronic: Secondary | ICD-10-CM | POA: Diagnosis not present

## 2021-08-16 DIAGNOSIS — F331 Major depressive disorder, recurrent, moderate: Secondary | ICD-10-CM | POA: Diagnosis not present

## 2021-08-16 DIAGNOSIS — Z720 Tobacco use: Secondary | ICD-10-CM | POA: Diagnosis not present

## 2021-08-16 DIAGNOSIS — J020 Streptococcal pharyngitis: Secondary | ICD-10-CM | POA: Diagnosis not present

## 2021-08-16 DIAGNOSIS — F411 Generalized anxiety disorder: Secondary | ICD-10-CM | POA: Diagnosis not present

## 2021-09-05 DIAGNOSIS — L732 Hidradenitis suppurativa: Secondary | ICD-10-CM | POA: Diagnosis not present

## 2021-09-06 DIAGNOSIS — F331 Major depressive disorder, recurrent, moderate: Secondary | ICD-10-CM | POA: Diagnosis not present

## 2021-09-06 DIAGNOSIS — F4312 Post-traumatic stress disorder, chronic: Secondary | ICD-10-CM | POA: Diagnosis not present

## 2021-09-06 DIAGNOSIS — F411 Generalized anxiety disorder: Secondary | ICD-10-CM | POA: Diagnosis not present

## 2021-09-18 ENCOUNTER — Encounter: Payer: Self-pay | Admitting: Internal Medicine

## 2021-09-25 DIAGNOSIS — F411 Generalized anxiety disorder: Secondary | ICD-10-CM | POA: Diagnosis not present

## 2021-09-25 DIAGNOSIS — F331 Major depressive disorder, recurrent, moderate: Secondary | ICD-10-CM | POA: Diagnosis not present

## 2021-09-25 DIAGNOSIS — F4312 Post-traumatic stress disorder, chronic: Secondary | ICD-10-CM | POA: Diagnosis not present

## 2021-09-26 DIAGNOSIS — F900 Attention-deficit hyperactivity disorder, predominantly inattentive type: Secondary | ICD-10-CM | POA: Diagnosis not present

## 2021-09-26 DIAGNOSIS — F411 Generalized anxiety disorder: Secondary | ICD-10-CM | POA: Diagnosis not present

## 2021-09-30 ENCOUNTER — Encounter: Payer: Self-pay | Admitting: Internal Medicine

## 2021-10-01 NOTE — Addendum Note (Signed)
Addended byDamita Dunnings D on: 10/01/2021 09:37 AM   Modules accepted: Orders

## 2021-10-03 ENCOUNTER — Telehealth: Payer: Self-pay

## 2021-10-03 NOTE — Telephone Encounter (Signed)
Received call from [facility] radiology- Pt is scheduled for carotid ultrasound- imaging called to say they need PA for this (newly required per plan). Pt is scheduled for 10/08/21. Call back: [phone]

## 2021-10-07 DIAGNOSIS — I6522 Occlusion and stenosis of left carotid artery: Secondary | ICD-10-CM | POA: Diagnosis not present

## 2021-10-08 ENCOUNTER — Telehealth: Payer: Self-pay

## 2021-10-08 NOTE — Telephone Encounter (Signed)
Results sent for scanning. Mychart message sent to Pt.

## 2021-10-11 DIAGNOSIS — F411 Generalized anxiety disorder: Secondary | ICD-10-CM | POA: Diagnosis not present

## 2021-10-11 DIAGNOSIS — F331 Major depressive disorder, recurrent, moderate: Secondary | ICD-10-CM | POA: Diagnosis not present

## 2021-10-11 DIAGNOSIS — F4312 Post-traumatic stress disorder, chronic: Secondary | ICD-10-CM | POA: Diagnosis not present

## 2021-10-15 DIAGNOSIS — J020 Streptococcal pharyngitis: Secondary | ICD-10-CM | POA: Diagnosis not present

## 2021-10-25 DIAGNOSIS — F4312 Post-traumatic stress disorder, chronic: Secondary | ICD-10-CM | POA: Diagnosis not present

## 2021-10-25 DIAGNOSIS — F331 Major depressive disorder, recurrent, moderate: Secondary | ICD-10-CM | POA: Diagnosis not present

## 2021-10-25 DIAGNOSIS — F411 Generalized anxiety disorder: Secondary | ICD-10-CM | POA: Diagnosis not present

## 2021-10-30 ENCOUNTER — Ambulatory Visit: Payer: BC Managed Care – PPO

## 2021-10-30 ENCOUNTER — Ambulatory Visit
Admission: EM | Admit: 2021-10-30 | Discharge: 2021-10-30 | Disposition: A | Discharge status: Home / Self Care | Payer: BC Managed Care – PPO

## 2021-10-30 DIAGNOSIS — M79672 Pain in left foot: Secondary | ICD-10-CM | POA: Diagnosis not present

## 2021-10-30 DIAGNOSIS — Z5329 Procedure and treatment not carried out because of patient's decision for other reasons: Secondary | ICD-10-CM

## 2021-10-30 DIAGNOSIS — S9782XA Crushing injury of left foot, initial encounter: Secondary | ICD-10-CM

## 2021-10-30 NOTE — ED Provider Notes (Signed)
Patient presents to urgent care after dropping an approximately 15 to 20 pound glass door on top of her left foot.  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  Per veterinarian read, they did not believe any bones were broken.  Patient states that time, she did not have as much swelling as she does have right now.  Patient also reports a laceration on top of her foot which is healing well.  Patient states that yesterday she noticed some purpleish bruising of her second and third toes on the medial aspect of her heel about the sole of her foot.  Patient reports continued pain with palpation, particularly at the head of the third metatarsal but demonstrates full range of motion of her foot.  Patient states she has been walking and working as normal, has not required any assistive device for ambulation.  Patient endorses some pain with walking but is tolerable.  Patient states she cannot afford x-rays at this time.  Patient advised that I believe she does need x-rays.  Patient asks if it was broken what we would do and I advised her that she would likely need to see an orthopedic.  Patient verbalized interest in seeing orthopedic instead of being here at urgent care.  Patient was advised that there is a walk-in clinic at [facility] that she can go to, address provided.  Patient agreed to go there now.  No services were provided to her during this visit today.   Lynden Oxford Scales, PA-C 10/30/21 1553

## 2021-10-30 NOTE — ED Triage Notes (Addendum)
Pt c/o left foot injury (dropped a glass door on the top of the left foot). She does have swelling, bruising, and laceration to the foot. The patient denies pain until there is pressure applied to the area.  Started/happened: Saturday Home interventions: motrin

## 2021-11-04 DIAGNOSIS — F331 Major depressive disorder, recurrent, moderate: Secondary | ICD-10-CM | POA: Diagnosis not present

## 2021-11-04 DIAGNOSIS — F411 Generalized anxiety disorder: Secondary | ICD-10-CM | POA: Diagnosis not present

## 2021-11-04 DIAGNOSIS — F4312 Post-traumatic stress disorder, chronic: Secondary | ICD-10-CM | POA: Diagnosis not present

## 2021-11-11 DIAGNOSIS — J029 Acute pharyngitis, unspecified: Secondary | ICD-10-CM | POA: Diagnosis not present

## 2021-11-13 DIAGNOSIS — F411 Generalized anxiety disorder: Secondary | ICD-10-CM | POA: Diagnosis not present

## 2021-11-13 DIAGNOSIS — F331 Major depressive disorder, recurrent, moderate: Secondary | ICD-10-CM | POA: Diagnosis not present

## 2021-11-13 DIAGNOSIS — F4312 Post-traumatic stress disorder, chronic: Secondary | ICD-10-CM | POA: Diagnosis not present

## 2021-11-15 ENCOUNTER — Ambulatory Visit: Payer: BC Managed Care – PPO | Admitting: Radiology

## 2021-11-15 VITALS — BP 140/92

## 2021-11-15 DIAGNOSIS — R350 Frequency of micturition: Secondary | ICD-10-CM | POA: Diagnosis not present

## 2021-11-15 DIAGNOSIS — N939 Abnormal uterine and vaginal bleeding, unspecified: Secondary | ICD-10-CM | POA: Diagnosis not present

## 2021-11-15 DIAGNOSIS — B9689 Other specified bacterial agents as the cause of diseases classified elsewhere: Secondary | ICD-10-CM

## 2021-11-15 DIAGNOSIS — B009 Herpesviral infection, unspecified: Secondary | ICD-10-CM | POA: Diagnosis not present

## 2021-11-15 DIAGNOSIS — N949 Unspecified condition associated with female genital organs and menstrual cycle: Secondary | ICD-10-CM

## 2021-11-15 DIAGNOSIS — N760 Acute vaginitis: Secondary | ICD-10-CM

## 2021-11-15 LAB — WET PREP FOR TRICH, YEAST, CLUE

## 2021-11-15 MED ORDER — FLUCONAZOLE 150 MG PO TABS: 150.0000 mg | ORAL_TABLET | ORAL | 0 refills | Status: DC

## 2021-11-15 MED ORDER — METRONIDAZOLE 0.75 % VA GEL: 1.0000 | Freq: Every day | VAGINAL | 0 refills | Status: AC

## 2021-11-15 NOTE — Progress Notes (Signed)
      Subjective: [name] is a 41 y.o. female who complains of Complains of urinary frequency, urgency, no dysuria, bladder spasms (started yesterday). Also with vaginal burning, slight discharge, (took Diflucan Patient started Augmentin '875mg'$  bid on 11/11/21 --x'd for strep at urgent care.  Also had 2nd episode of genital herpes outbreak 2 weeks ago.  Started period 11/12/21 (only has period once a year with IUD).  Increased fatigue.     Review of Systems  All other systems reviewed and are negative.   Today's Vitals   11/15/21 1523  BP: (!) 140/92   There is no height or weight on file to calculate BMI.  Objective:  -Vulva: without lesions or discharge -Vagina: discharge present, aptima swab and wet prep obtained -Cervix: no lesion or discharge, no CMT, IUD strings seen -Perineum: no lesions -Uterus: Mobile, non tender -Adnexa: no masses or tenderness  Urine dipstick shows positive for RBC's and positive for ketones.  Micro exam: 0-5 WBC's per HPF, 10-20 RBC's per HPF, and few+ bacteria.  Microscopic wet-mount exam shows clue cells  Chaperone offered and declined.  Assessment:/Plan:   1. Urinary frequency Push fluids AZO prn - Urinalysis,Complete w/RFL Culture  2. Abnormal vaginal bleeding Reassured irregular bleeding may be normal with IUD - Pregnancy, urine   3. Vaginal burning +BV on wet prep, elects for metrogel, would like diflucan as well in case - WET PREP FOR [place], YEAST, [place] CT/NG/T. vaginalis   Reassured no signs of HSV outbreak  Will contact patient with results of testing completed today. Avoid intercourse until symptoms are resolved. Safe sex encouraged. Avoid the use of soaps or perfumed products in the peri area. Avoid tub baths and sitting in sweaty or wet clothing for prolonged periods of time.

## 2021-11-16 LAB — SURESWAB CT/NG/T. VAGINALIS
C. trachomatis RNA, TMA: NOT DETECTED
N. gonorrhoeae RNA, TMA: NOT DETECTED
Trichomonas vaginalis RNA: NOT DETECTED

## 2021-11-18 LAB — URINALYSIS, COMPLETE W/RFL CULTURE
Bilirubin Urine: NEGATIVE
Glucose, UA: NEGATIVE
Hyaline Cast: NONE SEEN /LPF
Leukocyte Esterase: NEGATIVE
Nitrites, Initial: NEGATIVE
Protein, ur: NEGATIVE
Specific Gravity, Urine: 1.015 (ref 1.001–1.035)
pH: 6.5 (ref 5.0–8.0)

## 2021-11-18 LAB — URINE CULTURE
MICRO NUMBER:: 13737293
Result:: NO GROWTH
SPECIMEN QUALITY:: ADEQUATE

## 2021-11-18 LAB — PREGNANCY, URINE: Preg Test, Ur: NEGATIVE

## 2021-11-18 LAB — CULTURE INDICATED

## 2021-11-25 ENCOUNTER — Encounter: Payer: Self-pay | Admitting: Internal Medicine

## 2021-11-25 DIAGNOSIS — J029 Acute pharyngitis, unspecified: Secondary | ICD-10-CM | POA: Diagnosis not present

## 2021-12-09 ENCOUNTER — Encounter: Payer: Self-pay | Admitting: Internal Medicine

## 2021-12-12 DIAGNOSIS — F4312 Post-traumatic stress disorder, chronic: Secondary | ICD-10-CM | POA: Diagnosis not present

## 2021-12-12 DIAGNOSIS — F331 Major depressive disorder, recurrent, moderate: Secondary | ICD-10-CM | POA: Diagnosis not present

## 2021-12-12 DIAGNOSIS — F411 Generalized anxiety disorder: Secondary | ICD-10-CM | POA: Diagnosis not present

## 2021-12-18 ENCOUNTER — Ambulatory Visit: Payer: BC Managed Care – PPO | Admitting: Radiology

## 2021-12-18 VITALS — BP 128/84

## 2021-12-18 DIAGNOSIS — N898 Other specified noninflammatory disorders of vagina: Secondary | ICD-10-CM

## 2021-12-18 DIAGNOSIS — N3289 Other specified disorders of bladder: Secondary | ICD-10-CM | POA: Diagnosis not present

## 2021-12-18 LAB — WET PREP FOR TRICH, YEAST, CLUE

## 2021-12-18 MED ORDER — METRONIDAZOLE 0.75 % VA GEL: 1.0000 | Freq: Every day | VAGINAL | 0 refills | Status: AC

## 2021-12-18 NOTE — Progress Notes (Signed)
      Subjective: [name] is a 41 y.o. female who complains of  vaginal odor, slight vaginal discharge, urethral spasms while voiding, no dysuria, no frequency.  Symptoms began yesterday. Reports being in a wet bathing suit all weekend.   Review of Systems  All other systems reviewed and are negative.   Past Medical History:  Diagnosis Date   ADHD    Allergy    Anemia, iron deficiency    Anxiety    Arthritis    CIN III (cervical intraepithelial neoplasia grade III) with severe dysplasia 11/2008   LEEP-D&C-HYSTEROSCOPY-SHOWED CIN II AND CIN III   Depression    DUB (dysfunctional uterine bleeding)    Herpes simplex type 1 infection 11/2020   Iron deficiency anemia secondary to blood loss (chronic) 07/29/2012   Kidney stones    Migraines    Sleep apnea    Type 2 diabetes mellitus ([place])       Objective:  Today's Vitals   12/18/21 1541  BP: 128/84   There is no height or weight on file to calculate BMI.   -General: no acute distress -Vulva: without lesions or discharge -Vagina: discharge present, aptima swab and wet prep obtained -Cervix: no lesion or discharge, no CMT -Perineum: no lesions -Uterus: Mobile, non tender -Adnexa: no masses or tenderness  Urine dipstick shows positive for RBC's, positive for leukocytes, and positive for ketones.  Micro exam: 0-5 WBC's per HPF, 3-10 RBC's per HPF, and few+ bacteria.  Microscopic wet-mount exam shows clue cells.   Chaperone offered and declined.  Assessment:/Plan:   1. Vaginal odor +BV, rx for metrogel sent - WET PREP FOR TRICH, YEAST, CLUE  2. Bladder spasms  - Urinalysis,Complete w/RFL Culture    Will contact patient with results of testing completed today. Avoid intercourse until symptoms are resolved. Safe sex encouraged. Avoid the use of soaps or perfumed products in the peri area. Avoid tub baths and sitting in sweaty or wet clothing for prolonged periods of time.

## 2021-12-19 DIAGNOSIS — F4312 Post-traumatic stress disorder, chronic: Secondary | ICD-10-CM | POA: Diagnosis not present

## 2021-12-19 DIAGNOSIS — F331 Major depressive disorder, recurrent, moderate: Secondary | ICD-10-CM | POA: Diagnosis not present

## 2021-12-19 DIAGNOSIS — F411 Generalized anxiety disorder: Secondary | ICD-10-CM | POA: Diagnosis not present

## 2021-12-20 LAB — URINALYSIS, COMPLETE W/RFL CULTURE
Bilirubin Urine: NEGATIVE
Glucose, UA: NEGATIVE
Hyaline Cast: NONE SEEN /LPF
Nitrites, Initial: NEGATIVE
Protein, ur: NEGATIVE
Specific Gravity, Urine: 1.02 (ref 1.001–1.035)
pH: 7 (ref 5.0–8.0)

## 2021-12-20 LAB — URINE CULTURE
MICRO NUMBER:: 13879048
SPECIMEN QUALITY:: ADEQUATE

## 2021-12-20 LAB — CULTURE INDICATED

## 2022-01-02 DIAGNOSIS — F411 Generalized anxiety disorder: Secondary | ICD-10-CM | POA: Diagnosis not present

## 2022-01-02 DIAGNOSIS — F331 Major depressive disorder, recurrent, moderate: Secondary | ICD-10-CM | POA: Diagnosis not present

## 2022-01-09 ENCOUNTER — Ambulatory Visit: Payer: BC Managed Care – PPO | Admitting: Obstetrics and Gynecology

## 2022-01-09 ENCOUNTER — Encounter: Payer: Self-pay | Admitting: Obstetrics and Gynecology

## 2022-01-09 ENCOUNTER — Telehealth: Payer: Self-pay

## 2022-01-09 VITALS — BP 118/82 | HR 77 | Ht 64.0 in | Wt 193.0 lb

## 2022-01-09 DIAGNOSIS — R3129 Other microscopic hematuria: Secondary | ICD-10-CM

## 2022-01-09 DIAGNOSIS — R3915 Urgency of urination: Secondary | ICD-10-CM | POA: Diagnosis not present

## 2022-01-09 DIAGNOSIS — R102 Pelvic and perineal pain: Secondary | ICD-10-CM | POA: Diagnosis not present

## 2022-01-09 DIAGNOSIS — B3731 Acute candidiasis of vulva and vagina: Secondary | ICD-10-CM | POA: Diagnosis not present

## 2022-01-09 DIAGNOSIS — R3989 Other symptoms and signs involving the genitourinary system: Secondary | ICD-10-CM | POA: Diagnosis not present

## 2022-01-09 LAB — URINALYSIS, COMPLETE
Bacteria, UA: NONE SEEN /HPF
Bilirubin Urine: NEGATIVE
Casts: NONE SEEN /LPF
Crystals: NONE SEEN /HPF
Glucose, UA: NEGATIVE
Hyaline Cast: NONE SEEN /LPF
Ketones, ur: NEGATIVE
Leukocytes,Ua: NEGATIVE
Nitrite: NEGATIVE
Protein, ur: NEGATIVE
Specific Gravity, Urine: 1.02 (ref 1.001–1.035)
Yeast: NONE SEEN /HPF
pH: 6.5 (ref 5.0–8.0)

## 2022-01-09 LAB — WET PREP FOR TRICH, YEAST, CLUE

## 2022-01-09 MED ORDER — FLUCONAZOLE 150 MG PO TABS: 150.0000 mg | ORAL_TABLET | Freq: Once | ORAL | 0 refills | Status: AC

## 2022-01-09 NOTE — Patient Instructions (Signed)

## 2022-01-09 NOTE — Telephone Encounter (Signed)
Referral order placed by Dr. Talbert Nan, will fax complete referral once we receive lab results.

## 2022-01-09 NOTE — Telephone Encounter (Signed)
-----   Message from Salvadore Dom, MD sent at 01/09/2022  8:46 AM EDT ----- Please see Urology referral to Dr Claudia Desanctis.

## 2022-01-09 NOTE — Progress Notes (Signed)
GYNECOLOGY  VISIT   HPI: 41 y.o.   Single White or Caucasian Hispanic or Latino  female   G0P0 with No LMP recorded. (Menstrual status: IUD).   here for urethra pain. She states that she used some left over metro gel for 5 nights because she thought that she had BV again. She states that she is having urgency and pain at the opening of her her urethra.    She feels like she is having spasms in her urethra mostly when her bladder is full. This has been going on for the last week, improved with metrogel, but it's coming back again.  She has had this same sensation in the past when she was diagnosed with BV and got better with metrogel.  No pain with urination, some urgency to void when she is having the urethral pain with resultant increased frequency of urination.  The spasm is a pulsating, aching pain in the area of her urethra, near the opening. No dyspareunia. No abdominal pain. No increase in vaginal d/c currently, no odor, seems dryer than normal. No irritation. Just the urethral/vaginal pain. No vaginal bleeding, has an IUD and doesn't have cycles.   She was diagnosed with BV on 12/18/21 and 11/15/21.  Negative cervical cultures on 11/15/21, same partner. No STD concerns.  She had blood in her urine the last 2 x her urine was checked (12/18/21 and 11/15/21, wasn't bleeding)  H/O kidney stone.  GYNECOLOGIC HISTORY: No LMP recorded. (Menstrual status: IUD). Contraception:IUD Menopausal hormone therapy: none         OB History     Gravida  0   Para      Term      Preterm      AB      Living         SAB      IAB      Ectopic      Multiple      Live Births                 Patient Active Problem List   Diagnosis Date Noted   Diabetes mellitus without complication ([place]) 38/25/0539   Annual physical exam 08/05/2021   PCP notes >>>>>> 03/26/2021   Hydradenitis 03/26/2021   Herpes simplex type 1 infection 03/25/2021   ADHD 03/19/2021   Recurrent boils 02/04/2018    History of kidney stones 12/17/2017   Labial cyst 09/04/2017   Observed sleep apnea 09/04/2017   Depression     Past Medical History:  Diagnosis Date   ADHD    Allergy    Anemia, iron deficiency    Anxiety    Arthritis    CIN III (cervical intraepithelial neoplasia grade III) with severe dysplasia 11/2008   LEEP-D&C-HYSTEROSCOPY-SHOWED CIN II AND CIN III   Depression    DUB (dysfunctional uterine bleeding)    Herpes simplex type 1 infection 11/2020   Iron deficiency anemia secondary to blood loss (chronic) 07/29/2012   Kidney stones    Migraines    Sleep apnea    Type 2 diabetes mellitus ([place])     Past Surgical History:  Procedure Laterality Date   ANTERIOR CRUCIATE LIGAMENT REPAIR  1998   LEFT   CERVICAL BIOPSY  W/ LOOP ELECTRODE EXCISION  11/2008   CIN 3 free margins   COLPOSCOPY     Cyst removed from groin     INTRAUTERINE DEVICE INSERTION  03/09/2017   Mirena   KNEE ARTHROCENTESIS  2006  MOUTH SURGERY     PILONIDAL CYST EXCISION  1999    Current Outpatient Medications  Medication Sig Dispense Refill   amphetamine-dextroamphetamine (ADDERALL) 20 MG tablet Take 20 mg by mouth 3 (three) times daily.      clonazePAM (KLONOPIN) 0.5 MG tablet Take 0.5 mg by mouth at bedtime as needed for anxiety.     cyclobenzaprine (FLEXERIL) 10 MG tablet SMARTSIG:0.5-1 Tablet(s) By Mouth Every Evening     levonorgestrel (MIRENA) 20 MCG/24HR IUD 1 each by Intrauterine route once.     Methocarbamol (ROBAXIN PO) Take by mouth.     valACYclovir (VALTREX) 1000 MG tablet      No current facility-administered medications for this visit.     ALLERGIES: Ciprofloxacin, Clindamycin/lincomycin, Doxycycline, Metronidazole, Other, Penicillins, and Sulfa antibiotics  Family History  Problem Relation Age of Onset   Lupus Mother    Cancer Father        Throid cancer   Diabetes Father    Hypertension Father    Hyperlipidemia Father    Heart disease Paternal Grandfather    Cancer Paternal  Grandfather        Prostate   Breast cancer Neg Hx    Colon cancer Neg Hx     Social History   Socioeconomic History   Marital status: Single    Spouse name: Not on file   Number of children: 0   Years of education: Not on file   Highest education level: Not on file  Occupational History   Not on file  Tobacco Use   Smoking status: Every Day    Packs/day: 0.50    Types: Cigarettes   Smokeless tobacco: Never   Tobacco comments:    Started smoking ~ 41 y/o  Vaping Use   Vaping Use: Never used  Substance and Sexual Activity   Alcohol use: Yes    Alcohol/week: 0.0 standard drinks of alcohol    Comment: Rare   Drug use: No   Sexual activity: Yes    Partners: Male    Birth control/protection: I.U.D.    Comment: 1st intercourse 41 yo-more than 5 partners Mirena 03/09/2017  Other Topics Concern   Not on file  Social History Narrative   P2, G0   Household- pt and boyfriend    Works at the [employer] Strain: Not on [employer] Insecurity: Not on file  Transportation Needs: Not on file  Physical Activity: Not on file  Stress: Not on file  Social Connections: Not on file  Intimate Partner Violence: Not on file    Review of Systems  All other systems reviewed and are negative.   PHYSICAL EXAMINATION:    BP 118/82   Pulse 77   Ht '5\' 4"'$  (1.626 m)   Wt 193 lb (87.5 kg)   SpO2 98%   BMI 33.13 kg/m     General appearance: alert, cooperative and appears stated age  Pelvic: External genitalia:  no lesions              Urethra:  normal appearing urethra with no masses, tenderness or lesions              Bartholins and Skenes: normal                 Vagina: normal appearing vagina with normal color and discharge, no lesions              Cervix: no cervical  motion tenderness, no lesions, and IUD string 1-2 cm . Slight pink d/c at vaginal apex.               Bimanual Exam:  Uterus:  normal size, contour,  position, consistency, mobility, non-tender and anteverted              Adnexa: no mass, fullness, tenderness              Bladder: mildly tender to palpation (urethra not tender)  Chaperone was present for exam.  1. Urgency of urination - Urinalysis, Complete - Urine Culture  2. Urethral pain Recent negative testing for GC/CT/Trich - Ambulatory referral to Urology - Urine Culture -Try azo for the next 2 days to see if it helps her pain  3. Hematuria, microscopic Last 3 urinalysis with + blood. She isn't cycling today but has minimal pink d/c at the vaginal apex - Ambulatory referral to Urology - Urine Culture  4. Vaginal pain - WET PREP FOR TRICH, YEAST, CLUE  5. Yeast vaginitis - fluconazole (DIFLUCAN) 150 MG tablet; Take 1 tablet (150 mg total) by mouth once for 1 dose. Take one tablet.  Repeat in 72 hours if symptoms are not completely resolved.  Dispense: 2 tablet; Refill: 0  CC: Rubbie Battiest, NP

## 2022-01-10 LAB — URINE CULTURE
MICRO NUMBER:: 13981228
Result:: NO GROWTH
SPECIMEN QUALITY:: ADEQUATE

## 2022-01-13 DIAGNOSIS — J039 Acute tonsillitis, unspecified: Secondary | ICD-10-CM | POA: Diagnosis not present

## 2022-01-13 NOTE — Telephone Encounter (Signed)
Fax sent successfully.

## 2022-01-15 NOTE — Telephone Encounter (Signed)
[name] @ Dr. Keane Scrape office- Spoke w/ pt on 10/2 and pt declined referral appt.   Ok to close?

## 2022-01-17 ENCOUNTER — Other Ambulatory Visit: Payer: Self-pay | Admitting: Radiology

## 2022-01-17 ENCOUNTER — Telehealth: Payer: Self-pay | Admitting: *Deleted

## 2022-01-17 ENCOUNTER — Other Ambulatory Visit: Payer: Self-pay | Admitting: Nurse Practitioner

## 2022-01-17 DIAGNOSIS — B9689 Other specified bacterial agents as the cause of diseases classified elsewhere: Secondary | ICD-10-CM

## 2022-01-17 MED ORDER — METRONIDAZOLE 0.75 % EX GEL: CUTANEOUS | 0 refills | Status: DC

## 2022-01-17 MED ORDER — METRONIDAZOLE 0.75 % VA GEL: 1.0000 | Freq: Every day | VAGINAL | 0 refills | Status: DC

## 2022-01-17 NOTE — Telephone Encounter (Signed)
Patient aware of recommendations. Medication sent into pharmacy.

## 2022-01-17 NOTE — Telephone Encounter (Signed)
Ok to offer metrogel- if no improvement after treatment will  need to be seen

## 2022-01-17 NOTE — Telephone Encounter (Signed)
Patient at [facility] on [facility] in [place] and states the Metrogel prescription is not available.  Pharmacist states the pharmacy is now closed as it is 9:00 pm and that patient will need to use a 24 hour pharmacy instead.   Rx for Metrogel 0.75%, place vaginally at night for 5 nights, RF none.  Sent electronically to [facility] on [address] in [place].

## 2022-01-17 NOTE — Telephone Encounter (Signed)
Medication refill request: valtrex Last AEX:  04-01-21 Next AEX: not scheduled Last MMG (if hormonal medication request): n/a Refill authorized: please approve if appropriate

## 2022-01-17 NOTE — Telephone Encounter (Signed)
Patient called with complaints of Vaginal discharge. Patient would like to be treated with Metronidazole. Patient states these symptoms may be related to BV.   I will route to Provider for recommendations.

## 2022-01-18 ENCOUNTER — Other Ambulatory Visit: Payer: Self-pay | Admitting: Radiology

## 2022-01-20 ENCOUNTER — Other Ambulatory Visit: Payer: Self-pay | Admitting: Radiology

## 2022-01-20 DIAGNOSIS — L732 Hidradenitis suppurativa: Secondary | ICD-10-CM | POA: Diagnosis not present

## 2022-01-22 ENCOUNTER — Other Ambulatory Visit: Payer: Self-pay | Admitting: Obstetrics and Gynecology

## 2022-01-22 NOTE — Telephone Encounter (Signed)
Last AEX 04/01/2021--nothing scheduled for 2023.

## 2022-01-27 DIAGNOSIS — F331 Major depressive disorder, recurrent, moderate: Secondary | ICD-10-CM | POA: Diagnosis not present

## 2022-01-27 DIAGNOSIS — F411 Generalized anxiety disorder: Secondary | ICD-10-CM | POA: Diagnosis not present

## 2022-02-10 DIAGNOSIS — F411 Generalized anxiety disorder: Secondary | ICD-10-CM | POA: Diagnosis not present

## 2022-02-10 DIAGNOSIS — J029 Acute pharyngitis, unspecified: Secondary | ICD-10-CM | POA: Diagnosis not present

## 2022-02-10 DIAGNOSIS — J069 Acute upper respiratory infection, unspecified: Secondary | ICD-10-CM | POA: Diagnosis not present

## 2022-02-10 DIAGNOSIS — F331 Major depressive disorder, recurrent, moderate: Secondary | ICD-10-CM | POA: Diagnosis not present

## 2022-02-10 DIAGNOSIS — R051 Acute cough: Secondary | ICD-10-CM | POA: Diagnosis not present

## 2022-02-10 DIAGNOSIS — R030 Elevated blood-pressure reading, without diagnosis of hypertension: Secondary | ICD-10-CM | POA: Diagnosis not present

## 2022-02-25 DIAGNOSIS — R051 Acute cough: Secondary | ICD-10-CM | POA: Diagnosis not present

## 2022-02-25 DIAGNOSIS — R062 Wheezing: Secondary | ICD-10-CM | POA: Diagnosis not present

## 2022-02-25 DIAGNOSIS — J029 Acute pharyngitis, unspecified: Secondary | ICD-10-CM | POA: Diagnosis not present

## 2022-02-28 DIAGNOSIS — L732 Hidradenitis suppurativa: Secondary | ICD-10-CM | POA: Diagnosis not present

## 2022-03-13 DIAGNOSIS — F411 Generalized anxiety disorder: Secondary | ICD-10-CM | POA: Diagnosis not present

## 2022-03-13 DIAGNOSIS — F331 Major depressive disorder, recurrent, moderate: Secondary | ICD-10-CM | POA: Diagnosis not present

## 2022-03-24 ENCOUNTER — Ambulatory Visit: Payer: BC Managed Care – PPO | Admitting: Nurse Practitioner

## 2022-03-24 VITALS — BP 128/70 | Wt 193.0 lb

## 2022-03-24 DIAGNOSIS — N3001 Acute cystitis with hematuria: Secondary | ICD-10-CM | POA: Diagnosis not present

## 2022-03-24 DIAGNOSIS — R300 Dysuria: Secondary | ICD-10-CM

## 2022-03-24 MED ORDER — FLUCONAZOLE 150 MG PO TABS: 150.0000 mg | ORAL_TABLET | ORAL | 0 refills | Status: DC

## 2022-03-24 MED ORDER — NITROFURANTOIN MONOHYD MACRO 100 MG PO CAPS: 100.0000 mg | ORAL_CAPSULE | Freq: Two times a day (BID) | ORAL | 0 refills | Status: DC

## 2022-03-24 NOTE — Progress Notes (Signed)
   Acute Office Visit  Subjective:    Patient ID: [name], female    DOB: 1980/12/18, 41 y.o.   MRN: [id number]   HPI 41 y.o. presents today for dysuria, frequency, urgency, and hematuria x 2 days. Just got back from [place]. Partner has been having significant diarrhea and they had intercourse 2 days ago.    Review of Systems  Constitutional: Negative.   Genitourinary:  Positive for dysuria, frequency, hematuria and urgency. Negative for difficulty urinating and flank pain.       Objective:    Physical Exam Constitutional:      Appearance: Normal appearance.  Abdominal:     Tenderness: There is no right CVA tenderness or left CVA tenderness.   GU: not indicated  BP 128/70   Wt 193 lb (87.5 kg)   BMI 33.13 kg/m  Wt Readings from Last 3 Encounters:  03/24/22 193 lb (87.5 kg)  01/09/22 193 lb (87.5 kg)  08/05/21 198 lb 4 oz (89.9 kg)        UA: 1+ leukocytes, negative nitrites, 2+ protein, 3+ blood, 1+ bilirubin, amber/cloudy. Microscopic: wbc 10-20, rbc >60, many bacteria   Assessment & Plan:   Problem List Items Addressed This Visit   None Visit Diagnoses     Acute cystitis with hematuria    -  Primary   Relevant Medications   nitrofurantoin, macrocrystal-monohydrate, (MACROBID) 100 MG capsule   fluconazole (DIFLUCAN) 150 MG tablet   Dysuria       Relevant Orders   Urinalysis,Complete w/RFL Culture      Plan: Acute cystitis - Macrobid 100 mg BID x 7 days. Culture pending. H/O yeast infections with antibiotic use, Diflucan provided. Declines pyridium.     Tamela Gammon DNP, 12:05 PM 03/24/2022

## 2022-03-25 LAB — URINALYSIS, COMPLETE W/RFL CULTURE
Glucose, UA: NEGATIVE
Hyaline Cast: NONE SEEN /LPF
Ketones, ur: NEGATIVE
Nitrites, Initial: NEGATIVE
RBC / HPF: >=60 /HPF — AB (ref 0–2)
Specific Gravity, Urine: 1.02 (ref 1.001–1.035)
pH: 6.5 (ref 5.0–8.0)

## 2022-03-25 LAB — URINE CULTURE
MICRO NUMBER:: 14296855
Result:: NO GROWTH
SPECIMEN QUALITY:: ADEQUATE

## 2022-03-25 LAB — CULTURE INDICATED

## 2022-03-26 ENCOUNTER — Encounter: Payer: Self-pay | Admitting: Nurse Practitioner

## 2022-03-26 ENCOUNTER — Other Ambulatory Visit: Payer: Self-pay | Admitting: Nurse Practitioner

## 2022-03-26 ENCOUNTER — Other Ambulatory Visit: Payer: Self-pay

## 2022-03-26 DIAGNOSIS — R3989 Other symptoms and signs involving the genitourinary system: Secondary | ICD-10-CM

## 2022-03-26 DIAGNOSIS — R310 Gross hematuria: Secondary | ICD-10-CM

## 2022-03-26 DIAGNOSIS — R3129 Other microscopic hematuria: Secondary | ICD-10-CM

## 2022-03-26 DIAGNOSIS — R110 Nausea: Secondary | ICD-10-CM

## 2022-03-26 MED ORDER — TAMSULOSIN HCL 0.4 MG PO CAPS: 0.4000 mg | ORAL_CAPSULE | Freq: Every day | ORAL | 0 refills | Status: DC

## 2022-03-26 MED ORDER — ONDANSETRON HCL 4 MG PO TABS: 4.0000 mg | ORAL_TABLET | Freq: Three times a day (TID) | ORAL | 0 refills | Status: DC | PRN

## 2022-03-28 ENCOUNTER — Ambulatory Visit (INDEPENDENT_AMBULATORY_CARE_PROVIDER_SITE_OTHER): Payer: BC Managed Care – PPO

## 2022-03-28 ENCOUNTER — Ambulatory Visit
Admission: EM | Admit: 2022-03-28 | Discharge: 2022-03-28 | Disposition: A | Discharge status: Home / Self Care | Payer: BC Managed Care – PPO | Attending: Urgent Care | Admitting: Urgent Care

## 2022-03-28 DIAGNOSIS — R1011 Right upper quadrant pain: Secondary | ICD-10-CM | POA: Diagnosis not present

## 2022-03-28 DIAGNOSIS — R319 Hematuria, unspecified: Secondary | ICD-10-CM | POA: Diagnosis not present

## 2022-03-28 DIAGNOSIS — R61 Generalized hyperhidrosis: Secondary | ICD-10-CM | POA: Diagnosis not present

## 2022-03-28 DIAGNOSIS — R42 Dizziness and giddiness: Secondary | ICD-10-CM | POA: Diagnosis not present

## 2022-03-28 LAB — POCT INFLUENZA A/B
Influenza A, POC: NEGATIVE
Influenza B, POC: NEGATIVE

## 2022-03-28 LAB — POCT URINALYSIS DIP (MANUAL ENTRY)
Bilirubin, UA: NEGATIVE
Glucose, UA: NEGATIVE mg/dL
Ketones, POC UA: NEGATIVE mg/dL
Leukocytes, UA: NEGATIVE
Nitrite, UA: NEGATIVE
Protein Ur, POC: NEGATIVE mg/dL
Spec Grav, UA: 1.015 (ref 1.010–1.025)
Urobilinogen, UA: 0.2 E.U./dL
pH, UA: 7 (ref 5.0–8.0)

## 2022-03-28 LAB — POCT URINE PREGNANCY: Preg Test, Ur: NEGATIVE

## 2022-03-28 NOTE — Discharge Instructions (Addendum)
Your urine does still show blood, but otherwise is normal. Your flu test is negative. Your abdominal and chest x-ray are unremarkable. We will call with results of your labs tomorrow. If you develop any new or worsening symptoms, primarily worsening abdominal pain, or intractable nausea and vomiting, please head to the emergency room. You may continue taking your Keflex as prescribed. Please also continue taking the Zofran as needed.

## 2022-03-28 NOTE — ED Triage Notes (Signed)
Pt reports hematuria on return from [place] 12/10-seen by OB/GYN-started on abx-was advised urine cx was negative-nausea and fatigue started 2 days-NAD-steady gait

## 2022-03-28 NOTE — ED Provider Notes (Signed)
[facility]    CSN: [id number] Arrival date & time: 03/28/22  1050      History   Chief Complaint Chief Complaint  Patient presents with   Hematuria    HPI [name] is a 41 y.o. female.   Pleasant 41 year old female presents due to concerns of diaphoresis, fatigue, dizziness.  She apparently was in [place] last week, her boyfriend had some type of GI bug.  They flew home on Sunday and she started having urinary symptoms.  Went to her OB/GYN on Monday and was prescribed Macrobid.  She took this for 2 days but was then called to stop it as her urine culture was normal.  She also has a history of HS, started taking Keflex on Wednesday to help with her flare.  Patient states that all of her urinary symptoms that she previously had including the hematuria have resolved, but as of 2 days ago, she started having some nausea, fatigue, and dizziness.  She denies a fever but states she feels very sweaty. She reports normal bowel movements and no v/d.    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   Kidney stones    Migraines    Sleep apnea    Type 2 diabetes mellitus ([place])     Patient Active Problem List   Diagnosis Date Noted   Pain in left foot 10/30/2021   Diabetes mellitus without complication ([place]) 69/67/8938   Annual physical exam 08/05/2021   PCP notes >>>>>> 03/26/2021   Hydradenitis 03/26/2021   Herpes simplex type 1 infection 03/25/2021   ADHD 03/19/2021   Recurrent boils 02/04/2018   History of kidney stones 12/17/2017   Labial cyst 09/04/2017   Observed sleep apnea  09/04/2017   Depression     Past Surgical History:  Procedure Laterality Date   ANTERIOR CRUCIATE LIGAMENT REPAIR  1998   LEFT   CERVICAL BIOPSY  W/ LOOP ELECTRODE EXCISION  11/2008   CIN 3 free margins   COLPOSCOPY     Cyst removed from groin     INTRAUTERINE DEVICE INSERTION  03/09/2017   Mirena   KNEE ARTHROCENTESIS  2006   MOUTH SURGERY     PILONIDAL CYST EXCISION  1999    OB History     Gravida  0   Para      Term      Preterm      AB      Living         SAB      IAB      Ectopic      Multiple      Live Births               Home Medications    Prior to Admission medications   Medication Sig Start Date End Date Taking? Authorizing Provider  amphetamine-dextroamphetamine (ADDERALL) 20 MG tablet Take 20 mg by mouth 3 (three) times daily.     [provider]  clonazePAM (KLONOPIN) 0.5 MG tablet Take 0.5 mg by mouth at bedtime  as needed for anxiety.    [provider]  cyclobenzaprine (FLEXERIL) 10 MG tablet SMARTSIG:0.5-1 Tablet(s) By Mouth Every Evening 07/25/21   [provider]  fluconazole (DIFLUCAN) 150 MG tablet Take 1 tablet (150 mg total) by mouth every 3 (three) days. 03/24/22   Tamela Gammon, NP  levonorgestrel (MIRENA) 20 MCG/24HR IUD 1 each by Intrauterine route once.    [provider]  Methocarbamol (ROBAXIN PO) Take by mouth.    [provider]  ondansetron (ZOFRAN) 4 MG tablet Take 1 tablet (4 mg total) by mouth every 8 (eight) hours as needed for nausea or vomiting. 03/26/22   Tamela Gammon, NP  tamsulosin (FLOMAX) 0.4 MG CAPS capsule Take 1 capsule (0.4 mg total) by mouth daily after breakfast. 03/26/22   Tamela Gammon, NP  valACYclovir (VALTREX) 1000 MG tablet TAKE 1 TABLET(1000 MG) BY MOUTH TWICE DAILY 01/22/22   Tamela Gammon, NP    Family History Family History  Problem Relation Age of Onset   Lupus Mother    Cancer Father        Throid cancer   Diabetes  Father    Hypertension Father    Hyperlipidemia Father    Heart disease Paternal Grandfather    Cancer Paternal Grandfather        Prostate   Breast cancer Neg Hx    Colon cancer Neg Hx     Social History Social History   Tobacco Use   Smoking status: Every Day    Packs/day: 0.50    Types: Cigarettes   Smokeless tobacco: Never   Tobacco comments:    Started smoking ~ 41 y/o  Vaping Use   Vaping Use: Never used  Substance Use Topics   Alcohol use: Yes    Alcohol/week: 0.0 standard drinks of alcohol    Comment: Rare   Drug use: No     Allergies   Ciprofloxacin, Clindamycin/lincomycin, Doxycycline, Metronidazole, Other, Penicillins, and Sulfa antibiotics   Review of Systems Review of Systems  Genitourinary:  Positive for hematuria.  As per HPI   Physical Exam Triage Vital Signs ED Triage Vitals  Enc Vitals Group     BP 03/28/22 1240 (!) 134/92     Pulse Rate 03/28/22 1240 (!) 108     Resp 03/28/22 1240 17     Temp 03/28/22 1240 98.4 F (36.9 C)     Temp Source 03/28/22 1240 Oral     SpO2 03/28/22 1240 98 %     Weight --      Height --      Head Circumference --      Peak Flow --      Pain Score 03/28/22 1251 0     Pain Loc --      Pain Edu? --      Excl. in [place]? --    No data found.  Updated Vital Signs BP (!) 134/92 (BP Location: Left Arm)   Pulse (!) 108   Temp 98.4 F (36.9 C) (Oral)   Resp 17   SpO2 98%   Visual Acuity Right Eye Distance:   Left Eye Distance:   Bilateral Distance:    Right Eye Near:   Left Eye Near:    Bilateral Near:     Physical Exam Vitals and nursing note reviewed.  Constitutional:      General: She is not in acute distress.    Appearance: Normal appearance. She is diaphoretic. She is not ill-appearing or toxic-appearing.  HENT:     Head: Normocephalic and atraumatic.     Right Ear: Tympanic membrane, ear canal and external ear normal. There is no impacted cerumen.     Left Ear: Tympanic membrane, ear canal  and external ear normal. There is no impacted cerumen.     Nose: Nose normal. No congestion or rhinorrhea.     Mouth/Throat:     Mouth: Mucous membranes are moist.     Pharynx: Oropharynx is clear. No oropharyngeal exudate or posterior oropharyngeal erythema.  Eyes:     General: No scleral icterus.       Right eye: No discharge.        Left eye: No discharge.     Extraocular Movements: Extraocular movements intact.     Conjunctiva/sclera: Conjunctivae normal.     Pupils: Pupils are equal, round, and reactive to light.  Cardiovascular:     Rate and Rhythm: Normal rate and regular rhythm.     Pulses: Normal pulses.     Heart sounds: Normal heart sounds. No murmur heard.    No friction rub. No gallop.     Comments: Recheck rate 96 Pulmonary:     Effort: Pulmonary effort is normal. No respiratory distress.     Breath sounds: Normal breath sounds. No stridor. No wheezing, rhonchi or rales.  Chest:     Chest wall: No tenderness.  Abdominal:     General: Abdomen is flat. Bowel sounds are normal. There is no distension.     Palpations: Abdomen is soft.     Tenderness: There is abdominal tenderness (RUQ tenderness to deep palpation). There is no right CVA tenderness, left CVA tenderness, guarding or rebound.  Musculoskeletal:        General: No swelling. Normal range of motion.     Cervical back: Normal range of motion and neck supple. No rigidity or tenderness.     Right lower leg: No edema.     Left lower leg: No edema.  Lymphadenopathy:     Cervical: No cervical adenopathy.  Skin:    General: Skin is warm.     Capillary Refill: Capillary refill takes less than 2 seconds.     Coloration: Skin is not jaundiced.     Findings: No bruising, erythema or rash.  Neurological:     General: No focal deficit present.     Mental Status: She is alert and oriented to person, place, and time.     Sensory: No sensory deficit.     Motor: No weakness.     Gait: Gait normal.  Psychiatric:         Mood and Affect: Mood normal.      UC Treatments / Results  Labs (all labs ordered are listed, but only abnormal results are displayed) Labs Reviewed  POCT URINALYSIS DIP (MANUAL ENTRY) - Abnormal; Notable for the following components:      Result Value   Blood, UA large (*)    All other components within normal limits  CBC WITH DIFFERENTIAL/PLATELET  COMPREHENSIVE METABOLIC PANEL  POCT INFLUENZA A/B  POCT URINE PREGNANCY    EKG   Radiology DG Abd Acute W/Chest  Result Date: 03/28/2022 CLINICAL DATA:  Right upper quadrant abdominal pain. EXAM: DG ABDOMEN ACUTE WITH 1 VIEW CHEST COMPARISON:  None Available. FINDINGS: There is no evidence of dilated bowel loops or free intraperitoneal air. Moderate amount of stool seen throughout the colon. Intrauterine device is noted in the pelvis. Heart size and mediastinal contours are within normal  limits. Both lungs are clear. IMPRESSION: Moderate stool burden. No abnormal bowel dilatation. No acute cardiopulmonary disease. Electronically Signed   By: Marijo Conception M.D.   On: 03/28/2022 13:58    Procedures Procedures (including critical care time)  Medications Ordered in UC Medications - No data to display  Initial Impression / Assessment and Plan / UC Course  I have reviewed the triage vital signs and the nursing notes.  Pertinent labs & imaging results that were available during my care of the patient were reviewed by me and considered in my medical decision making (see chart for details).  Clinical Course as of 03/28/22 1730  Fri Mar 28, 2022  1413 Recheck pulse rate 96 [WC]    Clinical Course User Index [WC] [place], Oriyah Lamphear L, PA    RUQ pain -patient had complained of nausea, but had no right upper quadrant pain until palpated on exam.  She is not having any right CVA tenderness.  X-ray does reveal moderate stool burden, however patient denies any constipation.  Patient did recently have a urine culture that was normal.  She  admits to a history of known renal stones, however none are noted on x-ray.  Will obtain CMP and CBC for further evaluation of possible causes of her pain.  She is showing no signs of acute abdomen on exam. Murphy exam negative. Pt has zofran already at home, prescribed from gynecology. Hematuria -patient recently on 2 days of Macrobid, and currently on Keflex.  Admits her urinary symptoms have resolved.  There is blood noted on UA dipstick in office, however patient states previously she had gross hematuria visible with her eyes, and this has since resolved. Diaphoresis -patient intermittently sweating on exam.  Temperature is normal. Dizziness -recheck temp and pulse rate, normal.  Vital signs appear stable.  I suspect patient contracted some type of viral illness while [place].  Flu test negative.  Patient performed home COVID test which was also negative. Strict ER precautions reviewed with pt.   Final Clinical Impressions(s) / UC Diagnoses   Final diagnoses:  RUQ pain  Hematuria, unspecified type  Diaphoresis  Dizziness     Discharge Instructions      Your urine does still show blood, but otherwise is normal. Your flu test is negative. Your abdominal and chest x-ray are unremarkable. We will call with results of your labs tomorrow. If you develop any new or worsening symptoms, primarily worsening abdominal pain, or intractable nausea and vomiting, please head to the emergency room. You may continue taking your Keflex as prescribed. Please also continue taking the Zofran as needed.    ED Prescriptions   None    PDMP not reviewed this encounter.   Chaney Malling, [place] 03/28/22 1734

## 2022-03-29 LAB — CBC WITH DIFFERENTIAL/PLATELET
Basophils Absolute: 0 10*3/uL (ref 0.0–0.2)
Basos: 0 %
EOS (ABSOLUTE): 0 10*3/uL (ref 0.0–0.4)
Eos: 0 %
Hematocrit: 43.5 % (ref 34.0–46.6)
Hemoglobin: 14.5 g/dL (ref 11.1–15.9)
Immature Grans (Abs): 0 10*3/uL (ref 0.0–0.1)
Immature Granulocytes: 0 %
Lymphocytes Absolute: 1.9 10*3/uL (ref 0.7–3.1)
Lymphs: 25 %
MCH: 28.7 pg (ref 26.6–33.0)
MCHC: 33.3 g/dL (ref 31.5–35.7)
MCV: 86 fL (ref 79–97)
Monocytes Absolute: 0.5 10*3/uL (ref 0.1–0.9)
Monocytes: 7 %
Neutrophils Absolute: 5 10*3/uL (ref 1.4–7.0)
Neutrophils: 68 %
Platelets: 454 10*3/uL — ABNORMAL HIGH (ref 150–450)
RBC: 5.06 x10E6/uL (ref 3.77–5.28)
RDW: 12.7 % (ref 11.7–15.4)
WBC: 7.5 10*3/uL (ref 3.4–10.8)

## 2022-03-29 LAB — COMPREHENSIVE METABOLIC PANEL
ALT: 14 IU/L (ref 0–32)
AST: 13 IU/L (ref 0–40)
Albumin/Globulin Ratio: 1.7 (ref 1.2–2.2)
Albumin: 4.3 g/dL (ref 3.9–4.9)
Alkaline Phosphatase: 77 IU/L (ref 44–121)
BUN/Creatinine Ratio: 8 — ABNORMAL LOW (ref 9–23)
BUN: 5 mg/dL — ABNORMAL LOW (ref 6–20)
Bilirubin Total: <0.2 mg/dL (ref 0.0–1.2)
CO2: 24 mmol/L (ref 20–29)
Calcium: 9.2 mg/dL (ref 8.7–10.2)
Chloride: 102 mmol/L (ref 96–106)
Creatinine, Ser: 0.6 mg/dL (ref 0.57–1.00)
Globulin, Total: 2.5 g/dL (ref 1.5–4.5)
Glucose: 83 mg/dL (ref 70–99)
Potassium: 4.5 mmol/L (ref 3.5–5.2)
Sodium: 140 mmol/L (ref 134–144)
Total Protein: 6.8 g/dL (ref 6.0–8.5)
eGFR: 117 mL/min/{1.73_m2} (ref >59)

## 2022-04-03 DIAGNOSIS — U071 COVID-19: Secondary | ICD-10-CM | POA: Diagnosis not present

## 2022-04-08 NOTE — Progress Notes (Unsigned)
New Patient Note  RE: [name] MRN: [id number] DOB: [birth year] Date of Office Visit: 04/09/2022  Consult requested by: Rosario Adie, MD Primary care provider: Colon Branch, MD  Chief Complaint: Sinusitis, Frequent Infections, Wheezing, and Cough  History of Present Illness: I had the pleasure of seeing [name] for initial evaluation at the [facility] on 04/09/2022. She is a 41 y.o. female, who is referred here by Colon Branch, MD for the evaluation of asthma and frequent infections.  Patient has been getting a lot of respiratory infections for the past 15 month. She seems to be getting sick every 1-2 weeks.  She has been diagnosed with strep, pharyngitis, swollen lymph nodes, bronchitis, HSV, Shingles.  Patient is following with dermatology for HS.   Denies pneumonia, GI infections/diarrhea, fungal infections.   No prior immune work up.   Patient reports 12+ antibiotic use in the last 12 months. She has been on keflex, amoxicillin, penicillin, clindamycin, Augmentin, zpak, doxycycline which do seem to help.  Currently on antibiotics for HS flare up.   Patient does not have any secondary causes of immunodeficiency including chronic steroid use, diabetes mellitus, protein losing enteropathy, renal or hepatic dysfunction, history of cancer or irradiation or history of HIV, hepatitis B or C.  Patient is pre-diabetic according to her HgA1C.   Patient lives with boyfriend who has a 27 year old son since 2019.  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  She reports symptoms of PND, some sneezing. Symptoms have been going on for 1 years. The symptoms are present all year around. Other triggers include exposure to unknown - possibly worse at work. Anosmia: no. Headache: no. She has used zyrtec with no improvement in symptoms. Previous work up includes: no. Previous ENT evaluation: yes. Previous sinus imaging: yes. History of nasal polyps: no. Last eye  exam: not recently History of reflux: yes and takes omeprazole and tums prn.  She reports symptoms of chest tightness, coughing, wheezing for 20+ years. Current medications include albuterol prn which help. She reports not using aerochamber with inhalers. She tried the following inhalers: albuterol, Advair. Main triggers are environment and infections. Used Advair for 2 years when living in [place] and [place].    In the last month, frequency of symptoms: depends. Frequency of nocturnal symptoms: 0x/month. Frequency of SABA use: depends. In the last 12 months, emergency room visits/urgent care visits/doctor office visits or hospitalizations due to respiratory issues: one time. In the last 12 months, oral steroids courses: one. Lifetime history of hospitalization for respiratory issues: no. Prior intubations: no. History of pneumonia: no. She was evaluated by allergist in the past. Smoking exposure: 1/2 pack per day x 20+ years. Up to date with flu vaccine: no. Up to date with COVID-19 vaccine: got first 2 boosters. Prior Covid-19 infection: last week.  08/13/2021 CT neck "IMPRESSION:  1. No abscess identified  2. There is circumferential atherosclerotic plaque in the left common carotid artery just below the carotid bifurcation. There is only mild stenosis. Correlate for vascular risk factors. "  Assessment and Plan: [name] is a 41 y.o. female with: Recurrent infections Patient has been getting sick every 1-2 weeks and had over 12+ courses of antibiotics. No prior immune evaluation. Patient is a smoker. Today's skin prick testing showed: Negative to indoor/outdoor allergens and common foods.  Keep track of infections and antibiotics use. Get bloodwork to look at immune system. Strongly encouraged smoking cessation.   Chronic  rhinitis Perennial rhinitis symptoms for 1 year.  No prior allergy evaluation. Zyrtec ineffective.  Today's skin prick testing showed: Negative to indoor/outdoor  allergens.  Get bloodwork.  Start Ryaltris (olopatadine + mometasone nasal spray combination) 1-2 sprays per nostril twice a day. Sample given. This replaces your other nasal sprays. If this works well for you, then have [facility] ship the medication to your home - prescription already sent in.  Nasal saline spray (i.e., Simply Saline) or nasal saline lavage (i.e., NeilMed) is recommended as needed and prior to medicated nasal sprays. Stop smoking.   Mild intermittent asthma without complication Used to be on Advair in the past when living in [place] and [place]. Had Covid-19 last week. May use albuterol rescue inhaler 2 puffs every 4 to 6 hours as needed for shortness of breath, chest tightness, coughing, and wheezing. May use albuterol rescue inhaler 2 puffs 5 to 15 minutes prior to strenuous physical activities. Monitor frequency of use.  Get spirometry at next visit instead of today due to recent Covid-19 infection.   Gastroesophageal reflux disease See handout for lifestyle and dietary modifications.  Elevated blood pressure reading Monitor at home. If still above 130/80 on a consistent basis, follow up with PCP.   Family history of autoimmune disorder Patient concerned if she has autoimmune issues predisposing her to these infections - unlikely but will check ANA as per her request.   Return in about 4 weeks (around 05/07/2022).  Meds ordered this encounter  Medications   Olopatadine-Mometasone (RYALTRIS) 665-25 MCG/ACT SUSP    Sig: Place 1-2 sprays into the nose in the morning and at bedtime.    Dispense:  29 g    Refill:  5    [phone]   Lab Orders         Allergens w/Total IgE Area 2         CBC with Differential/Platelet         Complement, total         IgG, IgA, IgM         IgG 1, 2, 3, and 4         Diphtheria / Tetanus Antibody Panel         Strep pneumoniae 23 Serotypes IgG         ANA w/Reflex      Other allergy screening: Food allergy:  no Medication allergy:  side effects Hymenoptera allergy: no Urticaria: no Eczema:no  Diagnostics: Skin Testing: Environmental allergy panel and select foods. Negative to indoor/outdoor allergens and common foods.  Results discussed with patient/family.  Airborne Adult Perc - 04/09/22 1035     Time Antigen Placed 1035    Allergen Manufacturer Lavella Hammock    Location Back    Number of Test 59    Panel 1 Select    1. Control-Buffer 50% Glycerol Negative    2. Control-Histamine 1 mg/ml 2+    3. Albumin saline Negative    4. [place] Negative    5. [place] Negative    6. Johnson Negative    7. Geronimo Blue Negative    8. Hooker Omitted    9. Perennial Rye Negative    10. Sweet Vernal Negative    11. Timothy Negative    12. Cocklebur Negative    13. Burweed Marshelder Negative    14. Ragweed, short Negative    15. Ragweed, Giant Negative    16. Plantain,  English Negative    17. Lamb's Quarters Negative    18.  Sheep Sorrell Negative    19. Rough Pigweed Negative    20. Marsh Elder, Rough Negative    21. Mugwort, Common Negative    22. Ash mix Negative    23. Birch mix Negative    24. Beech American Negative    25. Box, Elder Negative    26. Cedar, red Negative    27. Cottonwood, [place] Negative    28. Elm mix Negative    29. Hickory Negative    30. Maple mix Negative    31. Oak, [place] mix Negative    32. Pecan Pollen Negative    33. Pine mix Negative    34. Sycamore Eastern Negative    35. [place], Black Pollen Negative    36. Alternaria alternata Negative    37. Cladosporium Herbarum Negative    38. Aspergillus mix Negative    39. Penicillium mix Negative    40. Bipolaris sorokiniana (Helminthosporium) Negative    41. Drechslera spicifera (Curvularia) Negative    42. Mucor plumbeus Negative    43. Fusarium moniliforme Negative    44. Aureobasidium pullulans (pullulara) Negative    45. Rhizopus oryzae Negative    46. Botrytis cinera Negative    47. Epicoccum  nigrum Negative    48. Phoma betae Negative    49. Candida Albicans Negative    50. Trichophyton mentagrophytes Negative    51. Mite, D Farinae  5,000 AU/ml Negative    52. Mite, D Pteronyssinus  5,000 AU/ml Negative    53. Cat Hair 10,000 BAU/ml Negative    54.  Dog Epithelia Negative    55. Mixed Feathers Negative    56. Horse Epithelia Negative    57. Cockroach, German Negative    58. Mouse Negative    59. Tobacco Leaf Negative             Food Perc - 04/09/22 1036       Test Information   Time Antigen Placed 1036    Allergen Manufacturer Lavella Hammock    Location Back    Number of allergen test [place]   1. Peanut Negative    2. Soybean food Negative    3. Wheat, whole Negative    4. Sesame Negative    5. Milk, cow Negative    6. Egg White, chicken Negative    7. Casein Negative    8. Shellfish mix Negative    9. Fish mix Negative    10. Cashew Negative             Past Medical History: Patient Active Problem List   Diagnosis Date Noted   Recurrent infections 04/09/2022   Mild intermittent asthma without complication 77/41/2878   Chronic rhinitis 04/09/2022   Gastroesophageal reflux disease 04/09/2022   Family history of autoimmune disorder 04/09/2022   Elevated blood pressure reading 04/09/2022   Pain in left foot 10/30/2021   Diabetes mellitus without complication ([place]) 67/67/2094   Annual physical exam 08/05/2021   PCP notes >>>>>> 03/26/2021   Hydradenitis 03/26/2021   Herpes simplex type 1 infection 03/25/2021   ADHD 03/19/2021   Recurrent boils 02/04/2018   History of kidney stones 12/17/2017   Labial cyst 09/04/2017   Observed sleep apnea 09/04/2017   Depression    Past Medical History:  Diagnosis Date   ADHD    Allergy    Anemia, iron deficiency    Anxiety    Arthritis    CIN III (  cervical intraepithelial neoplasia grade III) with severe dysplasia 11/2008   LEEP-D&C-HYSTEROSCOPY-SHOWED CIN II AND CIN III    Depression    DUB (dysfunctional uterine bleeding)    Herpes simplex type 1 infection 11/2020   Iron deficiency anemia secondary to blood loss (chronic) 07/29/2012   Kidney stones    Migraines    Recurrent upper respiratory infection (URI)    Sleep apnea    Type 2 diabetes mellitus ([place])    Past Surgical History: Past Surgical History:  Procedure Laterality Date   ANTERIOR CRUCIATE LIGAMENT REPAIR  1998   LEFT   CERVICAL BIOPSY  W/ LOOP ELECTRODE EXCISION  11/2008   CIN 3 free margins   COLPOSCOPY     Cyst removed from groin     INTRAUTERINE DEVICE INSERTION  03/09/2017   Mirena   KNEE ARTHROCENTESIS  2006   [place]   Medication List:  Current Outpatient Medications  Medication Sig Dispense Refill   amphetamine-dextroamphetamine (ADDERALL) 20 MG tablet Take 20 mg by mouth 3 (three) times daily.      clonazePAM (KLONOPIN) 0.5 MG tablet Take 0.5 mg by mouth at bedtime as needed for anxiety.     cyclobenzaprine (FLEXERIL) 10 MG tablet SMARTSIG:0.5-1 Tablet(s) By Mouth Every Evening     fluconazole (DIFLUCAN) 150 MG tablet Take 1 tablet (150 mg total) by mouth every 3 (three) days. 2 tablet 0   levonorgestrel (MIRENA) 20 MCG/24HR IUD 1 each by Intrauterine route once.     Methocarbamol (ROBAXIN PO) Take by mouth.     Olopatadine-Mometasone (RYALTRIS) G7528004 MCG/ACT SUSP Place 1-2 sprays into the nose in the morning and at bedtime. 29 g 5   ondansetron (ZOFRAN) 4 MG tablet Take 1 tablet (4 mg total) by mouth every 8 (eight) hours as needed for nausea or vomiting. 10 tablet 0   tamsulosin (FLOMAX) 0.4 MG CAPS capsule Take 1 capsule (0.4 mg total) by mouth daily after breakfast. 5 capsule 0   valACYclovir (VALTREX) 1000 MG tablet TAKE 1 TABLET(1000 MG) BY MOUTH TWICE DAILY 30 tablet 0   No current facility-administered medications for this visit.   Allergies: Allergies  Allergen Reactions   Ciprofloxacin Hives   Clindamycin/Lincomycin  Diarrhea   Doxycycline Nausea And Vomiting   Metronidazole Nausea And Vomiting   Other     Mycin drugs-Nausea   Penicillins Nausea And Vomiting   Sulfa Antibiotics Hives    Mouth ulcers   Social History: Social History   Socioeconomic History   Marital status: Single    Spouse name: Not on file   Number of children: 0   Years of education: Not on file   Highest education level: Not on file  Occupational History   Not on file  Tobacco Use   Smoking status: Every Day    Packs/day: 0.50    Types: Cigarettes   Smokeless tobacco: Never   Tobacco comments:    Started smoking ~ 41 y/o  Vaping Use   Vaping Use: Never used  Substance and Sexual Activity   Alcohol use: Yes    Alcohol/week: 0.0 standard drinks of alcohol    Comment: Rare   Drug use: No   Sexual activity: Yes    Partners: Male    Birth control/protection: I.U.D.    Comment: 1st intercourse 41 yo-more than 5 partners Mirena 03/09/2017  Other Topics Concern   Not on file  Social History Narrative   P2, G0  Household- pt and boyfriend    Works at the [employer] Strain: Not on [employer] Insecurity: Not on file  Transportation Needs: Not on file  Physical Activity: Not on file  Stress: Not on file  Social Connections: Not on file   Lives in a townhouse. Smoking: 1/2 pack per day x 20+ years Occupation: Marine scientist History: Water Damage/mildew in the house: no Charity fundraiser in the family room: yes Carpet in the bedroom: yes Heating: electric Cooling: central Pet: yes 1 cat x 16 yrs, 2 dogs x 1 yr and 2 months  Family History: Family History  Problem Relation Age of Onset   Lupus Mother    Cancer Father        Throid cancer   Diabetes Father    Hypertension Father    Hyperlipidemia Father    Heart disease Paternal Grandfather    Cancer Paternal Grandfather        Prostate   Breast cancer Neg Hx    Colon cancer Neg  Hx    Problem                               Relation Asthma                                   brother Eczema                                no Food allergy                          no Allergic rhino conjunctivitis     brother   Review of Systems  Constitutional:  Positive for chills and fever. Negative for appetite change and unexpected weight change.  HENT:  Positive for congestion, postnasal drip and sore throat. Negative for rhinorrhea.   Eyes:  Negative for itching.  Respiratory:  Negative for cough, chest tightness, shortness of breath and wheezing.   Cardiovascular:  Negative for chest pain.  Gastrointestinal:  Negative for abdominal pain.  Genitourinary:  Negative for difficulty urinating.  Skin:  Negative for rash.  Neurological:  Positive for headaches.    Objective: BP (!) 142/98 (BP Location: Left Arm, Patient Position: Sitting, Cuff Size: Normal)   Pulse (!) 108   Temp 98.3 F (36.8 C) (Temporal)   Resp 18   Ht '5\' 4"'$  (1.626 m)   Wt 191 lb 11.2 oz (87 kg)   SpO2 98%   BMI 32.91 kg/m  Body mass index is 32.91 kg/m. Physical Exam Vitals and nursing note reviewed.  Constitutional:      Appearance: Normal appearance. She is well-developed.  HENT:     Head: Normocephalic and atraumatic.     Right Ear: Tympanic membrane and external ear normal.     Left Ear: Tympanic membrane and external ear normal.     Nose: Nose normal.     Mouth/Throat:     Mouth: Mucous membranes are moist.     Pharynx: Oropharynx is clear.  Eyes:     Conjunctiva/sclera: Conjunctivae normal.  Cardiovascular:     Rate and Rhythm: Normal rate and regular rhythm.     Heart sounds: Normal heart sounds.  No murmur heard.    No friction rub. No gallop.  Pulmonary:     Effort: Pulmonary effort is normal.     Breath sounds: Normal breath sounds. No wheezing, rhonchi or rales.  Musculoskeletal:     Cervical back: Neck supple.  Skin:    General: Skin is warm.     Findings: No rash.   Neurological:     Mental Status: She is alert and oriented to person, place, and time.  Psychiatric:        Behavior: Behavior normal.   The plan was reviewed with the patient/family, and all questions/concerned were addressed.  It was my pleasure to see Chaselynn today and participate in her care. Please feel free to contact me with any questions or concerns.  Sincerely,  Rexene Alberts, DO Allergy & Immunology  [facility] office: [place] office: [phone]

## 2022-04-09 ENCOUNTER — Ambulatory Visit: Payer: BC Managed Care – PPO | Admitting: Allergy

## 2022-04-09 ENCOUNTER — Encounter: Payer: Self-pay | Admitting: Allergy

## 2022-04-09 ENCOUNTER — Other Ambulatory Visit: Payer: Self-pay

## 2022-04-09 VITALS — BP 142/98 | HR 108 | Temp 98.3°F | Resp 18 | Ht 64.0 in | Wt 191.7 lb

## 2022-04-09 DIAGNOSIS — B999 Unspecified infectious disease: Secondary | ICD-10-CM

## 2022-04-09 DIAGNOSIS — J310 Chronic rhinitis: Secondary | ICD-10-CM | POA: Insufficient documentation

## 2022-04-09 DIAGNOSIS — Z832 Family history of diseases of the blood and blood-forming organs and certain disorders involving the immune mechanism: Secondary | ICD-10-CM | POA: Insufficient documentation

## 2022-04-09 DIAGNOSIS — R030 Elevated blood-pressure reading, without diagnosis of hypertension: Secondary | ICD-10-CM | POA: Insufficient documentation

## 2022-04-09 DIAGNOSIS — J4520 Mild intermittent asthma, uncomplicated: Secondary | ICD-10-CM | POA: Diagnosis not present

## 2022-04-09 DIAGNOSIS — K219 Gastro-esophageal reflux disease without esophagitis: Secondary | ICD-10-CM | POA: Diagnosis not present

## 2022-04-09 MED ORDER — RYALTRIS 665-25 MCG/ACT NA SUSP: 1.0000 | Freq: Two times a day (BID) | NASAL | 5 refills | Status: DC

## 2022-04-09 NOTE — Assessment & Plan Note (Signed)
Used to be on Advair in the past when living in [place] and [place]. Had Covid-19 last week. May use albuterol rescue inhaler 2 puffs every 4 to 6 hours as needed for shortness of breath, chest tightness, coughing, and wheezing. May use albuterol rescue inhaler 2 puffs 5 to 15 minutes prior to strenuous physical activities. Monitor frequency of use.  Get spirometry at next visit instead of today due to recent Covid-19 infection.

## 2022-04-09 NOTE — Assessment & Plan Note (Signed)
Patient concerned if she has autoimmune issues predisposing her to these infections - unlikely but will check ANA as per her request.

## 2022-04-09 NOTE — Assessment & Plan Note (Signed)
See handout for lifestyle and dietary modifications. 

## 2022-04-09 NOTE — Patient Instructions (Addendum)
Today's skin testing showed: Negative to indoor/outdoor allergens and common foods.   Results given.  Recurrent infections Keep track of infections and antibiotics use. Get bloodwork We are ordering labs, so please allow 1-2 weeks for the results to come back. With the newly implemented Cures Act, the labs might be visible to you at the same time that they become visible to me. However, I will not address the results until all of the results are back, so please be patient.  In the meantime, continue recommendations in your patient instructions, including avoidance measures (if applicable), until you hear from me.  Rhinitis Start Ryaltris (olopatadine + mometasone nasal spray combination) 1-2 sprays per nostril twice a day. Sample given. This replaces your other nasal sprays. If this works well for you, then have [facility] ship the medication to your home - prescription already sent in.  Nasal saline spray (i.e., Simply Saline) or nasal saline lavage (i.e., NeilMed) is recommended as needed and prior to medicated nasal sprays. Stop smoking   Asthma May use albuterol rescue inhaler 2 puffs every 4 to 6 hours as needed for shortness of breath, chest tightness, coughing, and wheezing. May use albuterol rescue inhaler 2 puffs 5 to 15 minutes prior to strenuous physical activities. Monitor frequency of use.  Will get breathing test at next visit.   Heartburn: See handout for lifestyle and dietary modifications.  Blood pressure Monitor at home. If still above 130/80 on a consistent basis, follow up with PCP.   Follow up in 1 months or sooner if needed.

## 2022-04-09 NOTE — Assessment & Plan Note (Signed)
Monitor at home. If still above 130/80 on a consistent basis, follow up with PCP.

## 2022-04-09 NOTE — Assessment & Plan Note (Signed)
Patient has been getting sick every 1-2 weeks and had over 12+ courses of antibiotics. No prior immune evaluation. Patient is a smoker. Today's skin prick testing showed: Negative to indoor/outdoor allergens and common foods.  Keep track of infections and antibiotics use. Get bloodwork to look at immune system. Strongly encouraged smoking cessation.

## 2022-04-09 NOTE — Assessment & Plan Note (Addendum)
Perennial rhinitis symptoms for 1 year.  No prior allergy evaluation. Zyrtec ineffective.  Today's skin prick testing showed: Negative to indoor/outdoor allergens.  Get bloodwork.  Start Ryaltris (olopatadine + mometasone nasal spray combination) 1-2 sprays per nostril twice a day. Sample given. This replaces your other nasal sprays. If this works well for you, then have [facility] ship the medication to your home - prescription already sent in.  Nasal saline spray (i.e., Simply Saline) or nasal saline lavage (i.e., NeilMed) is recommended as needed and prior to medicated nasal sprays. Stop smoking.

## 2022-04-10 ENCOUNTER — Encounter: Payer: Self-pay | Admitting: Allergy

## 2022-04-10 NOTE — Telephone Encounter (Signed)
Patient called in today. She is currently talking to [place] regarding the charges.

## 2022-04-18 DIAGNOSIS — Z79899 Other long term (current) drug therapy: Secondary | ICD-10-CM | POA: Diagnosis not present

## 2022-04-18 DIAGNOSIS — L732 Hidradenitis suppurativa: Secondary | ICD-10-CM | POA: Diagnosis not present

## 2022-04-25 DIAGNOSIS — F331 Major depressive disorder, recurrent, moderate: Secondary | ICD-10-CM | POA: Diagnosis not present

## 2022-04-25 DIAGNOSIS — F411 Generalized anxiety disorder: Secondary | ICD-10-CM | POA: Diagnosis not present

## 2022-05-17 LAB — ANA W/REFLEX: Anti Nuclear Antibody (ANA): NEGATIVE

## 2022-05-23 LAB — CBC WITH DIFFERENTIAL/PLATELET
Basophils Absolute: 0 10*3/uL (ref 0.0–0.2)
Basos: 0 %
EOS (ABSOLUTE): 0.1 10*3/uL (ref 0.0–0.4)
Eos: 1 %
Hematocrit: 43.1 % (ref 34.0–46.6)
Hemoglobin: 14.1 g/dL (ref 11.1–15.9)
Immature Grans (Abs): 0.1 10*3/uL (ref 0.0–0.1)
Immature Granulocytes: 1 %
Lymphocytes Absolute: 2.2 10*3/uL (ref 0.7–3.1)
Lymphs: 19 %
MCH: 28.4 pg (ref 26.6–33.0)
MCHC: 32.7 g/dL (ref 31.5–35.7)
MCV: 87 fL (ref 79–97)
Monocytes Absolute: 0.9 10*3/uL (ref 0.1–0.9)
Monocytes: 7 %
Neutrophils Absolute: 8.7 10*3/uL — ABNORMAL HIGH (ref 1.4–7.0)
Neutrophils: 72 %
Platelets: 577 10*3/uL — ABNORMAL HIGH (ref 150–450)
RBC: 4.96 x10E6/uL (ref 3.77–5.28)
RDW: 13.1 % (ref 11.7–15.4)
WBC: 12 10*3/uL — ABNORMAL HIGH (ref 3.4–10.8)

## 2022-05-23 LAB — STREP PNEUMONIAE 23 SEROTYPES IGG
Pneumo Ab Type 1*: 0.1 ug/mL — ABNORMAL LOW (ref >1.3)
Pneumo Ab Type 12 (12F)*: 0.3 ug/mL — ABNORMAL LOW (ref >1.3)
Pneumo Ab Type 14*: 0.2 ug/mL — ABNORMAL LOW (ref >1.3)
Pneumo Ab Type 17 (17F)*: 0.1 ug/mL — ABNORMAL LOW (ref >1.3)
Pneumo Ab Type 19 (19F)*: 0.2 ug/mL — ABNORMAL LOW (ref >1.3)
Pneumo Ab Type 2*: 0.3 ug/mL — ABNORMAL LOW (ref >1.3)
Pneumo Ab Type 20*: 0.4 ug/mL — ABNORMAL LOW (ref >1.3)
Pneumo Ab Type 22 (22F)*: 0.4 ug/mL — ABNORMAL LOW (ref >1.3)
Pneumo Ab Type 23 (23F)*: 2 ug/mL (ref >1.3)
Pneumo Ab Type 26 (6B)*: 0.4 ug/mL — ABNORMAL LOW (ref >1.3)
Pneumo Ab Type 3*: 0.6 ug/mL — ABNORMAL LOW (ref >1.3)
Pneumo Ab Type 34 (10A)*: 0.3 ug/mL — ABNORMAL LOW (ref >1.3)
Pneumo Ab Type 4*: <0.1 ug/mL — ABNORMAL LOW (ref >1.3)
Pneumo Ab Type 43 (11A)*: 0.1 ug/mL — ABNORMAL LOW (ref >1.3)
Pneumo Ab Type 5*: <0.1 ug/mL — ABNORMAL LOW (ref >1.3)
Pneumo Ab Type 51 (7F)*: 0.2 ug/mL — ABNORMAL LOW (ref >1.3)
Pneumo Ab Type 54 (15B)*: 0.2 ug/mL — ABNORMAL LOW (ref >1.3)
Pneumo Ab Type 56 (18C)*: 0.1 ug/mL — ABNORMAL LOW (ref >1.3)
Pneumo Ab Type 57 (19A)*: 0.4 ug/mL — ABNORMAL LOW (ref >1.3)
Pneumo Ab Type 68 (9V)*: <0.1 ug/mL — ABNORMAL LOW (ref >1.3)
Pneumo Ab Type 70 (33F)*: 0.6 ug/mL — ABNORMAL LOW (ref >1.3)
Pneumo Ab Type 8*: 0.6 ug/mL — ABNORMAL LOW (ref >1.3)
Pneumo Ab Type 9 (9N)*: <0.1 ug/mL — ABNORMAL LOW (ref >1.3)

## 2022-05-23 LAB — ALLERGENS W/TOTAL IGE AREA 2

## 2022-05-23 LAB — IGG 1, 2, 3, AND 4
IgG (Immunoglobin G), Serum: 1323 mg/dL (ref 586–1602)
IgG, Subclass 1: 828 mg/dL — ABNORMAL HIGH (ref 248–810)
IgG, Subclass 2: 217 mg/dL (ref 130–555)
IgG, Subclass 3: 51 mg/dL (ref 15–102)
IgG, Subclass 4: 37 mg/dL (ref 2–96)

## 2022-05-23 LAB — DIPHTHERIA / TETANUS ANTIBODY PANEL
Diphtheria Ab: 0.24 IU/mL (ref <0.10)
Tetanus Ab, IgG: 0.88 IU/mL (ref <0.10)

## 2022-05-23 LAB — COMPLEMENT, TOTAL: Compl, Total (CH50): >60 U/mL (ref >41)

## 2022-05-23 LAB — IGG, IGA, IGM
IgA/Immunoglobulin A, Serum: 126 mg/dL (ref 87–352)
IgM (Immunoglobulin M), Srm: 97 mg/dL (ref 26–217)

## 2022-05-26 ENCOUNTER — Encounter: Payer: Self-pay | Admitting: Allergy

## 2022-06-21 DIAGNOSIS — R300 Dysuria: Secondary | ICD-10-CM | POA: Diagnosis not present

## 2022-06-21 DIAGNOSIS — M545 Low back pain, unspecified: Secondary | ICD-10-CM | POA: Diagnosis not present

## 2022-06-25 ENCOUNTER — Encounter: Payer: Self-pay | Admitting: Internal Medicine

## 2022-07-17 ENCOUNTER — Ambulatory Visit: Payer: 59 | Admitting: Radiology

## 2022-07-17 VITALS — BP 142/100

## 2022-07-17 DIAGNOSIS — N761 Subacute and chronic vaginitis: Secondary | ICD-10-CM

## 2022-07-17 LAB — WET PREP FOR TRICH, YEAST, CLUE

## 2022-07-17 MED ORDER — NUVESSA 1.3 % VA GEL: 5.0000 g | Freq: Once | VAGINAL | 0 refills | Status: AC

## 2022-07-17 NOTE — Progress Notes (Signed)
      Subjective: [name] is a 42 y.o. female who complains of vaginal discharge, itching, no odor. Took Diflucan #2 yesterday, #1 the day before and #1 over the weekend. Finished course of Metronidazole last Wednesday (urgent care). Symptoms x's 2-3 weeks.     Review of Systems  All other systems reviewed and are negative.   Past Medical History:  Diagnosis Date   ADHD    Allergy    Anemia, iron deficiency    Anxiety    Arthritis    CIN III (cervical intraepithelial neoplasia grade III) with severe dysplasia 11/2008   LEEP-D&C-HYSTEROSCOPY-SHOWED CIN II AND CIN III   Depression    DUB (dysfunctional uterine bleeding)    Herpes simplex type 1 infection 11/2020   Iron deficiency anemia secondary to blood loss (chronic) 07/29/2012   Kidney stones    Migraines    Recurrent upper respiratory infection (URI)    Sleep apnea    Type 2 diabetes mellitus ([facility])       Objective:  Today's Vitals   07/17/22 1211 07/17/22 1221  BP: (!) 138/96 (!) 142/100   There is no height or weight on file to calculate BMI.   -General: no acute distress -Vulva: without lesions or discharge -Vagina: discharge present, aptima swab and wet prep obtained -Cervix: no lesion or discharge, no CMT -Perineum: no lesions -Uterus: Mobile, non tender -Adnexa: no masses or tenderness  Microscopic wet-mount exam shows clue cells.   Leatrice Jewels, CMA present for exam  Assessment:/Plan:   1. Subacute vaginitis - WET PREP FOR TRICH, YEAST, CLUE - metroNIDAZOLE (NUVESSA) 1.3 % GEL; Place 5 g vaginally once for 1 dose.  Dispense: 5 g; Refill: 0     Avoid intercourse until symptoms are resolved. Safe sex encouraged. Avoid the use of soaps or perfumed products in the peri area. Avoid tub baths and sitting in sweaty or wet clothing for prolonged periods of time.

## 2022-07-22 DIAGNOSIS — N760 Acute vaginitis: Secondary | ICD-10-CM

## 2022-07-23 NOTE — Telephone Encounter (Signed)
The nuvessa may take up to 6 days to work. If no improvement by Friday we may need to retreat.

## 2022-07-23 NOTE — Telephone Encounter (Signed)
That is fine 

## 2022-07-25 ENCOUNTER — Encounter: Payer: Self-pay | Admitting: Internal Medicine

## 2022-07-25 DIAGNOSIS — F331 Major depressive disorder, recurrent, moderate: Secondary | ICD-10-CM | POA: Diagnosis not present

## 2022-07-25 DIAGNOSIS — F411 Generalized anxiety disorder: Secondary | ICD-10-CM | POA: Diagnosis not present

## 2022-07-25 MED ORDER — METRONIDAZOLE 0.75 % VA GEL: 1.0000 | Freq: Every day | VAGINAL | 0 refills | Status: AC

## 2022-07-25 NOTE — Telephone Encounter (Signed)
Ok to treat with metrogel, if symptoms persist will  need visit.

## 2022-08-01 DIAGNOSIS — F331 Major depressive disorder, recurrent, moderate: Secondary | ICD-10-CM | POA: Diagnosis not present

## 2022-08-01 DIAGNOSIS — F411 Generalized anxiety disorder: Secondary | ICD-10-CM | POA: Diagnosis not present

## 2022-08-08 DIAGNOSIS — F411 Generalized anxiety disorder: Secondary | ICD-10-CM | POA: Diagnosis not present

## 2022-08-08 DIAGNOSIS — F331 Major depressive disorder, recurrent, moderate: Secondary | ICD-10-CM | POA: Diagnosis not present

## 2022-08-22 DIAGNOSIS — F411 Generalized anxiety disorder: Secondary | ICD-10-CM | POA: Diagnosis not present

## 2022-08-22 DIAGNOSIS — F331 Major depressive disorder, recurrent, moderate: Secondary | ICD-10-CM | POA: Diagnosis not present

## 2022-08-27 ENCOUNTER — Encounter: Payer: Self-pay | Admitting: Nurse Practitioner

## 2022-08-27 ENCOUNTER — Other Ambulatory Visit: Payer: Self-pay | Admitting: Nurse Practitioner

## 2022-08-27 ENCOUNTER — Ambulatory Visit (INDEPENDENT_AMBULATORY_CARE_PROVIDER_SITE_OTHER): Payer: 59 | Admitting: Nurse Practitioner

## 2022-08-27 VITALS — BP 124/78

## 2022-08-27 DIAGNOSIS — B9689 Other specified bacterial agents as the cause of diseases classified elsewhere: Secondary | ICD-10-CM | POA: Diagnosis not present

## 2022-08-27 DIAGNOSIS — N760 Acute vaginitis: Secondary | ICD-10-CM | POA: Diagnosis not present

## 2022-08-27 DIAGNOSIS — N898 Other specified noninflammatory disorders of vagina: Secondary | ICD-10-CM | POA: Diagnosis not present

## 2022-08-27 LAB — WET PREP FOR TRICH, YEAST, CLUE

## 2022-08-27 MED ORDER — NUVESSA 1.3 % VA GEL: 1.0000 | Freq: Once | VAGINAL | 0 refills | Status: AC

## 2022-08-27 NOTE — Progress Notes (Signed)
   Acute Office Visit  Subjective:    Patient ID: [name], female    DOB: 26-Feb-1981, 42 y.o.   MRN: [id number]   HPI 42 y.o. G0 presents today for vaginal discharge, odor and mild itching x 3 days. Treated for BV 07/17/22 with Dolores Frame, felt like she had some symptoms still, so treated 07/22/22 with Metrogel. H/O recurrences and feels like they occur after intercourse. Had leftover Metrogel at home and used the last 2 nights.   No LMP recorded. (Menstrual status: IUD).    Review of Systems  Constitutional: Negative.   Genitourinary:  Positive for vaginal discharge and vaginal pain (Itching). Negative for dysuria, frequency, hematuria and urgency.       Vaginal odor       Objective:    Physical Exam Constitutional:      Appearance: Normal appearance.  Genitourinary:    General: Normal vulva.     Comments: Difficult exam due to medicinal gel obscuring discharge    BP 124/78 (BP Location: Left Arm, Patient Position: Sitting, Cuff Size: Normal)  Wt Readings from Last 3 Encounters:  04/09/22 191 lb 11.2 oz (87 kg)  03/24/22 193 lb (87.5 kg)  01/09/22 193 lb (87.5 kg)        Patient informed chaperone available to be present for breast and/or pelvic exam. Patient has requested no chaperone to be present. Patient has been advised what will be completed during breast and pelvic exam.   Wet prep + clue cells (+ odor)  Assessment & Plan:   Problem List Items Addressed This Visit   None Visit Diagnoses     Bacterial vaginosis    -  Primary   Relevant Medications   metroNIDAZOLE (NUVESSA) 1.3 % GEL   Vaginal discharge       Relevant Orders   WET PREP FOR TRICH, YEAST, CLUE   Recurrent vaginitis          Plan: Wet prep positive for clue cells - Nuvessa 1.3% gel once. Does not tolerate PO metronidazole. 1 week after completing begin boric acid suppositories twice weekly. Discussed long-term Metrogel if no improvement with boric acid.      Olivia Mackie DNP,  3:01 PM 08/27/2022

## 2022-08-28 ENCOUNTER — Other Ambulatory Visit: Payer: Self-pay | Admitting: Nurse Practitioner

## 2022-08-28 DIAGNOSIS — K644 Residual hemorrhoidal skin tags: Secondary | ICD-10-CM

## 2022-08-28 MED ORDER — LIDOCAINE-HYDROCORTISONE ACE 1-1 % EX CREA: 1.0000 | TOPICAL_CREAM | Freq: Two times a day (BID) | CUTANEOUS | 1 refills | Status: DC

## 2022-08-28 NOTE — Telephone Encounter (Signed)
Please do PA for [facility]. Has tried and failed Metrogel recently and cannot do PO Metronidazole. Thanks.

## 2022-08-28 NOTE — Telephone Encounter (Signed)
PA initiated through covermymeds for [place].   KEY:  [id number] Patient informed.  Patient also given information to text NUVESSA to [phone] (for discount card information).

## 2022-08-29 NOTE — Telephone Encounter (Signed)
See pharmacy message: Lidocaine-hydrocortisone cream  Pharmacy comment: Alternative Requested:THIS MEDICATION IS NOT AVAILABLE IN THE STRENGTH PRESCRIBED(1-1%)  PLEASE PRESCRIBE AND ALTERNATIVE.    Routing to [place], NP

## 2022-09-01 ENCOUNTER — Other Ambulatory Visit: Payer: Self-pay | Admitting: Nurse Practitioner

## 2022-09-01 DIAGNOSIS — K644 Residual hemorrhoidal skin tags: Secondary | ICD-10-CM

## 2022-09-01 DIAGNOSIS — J029 Acute pharyngitis, unspecified: Secondary | ICD-10-CM | POA: Diagnosis not present

## 2022-09-01 DIAGNOSIS — J0300 Acute streptococcal tonsillitis, unspecified: Secondary | ICD-10-CM | POA: Diagnosis not present

## 2022-09-01 MED ORDER — LIDOCAINE-HYDROCORT (PERIANAL) 3-0.5 % EX CREA: 1.0000 | TOPICAL_CREAM | Freq: Two times a day (BID) | CUTANEOUS | 0 refills | Status: AC

## 2022-09-01 NOTE — Telephone Encounter (Signed)
Alternative provided. Thanks.

## 2022-09-10 ENCOUNTER — Other Ambulatory Visit: Payer: Self-pay | Admitting: Nurse Practitioner

## 2022-09-10 DIAGNOSIS — N760 Acute vaginitis: Secondary | ICD-10-CM

## 2022-09-10 DIAGNOSIS — B9689 Other specified bacterial agents as the cause of diseases classified elsewhere: Secondary | ICD-10-CM

## 2022-09-10 MED ORDER — METRONIDAZOLE 0.75 % VA GEL: 1.0000 | VAGINAL | 0 refills | Status: AC

## 2022-09-19 DIAGNOSIS — F411 Generalized anxiety disorder: Secondary | ICD-10-CM | POA: Diagnosis not present

## 2022-09-19 DIAGNOSIS — F900 Attention-deficit hyperactivity disorder, predominantly inattentive type: Secondary | ICD-10-CM | POA: Diagnosis not present

## 2022-09-26 DIAGNOSIS — F331 Major depressive disorder, recurrent, moderate: Secondary | ICD-10-CM | POA: Diagnosis not present

## 2022-09-26 DIAGNOSIS — F411 Generalized anxiety disorder: Secondary | ICD-10-CM | POA: Diagnosis not present

## 2022-10-03 DIAGNOSIS — F331 Major depressive disorder, recurrent, moderate: Secondary | ICD-10-CM | POA: Diagnosis not present

## 2022-10-03 DIAGNOSIS — F411 Generalized anxiety disorder: Secondary | ICD-10-CM | POA: Diagnosis not present

## 2022-10-10 DIAGNOSIS — F331 Major depressive disorder, recurrent, moderate: Secondary | ICD-10-CM | POA: Diagnosis not present

## 2022-10-10 DIAGNOSIS — F411 Generalized anxiety disorder: Secondary | ICD-10-CM | POA: Diagnosis not present

## 2022-11-14 DIAGNOSIS — F331 Major depressive disorder, recurrent, moderate: Secondary | ICD-10-CM | POA: Diagnosis not present

## 2022-11-14 DIAGNOSIS — F411 Generalized anxiety disorder: Secondary | ICD-10-CM | POA: Diagnosis not present

## 2022-12-05 DIAGNOSIS — F331 Major depressive disorder, recurrent, moderate: Secondary | ICD-10-CM | POA: Diagnosis not present

## 2022-12-05 DIAGNOSIS — F411 Generalized anxiety disorder: Secondary | ICD-10-CM | POA: Diagnosis not present

## 2022-12-12 DIAGNOSIS — F331 Major depressive disorder, recurrent, moderate: Secondary | ICD-10-CM | POA: Diagnosis not present

## 2022-12-12 DIAGNOSIS — F411 Generalized anxiety disorder: Secondary | ICD-10-CM | POA: Diagnosis not present

## 2022-12-19 DIAGNOSIS — F411 Generalized anxiety disorder: Secondary | ICD-10-CM | POA: Diagnosis not present

## 2022-12-19 DIAGNOSIS — F331 Major depressive disorder, recurrent, moderate: Secondary | ICD-10-CM | POA: Diagnosis not present

## 2022-12-25 ENCOUNTER — Other Ambulatory Visit: Payer: Self-pay | Admitting: Nurse Practitioner

## 2022-12-25 DIAGNOSIS — N760 Acute vaginitis: Secondary | ICD-10-CM

## 2022-12-25 DIAGNOSIS — B9689 Other specified bacterial agents as the cause of diseases classified elsewhere: Secondary | ICD-10-CM

## 2022-12-26 DIAGNOSIS — F331 Major depressive disorder, recurrent, moderate: Secondary | ICD-10-CM | POA: Diagnosis not present

## 2022-12-26 DIAGNOSIS — F411 Generalized anxiety disorder: Secondary | ICD-10-CM | POA: Diagnosis not present

## 2022-12-26 NOTE — Telephone Encounter (Signed)
etroNIDAZOLE (METROGEL) 0.75 % vaginal gel [Tiffany A Wallace]      Patient Comment: i have been following instructions and it has been working for me greatly! The issue is the tube does not provide more than 8 doses or so if you use an entire applicator full. Also i have found that sometimes two doses a week does not always calm fown my symptoms enough. The symptoms come back within a couple days of me using the gel. I currently have symptoms which the gel had taken away but ive run out and now after two days the symptoms are back. Can you please refill? Its working!   Patient sent note about medication. Please advise.

## 2022-12-29 NOTE — Telephone Encounter (Signed)
Please make sure she is not using more than twice weekly. This twice weekly regimen is recommended for 4 months and we have completed that. I would recommend she schedule OV if she is still having recurrent infections.

## 2023-01-02 DIAGNOSIS — F411 Generalized anxiety disorder: Secondary | ICD-10-CM | POA: Diagnosis not present

## 2023-01-02 DIAGNOSIS — F410 Panic disorder [episodic paroxysmal anxiety] without agoraphobia: Secondary | ICD-10-CM | POA: Diagnosis not present

## 2023-01-02 DIAGNOSIS — F331 Major depressive disorder, recurrent, moderate: Secondary | ICD-10-CM | POA: Diagnosis not present

## 2023-01-05 ENCOUNTER — Ambulatory Visit: Payer: 59 | Admitting: Obstetrics and Gynecology

## 2023-01-05 ENCOUNTER — Encounter: Payer: Self-pay | Admitting: Obstetrics and Gynecology

## 2023-01-05 VITALS — BP 120/70 | HR 98 | Ht 64.25 in | Wt 179.0 lb

## 2023-01-05 DIAGNOSIS — N898 Other specified noninflammatory disorders of vagina: Secondary | ICD-10-CM

## 2023-01-05 DIAGNOSIS — B3732 Chronic candidiasis of vulva and vagina: Secondary | ICD-10-CM

## 2023-01-05 DIAGNOSIS — N761 Subacute and chronic vaginitis: Secondary | ICD-10-CM

## 2023-01-05 MED ORDER — FLUCONAZOLE 150 MG PO TABS: 150.0000 mg | ORAL_TABLET | Freq: Once | ORAL | 2 refills | Status: AC

## 2023-01-05 MED ORDER — BORIC ACID VAGINAL 600 MG VA SUPP: 1.0000 | Freq: Every evening | VAGINAL | 5 refills | Status: AC | PRN

## 2023-01-05 MED ORDER — METRONIDAZOLE 0.75 % VA GEL: 1.0000 | Freq: Two times a day (BID) | VAGINAL | 3 refills | Status: AC

## 2023-01-05 NOTE — Progress Notes (Signed)
Going to [place] and presents with c/o vaginal discharge Patient with recurrent BV infections. Was on 4 month treatment of metrogel and has used her last one yesterday. On antibiotics now and was treated for yeast a few days ago.  Blood pressure 120/70, pulse 98, height 5' 4.25" (1.632 m), weight 179 lb (81.2 kg), SpO2 99%.  Sve: possible BV and yeast infection, no lesions  A/p recurrent BV Diflucan and boric acid sent in.  Discussed taking boric acid each night after intercourse. If she felt she was having a BV infection to do a 7 day course of metrogel or 7 day course of boric acid. Refills for both were sent in.  Dr. Karma Greaser

## 2023-01-06 DIAGNOSIS — N898 Other specified noninflammatory disorders of vagina: Secondary | ICD-10-CM | POA: Diagnosis not present

## 2023-01-06 NOTE — Addendum Note (Signed)
Addended by: Earley Favor on: 01/06/2023 08:30 AM   Modules accepted: Orders

## 2023-01-07 ENCOUNTER — Encounter: Payer: Self-pay | Admitting: Obstetrics and Gynecology

## 2023-01-07 LAB — SURESWAB® ADVANCED VAGINITIS PLUS,TMA
C. trachomatis RNA, TMA: NOT DETECTED
CANDIDA SPECIES: NOT DETECTED
Candida glabrata: DETECTED — AB
N. gonorrhoeae RNA, TMA: NOT DETECTED
SURESWAB(R) ADV BACTERIAL VAGINOSIS(BV),TMA: POSITIVE — AB
TRICHOMONAS VAGINALIS (TV),TMA: NOT DETECTED

## 2023-01-07 NOTE — Telephone Encounter (Signed)
Spoke with patient. Patient reports dysuria, hematuria, urinary frequency and urgency. Symptoms started last Monday night (9/23). Had a previous Rx for Nitrofurantion, has taken two doses. Symptoms have improved. Denies fever/chills, flank pain, N/V.   Patient also asking for status of vaginal swab completed while in office.   Patient declines OV due to OOP cost, requesting to leave urine sample. Advised I will review with Dr. Karma Greaser and f/u with recommendations. Patient agreeable.   Dr. Karma Greaser -please advise on Sureswab and UA/Culture.

## 2023-01-09 ENCOUNTER — Other Ambulatory Visit: Payer: Self-pay | Admitting: *Deleted

## 2023-01-09 ENCOUNTER — Telehealth: Payer: Self-pay | Admitting: Obstetrics and Gynecology

## 2023-01-09 DIAGNOSIS — B379 Candidiasis, unspecified: Secondary | ICD-10-CM

## 2023-01-09 DIAGNOSIS — F331 Major depressive disorder, recurrent, moderate: Secondary | ICD-10-CM | POA: Diagnosis not present

## 2023-01-09 DIAGNOSIS — F410 Panic disorder [episodic paroxysmal anxiety] without agoraphobia: Secondary | ICD-10-CM | POA: Diagnosis not present

## 2023-01-09 DIAGNOSIS — F411 Generalized anxiety disorder: Secondary | ICD-10-CM | POA: Diagnosis not present

## 2023-01-09 MED ORDER — CLOTRIMAZOLE 1 % VA CREA: 1.0000 | TOPICAL_CREAM | Freq: Every day | VAGINAL | 0 refills | Status: DC

## 2023-01-09 NOTE — Telephone Encounter (Signed)
Dr. Karma Greaser -please review and advise on urinary symptoms. Sureswab results have been completed and reviewed/

## 2023-01-09 NOTE — Telephone Encounter (Signed)
-----   Message from Earley Favor sent at 01/08/2023  8:16 AM EDT ----- Yeast and bv seen Clotrimazole PV at bedtime x 7 days after diflucan 150mg  x1 with refills, since it is glabrata which is more resistant Please treat for bv with flagyl 500mg  po bid x 7days Dr. Karma Greaser

## 2023-01-09 NOTE — Telephone Encounter (Signed)
Leda Min, RN 01/09/2023 10:40 AM EDT Back to Top    Spoke with patient. Patient reports urinary symptoms have resolved. Patient states Rx was sent on 01/05/23 for metrogel that she has almost completed. Patient asking if she needs to switch to Flagyl or ok to complete metrogel then switch to clotrimazole. Advised I will review with provider to confirm. Advised will need to clarify instructions before sending. Pharmacy updated. Patient verbalizes understanding and is agreeable.    Dr. Karma Greaser out of office.   Please clarify refills for Diflucan.  Clotrimazole indicates contraindication with metrogel.   Please review.    Sending to Dr. Kennith Center to review and advise.

## 2023-01-09 NOTE — Telephone Encounter (Signed)
Pt called answering service requesting that clotrimazole cream be sent to [facility]. Rx sent.

## 2023-01-09 NOTE — Telephone Encounter (Signed)
Rachael Most, MD  to Me  [facility]   01/09/23 12:14 PM  Rx for diflucan was sent during sent. I would not send an additional rx diiflucan until Dr. Karma Greaser returns. Agree with clotrimazole rx. Patient can start metrogel after she completed the clotrimazole.   Clotrimazole sent. Patient notified.

## 2023-01-14 ENCOUNTER — Other Ambulatory Visit: Payer: Self-pay

## 2023-01-14 DIAGNOSIS — B379 Candidiasis, unspecified: Secondary | ICD-10-CM

## 2023-01-14 MED ORDER — CLOTRIMAZOLE 1 % VA CREA: 1.0000 | TOPICAL_CREAM | Freq: Every day | VAGINAL | 0 refills | Status: DC

## 2023-01-14 NOTE — Telephone Encounter (Signed)
Medication refill request: clotrimazole 1% vaginal gel  Last AEX:  04/01/21 Next AEX: not scheduled  Last MMG (if hormonal medication request): n/a Refill authorized: 45g pended for today

## 2023-02-27 DIAGNOSIS — F331 Major depressive disorder, recurrent, moderate: Secondary | ICD-10-CM | POA: Diagnosis not present

## 2023-02-27 DIAGNOSIS — F410 Panic disorder [episodic paroxysmal anxiety] without agoraphobia: Secondary | ICD-10-CM | POA: Diagnosis not present

## 2023-02-27 DIAGNOSIS — F411 Generalized anxiety disorder: Secondary | ICD-10-CM | POA: Diagnosis not present

## 2023-03-02 DIAGNOSIS — Z7251 High risk heterosexual behavior: Secondary | ICD-10-CM | POA: Diagnosis not present

## 2023-03-02 DIAGNOSIS — N760 Acute vaginitis: Secondary | ICD-10-CM | POA: Diagnosis not present

## 2023-03-02 DIAGNOSIS — J039 Acute tonsillitis, unspecified: Secondary | ICD-10-CM | POA: Diagnosis not present

## 2023-03-06 DIAGNOSIS — F411 Generalized anxiety disorder: Secondary | ICD-10-CM | POA: Diagnosis not present

## 2023-03-06 DIAGNOSIS — F410 Panic disorder [episodic paroxysmal anxiety] without agoraphobia: Secondary | ICD-10-CM | POA: Diagnosis not present

## 2023-03-06 DIAGNOSIS — F331 Major depressive disorder, recurrent, moderate: Secondary | ICD-10-CM | POA: Diagnosis not present

## 2023-03-20 DIAGNOSIS — F900 Attention-deficit hyperactivity disorder, predominantly inattentive type: Secondary | ICD-10-CM | POA: Diagnosis not present

## 2023-03-20 DIAGNOSIS — F411 Generalized anxiety disorder: Secondary | ICD-10-CM | POA: Diagnosis not present

## 2023-04-02 ENCOUNTER — Other Ambulatory Visit (HOSPITAL_COMMUNITY)
Admission: RE | Admit: 2023-04-02 | Discharge: 2023-04-02 | Disposition: A | Discharge status: Home / Self Care | Payer: 59 | Source: Ambulatory Visit | Attending: Family | Admitting: Family

## 2023-04-02 ENCOUNTER — Ambulatory Visit (INDEPENDENT_AMBULATORY_CARE_PROVIDER_SITE_OTHER): Payer: 59 | Admitting: Family

## 2023-04-02 ENCOUNTER — Encounter: Payer: Self-pay | Admitting: Family

## 2023-04-02 VITALS — BP 138/86 | HR 88 | Ht 64.25 in | Wt 178.8 lb

## 2023-04-02 DIAGNOSIS — N760 Acute vaginitis: Secondary | ICD-10-CM | POA: Diagnosis not present

## 2023-04-02 DIAGNOSIS — Z202 Contact with and (suspected) exposure to infections with a predominantly sexual mode of transmission: Secondary | ICD-10-CM | POA: Diagnosis not present

## 2023-04-02 LAB — CBC WITH DIFFERENTIAL/PLATELET
Basophils Absolute: 0 10*3/uL (ref 0.0–0.1)
Basophils Relative: 0.4 % (ref 0.0–3.0)
Eosinophils Absolute: 0.1 10*3/uL (ref 0.0–0.7)
Eosinophils Relative: 1.1 % (ref 0.0–5.0)
HCT: 42 % (ref 36.0–46.0)
Hemoglobin: 13.8 g/dL (ref 12.0–15.0)
Lymphocytes Relative: 23.7 % (ref 12.0–46.0)
Lymphs Abs: 2.7 10*3/uL (ref 0.7–4.0)
MCHC: 32.9 g/dL (ref 30.0–36.0)
MCV: 89 fL (ref 78.0–100.0)
Monocytes Absolute: 0.6 10*3/uL (ref 0.1–1.0)
Monocytes Relative: 5.4 % (ref 3.0–12.0)
Neutro Abs: 7.8 10*3/uL — ABNORMAL HIGH (ref 1.4–7.7)
Neutrophils Relative %: 69.4 % (ref 43.0–77.0)
Platelets: 507 10*3/uL — ABNORMAL HIGH (ref 150.0–400.0)
RBC: 4.73 Mil/uL (ref 3.87–5.11)
RDW: 13.7 % (ref 11.5–15.5)
WBC: 11.2 10*3/uL — ABNORMAL HIGH (ref 4.0–10.5)

## 2023-04-02 LAB — COMPREHENSIVE METABOLIC PANEL
ALT: 15 U/L (ref 0–35)
AST: 14 U/L (ref 0–37)
Albumin: 4.8 g/dL (ref 3.5–5.2)
Alkaline Phosphatase: 58 U/L (ref 39–117)
BUN: 11 mg/dL (ref 6–23)
CO2: 30 meq/L (ref 19–32)
Calcium: 10 mg/dL (ref 8.4–10.5)
Chloride: 101 meq/L (ref 96–112)
Creatinine, Ser: 0.71 mg/dL (ref 0.40–1.20)
GFR: 106.03 mL/min (ref >60.00)
Glucose, Bld: 110 mg/dL — ABNORMAL HIGH (ref 70–99)
Potassium: 4.5 meq/L (ref 3.5–5.1)
Sodium: 139 meq/L (ref 135–145)
Total Bilirubin: 0.3 mg/dL (ref 0.2–1.2)
Total Protein: 7.6 g/dL (ref 6.0–8.3)

## 2023-04-02 LAB — HEMOGLOBIN A1C: Hgb A1c MFr Bld: 6.8 % — ABNORMAL HIGH (ref 4.6–6.5)

## 2023-04-02 MED ORDER — FLUCONAZOLE 100 MG PO TABS: 100.0000 mg | ORAL_TABLET | Freq: Every day | ORAL | 0 refills | Status: DC

## 2023-04-02 NOTE — Progress Notes (Signed)
[name] is a 42 y.o. female with the following history as recorded in EpicCare:  Patient Active Problem List   Diagnosis Date Noted   Recurrent infections 04/09/2022   Mild intermittent asthma without complication 04/09/2022   Chronic rhinitis 04/09/2022   Gastroesophageal reflux disease 04/09/2022   Family history of autoimmune disorder 04/09/2022   Elevated blood pressure reading 04/09/2022   Pain in left foot 10/30/2021   Diabetes mellitus without complication (HCC) 08/05/2021   Annual physical exam 08/05/2021   PCP notes >>>>>> 03/26/2021   Hydradenitis 03/26/2021   Herpes simplex type 1 infection 03/25/2021   ADHD 03/19/2021   Recurrent boils 02/04/2018   History of kidney stones 12/17/2017   Labial cyst 09/04/2017   Observed sleep apnea 09/04/2017   Depression     Current Outpatient Medications  Medication Sig Dispense Refill   amphetamine-dextroamphetamine (ADDERALL) 20 MG tablet Take 20 mg by mouth 3 (three) times daily.      Boric Acid Vaginal 600 MG SUPP Place 1 ampule vaginally at bedtime as needed. Use after intercourse.  Can also use for 7 days if she feels a BV infection 60 suppository 5   clonazePAM (KLONOPIN) 0.5 MG tablet Take 0.5 mg by mouth at bedtime as needed for anxiety.     cyclobenzaprine (FLEXERIL) 10 MG tablet SMARTSIG:0.5-1 Tablet(s) By Mouth Every Evening     fluconazole (DIFLUCAN) 100 MG tablet Take 1 tablet (100 mg total) by mouth daily. 5 tablet 0   levonorgestrel (MIRENA) 20 MCG/24HR IUD 1 each by Intrauterine route once.     valACYclovir (VALTREX) 1000 MG tablet TAKE 1 TABLET(1000 MG) BY MOUTH TWICE DAILY 30 tablet 0   clotrimazole (CLOTRIMAZOLE-7) 1 % vaginal cream Place 1 Applicatorful vaginally at bedtime. (Patient not taking: Reported on 04/02/2023) 45 g 0   No current facility-administered medications for this visit.    Allergies: Ciprofloxacin, Clindamycin/lincomycin, Doxycycline, Metronidazole, Other, Penicillins, and Sulfa  antibiotics  Past Medical History:  Diagnosis Date   ADHD    Allergy    Anemia, iron deficiency    Anxiety    Arthritis    CIN III (cervical intraepithelial neoplasia grade III) with severe dysplasia 11/2008   LEEP-D&C-HYSTEROSCOPY-SHOWED CIN II AND CIN III   Depression    DUB (dysfunctional uterine bleeding)    Herpes simplex type 1 infection 11/2020   Iron deficiency anemia secondary to blood loss (chronic) 07/29/2012   Kidney stones    Migraines    Recurrent upper respiratory infection (URI)    Sleep apnea    Type 2 diabetes mellitus (HCC)     Past Surgical History:  Procedure Laterality Date   ANTERIOR CRUCIATE LIGAMENT REPAIR  1998   LEFT   CERVICAL BIOPSY  W/ LOOP ELECTRODE EXCISION  11/2008   CIN 3 free margins   COLPOSCOPY     Cyst removed from groin     INTRAUTERINE DEVICE INSERTION  03/09/2017   Mirena   KNEE ARTHROCENTESIS  2006   MOUTH SURGERY     PILONIDAL CYST EXCISION  1999    Family History  Problem Relation Age of Onset   Lupus Mother    Cancer Father        Throid cancer   Diabetes Father    Hypertension Father    Hyperlipidemia Father    Heart disease Paternal Grandfather    Cancer Paternal Grandfather        Prostate   Breast cancer Neg Hx    Colon cancer Neg Hx  Social History   Tobacco Use   Smoking status: Every Day    Current packs/day: 0.50    Types: Cigarettes   Smokeless tobacco: Never   Tobacco comments:    Started smoking ~ 42 y/o  Substance Use Topics   Alcohol use: Not Currently    Subjective:   Requesting STD screen today; has been struggling with recurrent yeast and BV infections; has also recently found out that was exposed to chlamydia;  History of Type 2 Diabetes- never took medication- last saw her PCP in 2023;   Objective:  Vitals:   04/02/23 1027  BP: 138/86  Pulse: 88  SpO2: 99%  Weight: 178 lb 12.8 oz (81.1 kg)  Height: 5' 4.25" (1.632 m)    General: Well developed, well nourished, in no acute  distress  Skin : Warm and dry.  Head: Normocephalic and atraumatic  Lungs: Respirations unlabored;  Neurologic: Alert and oriented; speech intact; face symmetrical; moves all extremities well; CNII-XII intact without focal deficit   Assessment:  1. Recurrent vaginitis   2. STD exposure     Plan:  Will check urine culture today; check vaginal swab for STD screen/BV/ yeast; will also check CBC, CMP, hgba1c today- ? If uncontrolled sugars are cause of recurrent infections;  She is encouraged to schedule a CPE with her PCP as she has not seen him since 2023.   Return for CPE with Dr. Drue Novel, CPE, follow up.  Orders Placed This Encounter  Procedures   Urine Culture   CBC with Differential/Platelet   Comp Met (CMET)   Hemoglobin A1c   HIV antibody (with reflex)    Requested Prescriptions   Signed Prescriptions Disp Refills   fluconazole (DIFLUCAN) 100 MG tablet 5 tablet 0    Sig: Take 1 tablet (100 mg total) by mouth daily.    /

## 2023-04-03 ENCOUNTER — Other Ambulatory Visit: Payer: Self-pay | Admitting: Family

## 2023-04-03 ENCOUNTER — Telehealth: Payer: Self-pay

## 2023-04-03 ENCOUNTER — Encounter: Payer: Self-pay | Admitting: Family

## 2023-04-03 DIAGNOSIS — F410 Panic disorder [episodic paroxysmal anxiety] without agoraphobia: Secondary | ICD-10-CM | POA: Diagnosis not present

## 2023-04-03 DIAGNOSIS — F331 Major depressive disorder, recurrent, moderate: Secondary | ICD-10-CM | POA: Diagnosis not present

## 2023-04-03 DIAGNOSIS — F411 Generalized anxiety disorder: Secondary | ICD-10-CM | POA: Diagnosis not present

## 2023-04-03 LAB — URINE CULTURE
MICRO NUMBER:: 15871658
Result:: NO GROWTH
SPECIMEN QUALITY:: ADEQUATE

## 2023-04-03 LAB — CERVICOVAGINAL ANCILLARY ONLY
Bacterial Vaginitis (gardnerella): POSITIVE — AB
Candida Glabrata: NEGATIVE
Candida Vaginitis: NEGATIVE
Chlamydia: NEGATIVE
Comment: NEGATIVE
Comment: NEGATIVE
Comment: NEGATIVE
Comment: NEGATIVE
Comment: NEGATIVE
Comment: NORMAL
Neisseria Gonorrhea: NEGATIVE
Trichomonas: NEGATIVE

## 2023-04-03 LAB — HIV ANTIBODY (ROUTINE TESTING W REFLEX): HIV 1&2 Ab, 4th Generation: NONREACTIVE

## 2023-04-03 MED ORDER — METRONIDAZOLE 0.75 % VA GEL: 1.0000 | Freq: Every day | VAGINAL | 0 refills | Status: DC

## 2023-04-03 MED ORDER — METRONIDAZOLE 0.75 % VA GEL: 1.0000 | Freq: Every day | VAGINAL | 0 refills | Status: AC

## 2023-04-03 NOTE — Telephone Encounter (Signed)
Pt has reviewed results and PCP comment via Mychart.  

## 2023-04-03 NOTE — Telephone Encounter (Signed)
Copied from CRM [id number]. Topic: Clinical - Medication Question >> Apr 03, 2023 10:48 AM Prudencio Pair wrote: Reason for CRM: Patient called stating that she was seen Dr. Dayton Scrape yesterday & stated she provided a urine & swab samples. Patient states she is worried about something in particular that was not discussed yesterday and wants to know if the samples have not been tested yet, can they test also for BV. Patient states she is worried because today she's have symptoms that are indicative of BV. Patient admitted that it was not her primary concern yesterday but today she's having symptoms of it. Wants to know if the samples will be able to be tested for BV? Patient states her symptoms are uncomfortable and if it does or does not show, can she have something prescribed for BV? Please give patient a call back at [phone].

## 2023-04-03 NOTE — Telephone Encounter (Signed)
Called pt and left a VM asking pt to call the office back.

## 2023-04-06 ENCOUNTER — Encounter: Payer: Self-pay | Admitting: Family

## 2023-04-26 IMAGING — US US THYROID
1 series · 14 of 24 positions shown · non-contrast
Comparison: None.

CLINICAL DATA: Neck mass

EXAM:
ULTRASOUND OF HEAD/NECK SOFT TISSUES
TECHNIQUE: Ultrasound examination of the head and neck soft tissues was
performed in the area of clinical concern.

[Series 1: us thyroid · 24 acquisitions, 14 frames shown]
[im 1/24]
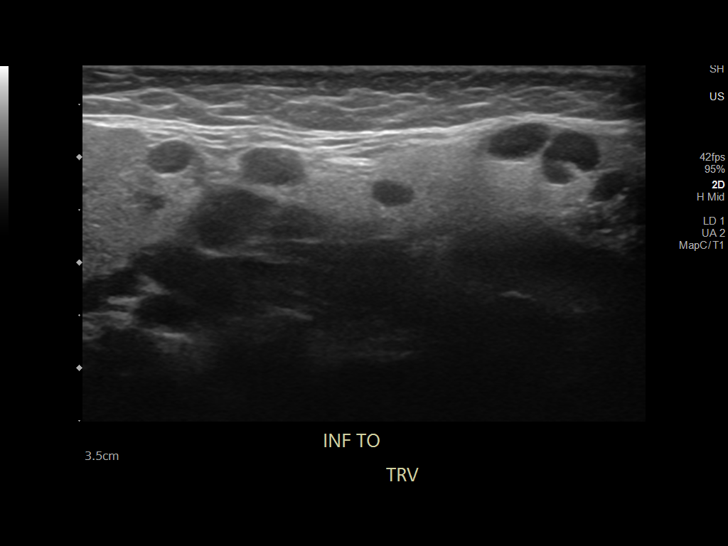
[im 3/24]
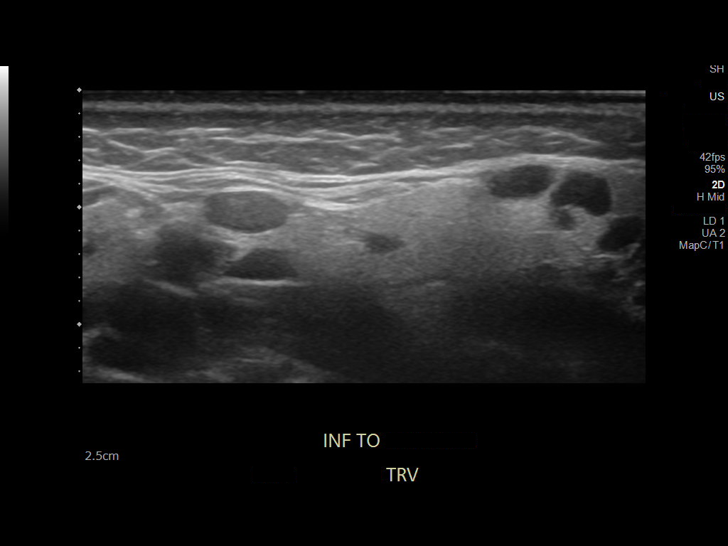
[im 5/24]
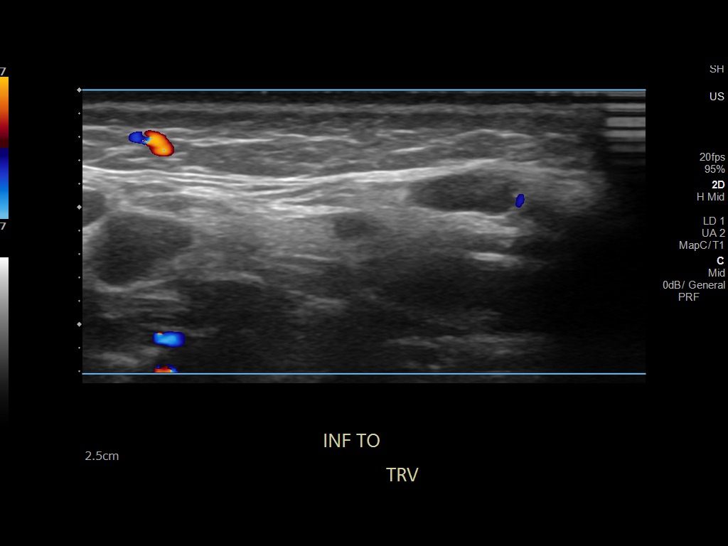
[im 7/24]
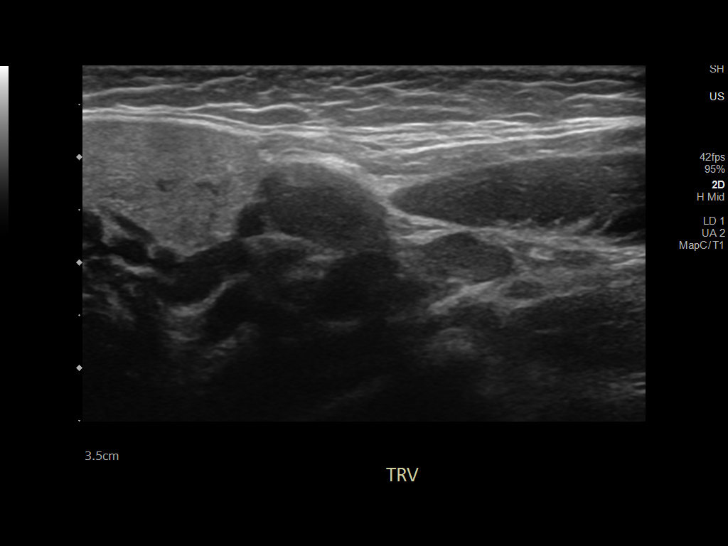
[im 8/24]
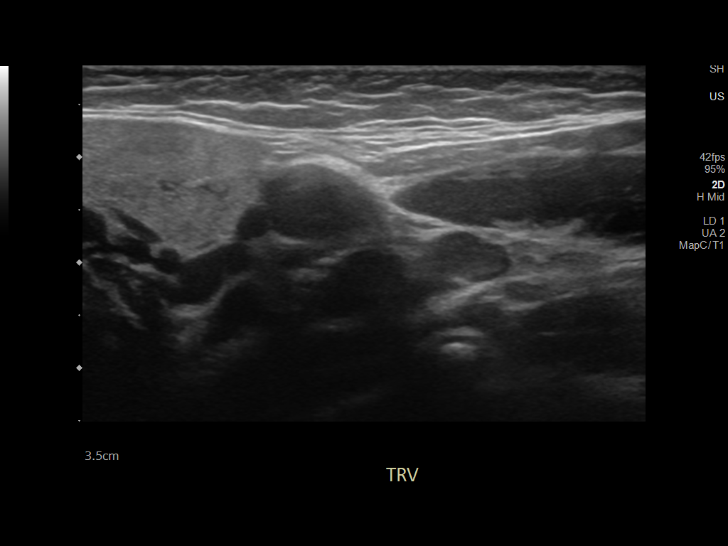
[im 10/24]
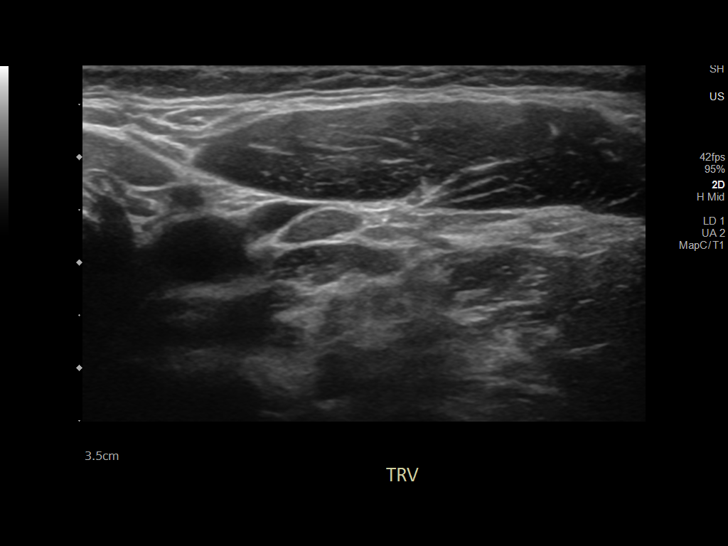
[im 12/24]
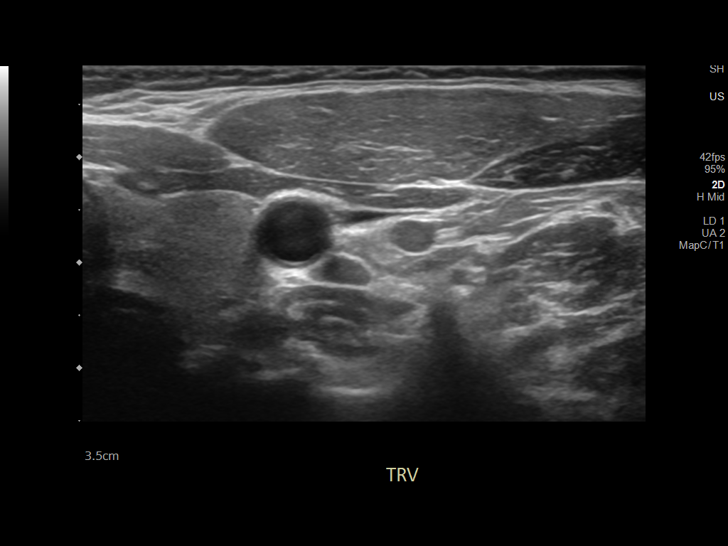
[im 13/24]
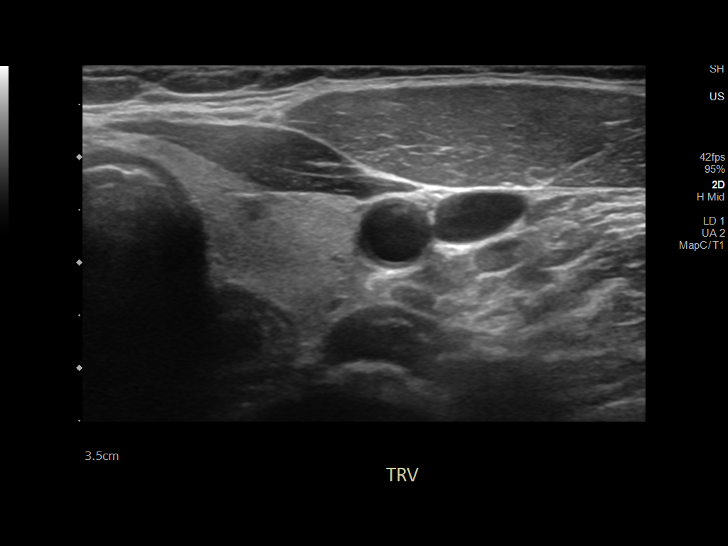
[im 15/24]
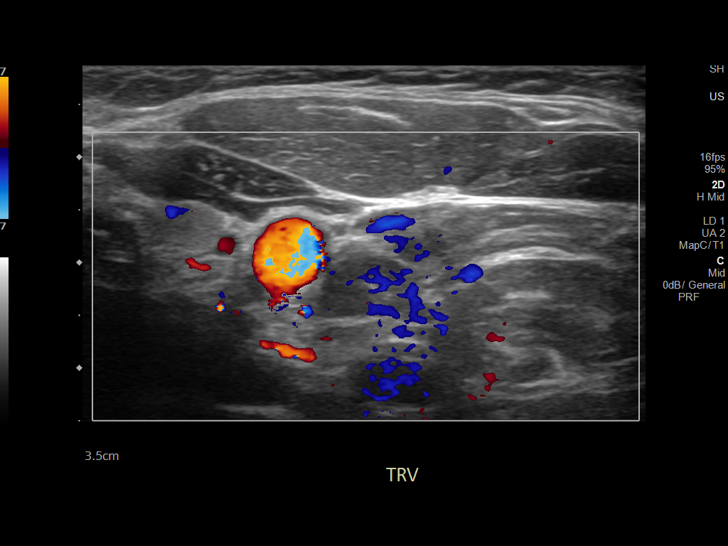
[im 17/24]
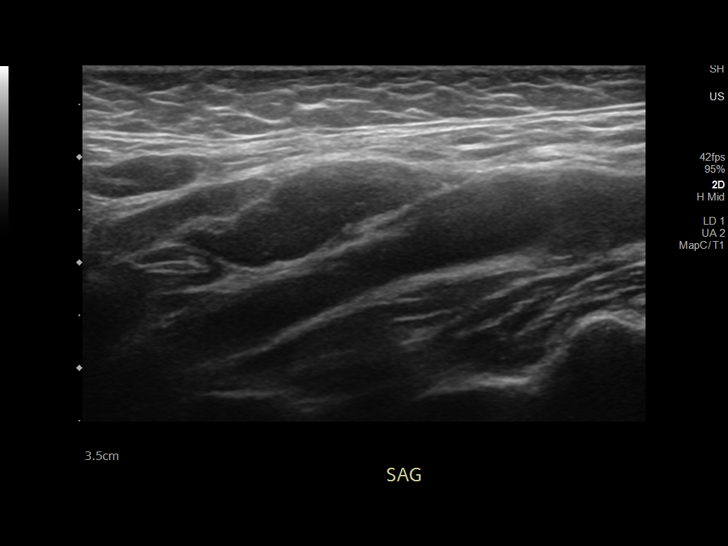
[im 19/24]
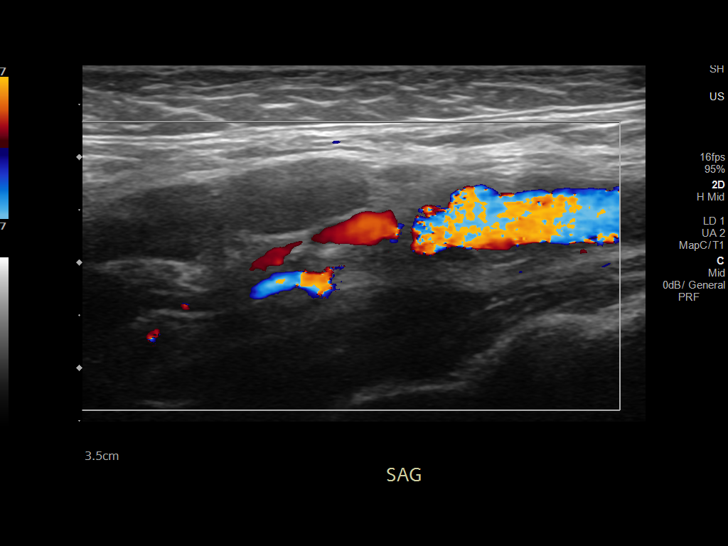
[im 20/24]
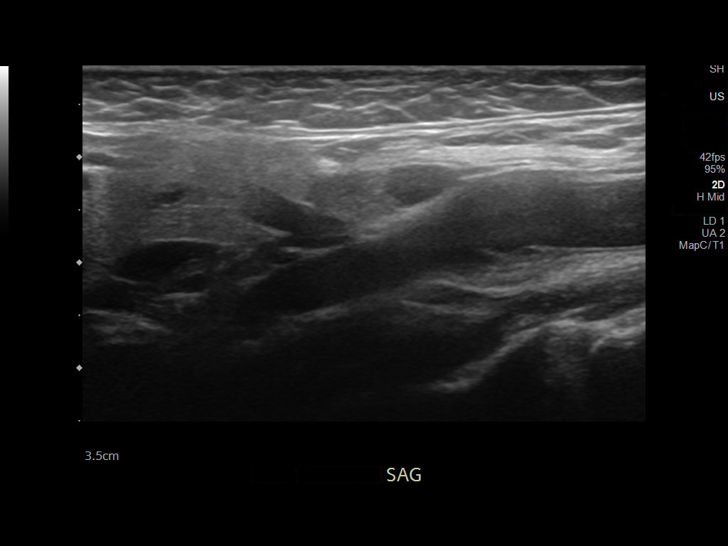
[im 22/24]
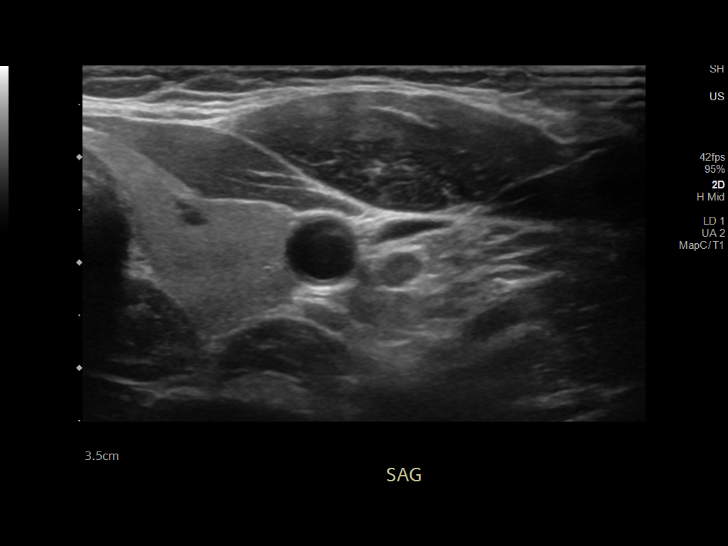
[im 24/24]
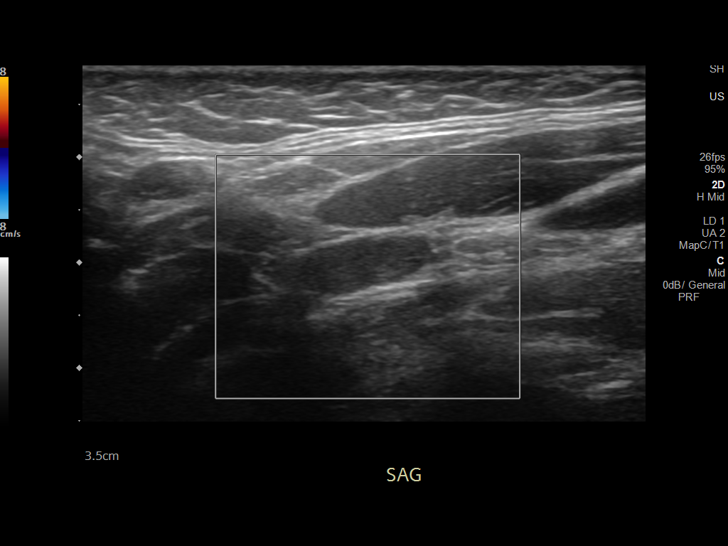

[14 of 24 positions shown; findings below may reference images not displayed]

FINDINGS: In the area of concern on the left, there are multiple visible lymph
nodes extending throughout the left cervical nodal chain. The
largest short axis dimension measures 7 mm, but the nodes do appear
notable from the standpoint of multiplicity. Multiple slightly
prominent lymph nodes in a young person are usually an indication of
reactive nodal prominence rather than neoplastic enlargement.
Continued clinical follow-up is warranted to resolution. If there is
persistence or progression, CT scan of the neck with contrast is
suggested.
IMPRESSION: Mild nodal prominence on the left, without definite pathologic short
axis enlargement. In a young person with multiple slightly prominent
nodes, this usually indicates reactive nodal prominence. See above
discussion.

## 2023-05-15 DIAGNOSIS — F331 Major depressive disorder, recurrent, moderate: Secondary | ICD-10-CM | POA: Diagnosis not present

## 2023-05-15 DIAGNOSIS — F410 Panic disorder [episodic paroxysmal anxiety] without agoraphobia: Secondary | ICD-10-CM | POA: Diagnosis not present

## 2023-05-15 DIAGNOSIS — F411 Generalized anxiety disorder: Secondary | ICD-10-CM | POA: Diagnosis not present

## 2023-05-22 DIAGNOSIS — F411 Generalized anxiety disorder: Secondary | ICD-10-CM | POA: Diagnosis not present

## 2023-05-22 DIAGNOSIS — F410 Panic disorder [episodic paroxysmal anxiety] without agoraphobia: Secondary | ICD-10-CM | POA: Diagnosis not present

## 2023-05-22 DIAGNOSIS — F331 Major depressive disorder, recurrent, moderate: Secondary | ICD-10-CM | POA: Diagnosis not present

## 2023-05-25 ENCOUNTER — Ambulatory Visit: Payer: 59 | Admitting: Nurse Practitioner

## 2023-05-26 ENCOUNTER — Encounter: Payer: Self-pay | Admitting: Obstetrics and Gynecology

## 2023-05-26 ENCOUNTER — Ambulatory Visit (INDEPENDENT_AMBULATORY_CARE_PROVIDER_SITE_OTHER): Payer: 59 | Admitting: Obstetrics and Gynecology

## 2023-05-26 VITALS — BP 136/78 | HR 93 | Temp 98.1°F | Wt 177.0 lb

## 2023-05-26 DIAGNOSIS — K644 Residual hemorrhoidal skin tags: Secondary | ICD-10-CM | POA: Diagnosis not present

## 2023-05-26 DIAGNOSIS — Z113 Encounter for screening for infections with a predominantly sexual mode of transmission: Secondary | ICD-10-CM

## 2023-05-26 DIAGNOSIS — N898 Other specified noninflammatory disorders of vagina: Secondary | ICD-10-CM

## 2023-05-26 DIAGNOSIS — N760 Acute vaginitis: Secondary | ICD-10-CM

## 2023-05-26 DIAGNOSIS — R319 Hematuria, unspecified: Secondary | ICD-10-CM

## 2023-05-26 LAB — WET PREP FOR TRICH, YEAST, CLUE

## 2023-05-26 MED ORDER — FLUCONAZOLE 150 MG PO TABS
150.0000 mg | ORAL_TABLET | ORAL | 0 refills | Status: AC
Start: 2023-05-26 — End: 2023-06-02

## 2023-05-26 MED ORDER — METRONIDAZOLE 0.75 % VA GEL: 1.0000 | VAGINAL | 1 refills | Status: AC

## 2023-05-26 NOTE — Progress Notes (Signed)
43 y.o. G0P0 female with type 2 diabetes, tobacco use, Mirena IUD here for recurrent vaginitis. Polygamous/open relationship.  No LMP recorded. (Menstrual status: IUD).   Reports history of BV and reoccurring yeast infections. Has been seen my several providers for this problem. Cramping, nausea, vaginal discharge. Has taken metro gel and diflucan weekly to help with symptoms, most recently provided by urgent care. Noticed with using the metro gel, she had brownish discharge.  Last used metrogel on Sunday, no current sx today. Get most relief with metrogel. Has not tried boric acid regularly. Desires STD screening. Request referral for removal of hemorrhoids.  04/02/2023 positive BV 01/06/2023 positive Candida glabrata and BV 08/27/2022 positive BV 07/17/2022 positive BV  Sexually active: yes with multiple partners, both female and female  \ OB History  Gravida Para Term Preterm AB Living  0       SAB IAB Ectopic Multiple Live Births          Past Medical History:  Diagnosis Date   ADHD    Allergy    Anemia, iron deficiency    Anxiety    Arthritis    CIN III (cervical intraepithelial neoplasia grade III) with severe dysplasia 11/2008   LEEP-D&C-HYSTEROSCOPY-SHOWED CIN II AND CIN III   Depression    DUB (dysfunctional uterine bleeding)    Herpes simplex type 1 infection 11/2020   Iron deficiency anemia secondary to blood loss (chronic) 07/29/2012   Kidney stones    Migraines    Recurrent upper respiratory infection (URI)    Sleep apnea    Type 2 diabetes mellitus (HCC)     Past Surgical History:  Procedure Laterality Date   ANTERIOR CRUCIATE LIGAMENT REPAIR  1998   LEFT   CERVICAL BIOPSY  W/ LOOP ELECTRODE EXCISION  11/2008   CIN 3 free margins   COLPOSCOPY     Cyst removed from groin     INTRAUTERINE DEVICE INSERTION  03/09/2017   Mirena   KNEE ARTHROCENTESIS  2006   MOUTH SURGERY     PILONIDAL CYST EXCISION  1999    Current Outpatient Medications on File  Prior to Visit  Medication Sig Dispense Refill   amphetamine-dextroamphetamine (ADDERALL) 20 MG tablet Take 20 mg by mouth 3 (three) times daily.      Boric Acid Vaginal 600 MG SUPP Place 1 ampule vaginally at bedtime as needed. Use after intercourse.  Can also use for 7 days if she feels a BV infection 60 suppository 5   clonazePAM (KLONOPIN) 0.5 MG tablet Take 0.5 mg by mouth at bedtime as needed for anxiety.     cyclobenzaprine (FLEXERIL) 10 MG tablet SMARTSIG:0.5-1 Tablet(s) By Mouth Every Evening     levonorgestrel (MIRENA) 20 MCG/24HR IUD 1 each by Intrauterine route once.     metroNIDAZOLE (METROGEL) 0.75 % vaginal gel PLACE 1 APPLICATORFUL VAGINALLY 2 (TWO) TIMES DAILY FOR 7 DAYS.     valACYclovir (VALTREX) 1000 MG tablet TAKE 1 TABLET(1000 MG) BY MOUTH TWICE DAILY 30 tablet 0   No current facility-administered medications on file prior to visit.    Allergies  Allergen Reactions   Ciprofloxacin Hives   Clindamycin/Lincomycin Diarrhea   Doxycycline Nausea And Vomiting   Metronidazole Nausea And Vomiting   Other     Mycin drugs-Nausea   Penicillins Nausea And Vomiting   Sulfa Antibiotics Hives    Mouth ulcers      PE Today's Vitals   05/26/23 1443  BP: 136/78  Pulse: 93  Temp:  98.1 F (36.7 C)  TempSrc: Oral  SpO2: 98%  Weight: 177 lb (80.3 kg)   Body mass index is 30.15 kg/m.  Physical Exam Vitals reviewed. Exam conducted with a chaperone present.  Constitutional:      General: She is not in acute distress.    Appearance: Normal appearance.  HENT:     Head: Normocephalic and atraumatic.     Nose: Nose normal.  Eyes:     Extraocular Movements: Extraocular movements intact.     Conjunctiva/sclera: Conjunctivae normal.  Pulmonary:     Effort: Pulmonary effort is normal.  Genitourinary:    General: Normal vulva.     Exam position: Lithotomy position.     Vagina: Normal. No vaginal discharge.     Cervix: Normal. No cervical motion tenderness, discharge or  lesion.     Uterus: Normal. Not enlarged and not tender.      Adnexa: Right adnexa normal and left adnexa normal.  Musculoskeletal:        General: Normal range of motion.     Cervical back: Normal range of motion.  Neurological:     General: No focal deficit present.     Mental Status: She is alert.  Psychiatric:        Mood and Affect: Mood normal.        Behavior: Behavior normal.      Assessment and Plan:        Vaginal irritation Recurrent vaginitis -     WET PREP FOR TRICH, YEAST, CLUE Wet prep is negative for BV today however patient has been diagnosed with for BV infections over the past year in our clinic alone.  This suggest a chronic bacterial vaginosis.  We discussed option for suppression with MetroGel and patient would like to start suppressive therapy.  I will also send in Diflucan for as needed treatment as patient reports occasional yeast infection after MetroGel use.  Reviewed the importance of safe sex and use of condoms to prevent recurrent BV.  Also recommended a low-carb diet given recent diagnosis of type 2 diabetes.  Patient has follow-up with her PCP in April of this year to discuss management.  Also recommend smoking cessation.  Meds ordered this encounter  Medications   fluconazole (DIFLUCAN) 150 MG tablet    Sig: Take 1 tablet (150 mg total) by mouth every 3 (three) days for 3 doses.    Dispense:  3 tablet    Refill:  0   metroNIDAZOLE (METROGEL) 0.75 % vaginal gel    Sig: Place 1 Applicatorful vaginally 2 (two) times a week for 52 doses. 6 month treatment.    Dispense:  260 g    Refill:  1     Hematuria, unspecified type -     Urinalysis,Complete w/RFL Culture F/u urology, previously referred Encouraged smoking cessation  External hemorrhoids -     Ambulatory referral to Colorectal Surgery  Screen for STD (sexually transmitted disease) -     SureSwab Advanced Vaginitis Plus,TMA  Other orders -     Urine Culture -     REFLEXIVE URINE  CULTURE    Rosalyn Gess, MD

## 2023-05-26 NOTE — Patient Instructions (Signed)
Avoid scented soaps, lotions, and menstrual products, perfumes. Also avoid douching and applying any products directly into the vagina.   Consider use of pH balanced water lubricants, like Good Clean Love of Ah! Yes.

## 2023-05-27 LAB — SURESWAB® ADVANCED VAGINITIS PLUS,TMA
C. trachomatis RNA, TMA: NOT DETECTED
CANDIDA SPECIES: NOT DETECTED
Candida glabrata: NOT DETECTED
N. gonorrhoeae RNA, TMA: NOT DETECTED
SURESWAB(R) ADV BACTERIAL VAGINOSIS(BV),TMA: NEGATIVE
TRICHOMONAS VAGINALIS (TV),TMA: NOT DETECTED

## 2023-05-28 ENCOUNTER — Encounter: Payer: Self-pay | Admitting: Obstetrics and Gynecology

## 2023-05-28 DIAGNOSIS — B3731 Acute candidiasis of vulva and vagina: Secondary | ICD-10-CM

## 2023-05-28 LAB — URINE CULTURE
MICRO NUMBER:: 16069385
Result:: NO GROWTH
SPECIMEN QUALITY:: ADEQUATE

## 2023-05-28 LAB — URINALYSIS, COMPLETE W/RFL CULTURE
Bacteria, UA: NONE SEEN /HPF
Bilirubin Urine: NEGATIVE
Glucose, UA: NEGATIVE
Hyaline Cast: NONE SEEN /LPF
Ketones, ur: NEGATIVE
Leukocyte Esterase: NEGATIVE
Nitrites, Initial: NEGATIVE
Protein, ur: NEGATIVE
Specific Gravity, Urine: 1.025 (ref 1.001–1.035)
pH: 6 (ref 5.0–8.0)

## 2023-05-28 LAB — CULTURE INDICATED

## 2023-05-29 DIAGNOSIS — F331 Major depressive disorder, recurrent, moderate: Secondary | ICD-10-CM | POA: Diagnosis not present

## 2023-05-29 DIAGNOSIS — F411 Generalized anxiety disorder: Secondary | ICD-10-CM | POA: Diagnosis not present

## 2023-05-29 DIAGNOSIS — F410 Panic disorder [episodic paroxysmal anxiety] without agoraphobia: Secondary | ICD-10-CM | POA: Diagnosis not present

## 2023-06-05 DIAGNOSIS — F410 Panic disorder [episodic paroxysmal anxiety] without agoraphobia: Secondary | ICD-10-CM | POA: Diagnosis not present

## 2023-06-05 DIAGNOSIS — F411 Generalized anxiety disorder: Secondary | ICD-10-CM | POA: Diagnosis not present

## 2023-06-05 DIAGNOSIS — F331 Major depressive disorder, recurrent, moderate: Secondary | ICD-10-CM | POA: Diagnosis not present

## 2023-06-12 DIAGNOSIS — F411 Generalized anxiety disorder: Secondary | ICD-10-CM | POA: Diagnosis not present

## 2023-06-12 DIAGNOSIS — F410 Panic disorder [episodic paroxysmal anxiety] without agoraphobia: Secondary | ICD-10-CM | POA: Diagnosis not present

## 2023-06-12 DIAGNOSIS — F331 Major depressive disorder, recurrent, moderate: Secondary | ICD-10-CM | POA: Diagnosis not present

## 2023-06-16 MED ORDER — FLUCONAZOLE 150 MG PO TABS: 150.0000 mg | ORAL_TABLET | Freq: Once | ORAL | 2 refills | Status: AC

## 2023-06-19 DIAGNOSIS — F410 Panic disorder [episodic paroxysmal anxiety] without agoraphobia: Secondary | ICD-10-CM | POA: Diagnosis not present

## 2023-06-19 DIAGNOSIS — F411 Generalized anxiety disorder: Secondary | ICD-10-CM | POA: Diagnosis not present

## 2023-06-19 DIAGNOSIS — F331 Major depressive disorder, recurrent, moderate: Secondary | ICD-10-CM | POA: Diagnosis not present

## 2023-06-26 DIAGNOSIS — F411 Generalized anxiety disorder: Secondary | ICD-10-CM | POA: Diagnosis not present

## 2023-06-26 DIAGNOSIS — F331 Major depressive disorder, recurrent, moderate: Secondary | ICD-10-CM | POA: Diagnosis not present

## 2023-06-26 DIAGNOSIS — F410 Panic disorder [episodic paroxysmal anxiety] without agoraphobia: Secondary | ICD-10-CM | POA: Diagnosis not present

## 2023-07-03 DIAGNOSIS — F410 Panic disorder [episodic paroxysmal anxiety] without agoraphobia: Secondary | ICD-10-CM | POA: Diagnosis not present

## 2023-07-03 DIAGNOSIS — F331 Major depressive disorder, recurrent, moderate: Secondary | ICD-10-CM | POA: Diagnosis not present

## 2023-07-03 DIAGNOSIS — F411 Generalized anxiety disorder: Secondary | ICD-10-CM | POA: Diagnosis not present

## 2023-07-06 DIAGNOSIS — K625 Hemorrhage of anus and rectum: Secondary | ICD-10-CM | POA: Diagnosis not present

## 2023-07-06 DIAGNOSIS — K644 Residual hemorrhoidal skin tags: Secondary | ICD-10-CM | POA: Diagnosis not present

## 2023-07-17 ENCOUNTER — Encounter: Payer: 59 | Admitting: Internal Medicine

## 2023-08-07 DIAGNOSIS — F410 Panic disorder [episodic paroxysmal anxiety] without agoraphobia: Secondary | ICD-10-CM | POA: Diagnosis not present

## 2023-08-07 DIAGNOSIS — F411 Generalized anxiety disorder: Secondary | ICD-10-CM | POA: Diagnosis not present

## 2023-08-07 DIAGNOSIS — F331 Major depressive disorder, recurrent, moderate: Secondary | ICD-10-CM | POA: Diagnosis not present

## 2023-08-14 ENCOUNTER — Encounter: Payer: Self-pay | Admitting: Internal Medicine

## 2023-08-14 ENCOUNTER — Ambulatory Visit (INDEPENDENT_AMBULATORY_CARE_PROVIDER_SITE_OTHER): Admitting: Internal Medicine

## 2023-08-14 VITALS — BP 134/84 | HR 102 | Temp 97.7°F | Resp 16 | Ht 64.25 in | Wt 175.2 lb

## 2023-08-14 DIAGNOSIS — Z0001 Encounter for general adult medical examination with abnormal findings: Secondary | ICD-10-CM

## 2023-08-14 DIAGNOSIS — Z1231 Encounter for screening mammogram for malignant neoplasm of breast: Secondary | ICD-10-CM

## 2023-08-14 DIAGNOSIS — F410 Panic disorder [episodic paroxysmal anxiety] without agoraphobia: Secondary | ICD-10-CM | POA: Diagnosis not present

## 2023-08-14 DIAGNOSIS — D75839 Thrombocytosis, unspecified: Secondary | ICD-10-CM

## 2023-08-14 DIAGNOSIS — G4730 Sleep apnea, unspecified: Secondary | ICD-10-CM | POA: Diagnosis not present

## 2023-08-14 DIAGNOSIS — E119 Type 2 diabetes mellitus without complications: Secondary | ICD-10-CM

## 2023-08-14 DIAGNOSIS — F331 Major depressive disorder, recurrent, moderate: Secondary | ICD-10-CM | POA: Diagnosis not present

## 2023-08-14 DIAGNOSIS — F411 Generalized anxiety disorder: Secondary | ICD-10-CM | POA: Diagnosis not present

## 2023-08-14 DIAGNOSIS — Z0000 Encounter for general adult medical examination without abnormal findings: Secondary | ICD-10-CM

## 2023-08-14 NOTE — Patient Instructions (Addendum)
 INSTRUCTIONS  FOR TODAY  Continue following a healthy diet. Good information is found that the American diabetes Association website  Try to exercise 3 hours a week  Vaccines you need to consider Pneumonia shot Flu shot every fall COVID booster Will get a mammogram  GO TO THE LAB : Get the blood work     Next office visit for a checkup in 4 months Please make an appointment before you leave today Depending on your blood or XRs results it might be necessary to come back sooner       Per our records you are due for your diabetic eye exam. Please contact your eye doctor to schedule an appointment. Please have them send copies of your office visit notes to us . Our fax number is [phone]. If you need a referral to an eye doctor please let us  know.

## 2023-08-14 NOTE — Progress Notes (Unsigned)
 Subjective:    Patient ID: [name], female    DOB: [birth year], 43 y.o.   MRN: [id number]  DOS:  08/14/2023 Type of visit - description: CPX  Here for CPX.  Chronic medical problems addressed. Feeling well. Has change her diet, lost some weight. Several days ago, felt a small lump at the right breast, it disappeared within 36 hours, now self breast exam negative.  Review of Systems See above   Past Medical History:  Diagnosis Date   ADHD    Allergy     Anemia, iron deficiency    Anxiety    Arthritis    CIN III (cervical intraepithelial neoplasia grade III) with severe dysplasia 11/2008   LEEP-D&C-HYSTEROSCOPY-SHOWED CIN II AND CIN III   Depression    DUB (dysfunctional uterine bleeding)    Herpes simplex type 1 infection 11/2020   Iron deficiency anemia secondary to blood loss (chronic) 07/29/2012   Kidney stones    Migraines    Recurrent upper respiratory infection (URI)    Sleep apnea    Type 2 diabetes mellitus (HCC)     Past Surgical History:  Procedure Laterality Date   ANTERIOR CRUCIATE LIGAMENT REPAIR  1998   LEFT   CERVICAL BIOPSY  W/ LOOP ELECTRODE EXCISION  11/2008   CIN 3 free margins   COLPOSCOPY     Cyst removed from groin     INTRAUTERINE DEVICE INSERTION  03/09/2017   Mirena    KNEE ARTHROCENTESIS  2006   MOUTH SURGERY     PILONIDAL CYST EXCISION  1999   Social History   Socioeconomic History   Marital status: Single    Spouse name: Not on file   Number of children: 0   Years of education: Not on file   Highest education level: Master's degree (e.g., MA, MS, MEng, MEd, MSW, MBA)  Occupational History   Not on file  Tobacco Use   Smoking status: Every Day    Current packs/day: 0.50    Types: Cigarettes   Smokeless tobacco: Never   Tobacco comments:    Started smoking ~ 43 y/o; 1/2 PPD  Vaping Use   Vaping status: Never Used  Substance and Sexual Activity   Alcohol use: Not Currently   Drug use: No   Sexual activity: Yes     Partners: Male    Birth control/protection: I.U.D.    Comment: 1st intercourse 43 yo-more than 5 partners Mirena  03/09/2017  Other Topics Concern   Not on file  Social History Narrative   P2, G0   Household- pt and boyfriend    Works at the [employer], counselling   Social Drivers of Corporate investment banker Strain: Low Risk  (08/13/2023)   Overall Financial Resource Strain (CARDIA)    Difficulty of Paying Living Expenses: Not very hard  Food Insecurity: No Food Insecurity (08/13/2023)   Hunger Vital Sign    Worried About Running Out of Food in the Last Year: Never true    Ran Out of Food in the Last Year: Never true  Transportation Needs: No Transportation Needs (08/13/2023)   PRAPARE - Administrator, Civil Service (Medical): No    Lack of Transportation (Non-Medical): No  Physical Activity: Insufficiently Active (08/13/2023)   Exercise Vital Sign    Days of Exercise per Week: 2 days    Minutes of Exercise per Session: 60 min  Stress: No Stress Concern Present (08/13/2023)   [employer] of Occupational Health - Occupational Stress Questionnaire  Feeling of Stress : Only a little  Social Connections: Moderately Integrated (08/13/2023)   Social Connection and Isolation Panel [NHANES]    Frequency of Communication with Friends and Family: More than three times a week    Frequency of Social Gatherings with Friends and Family: Three times a week    Attends Religious Services: Never    Active Member of Clubs or Organizations: Yes    Attends Banker Meetings: More than 4 times per year    Marital Status: Living with partner  Intimate Partner Violence: Unknown (08/07/2021)   Received from [facility]   HITS    Physically Hurt: Not on file    Insult or Talk Down To: Not on file    Threaten Physical Harm: Not on file    Scream or Curse: Not on file     Current Outpatient Medications  Medication Instructions   amphetamine-dextroamphetamine  (ADDERALL) 20 MG tablet 20 mg, 3 times daily   Boric Acid Vaginal 600 MG SUPP 1 ampule, Vaginal, At bedtime PRN, Use after intercourse.  Can also use for 7 days if she feels a BV infection   clonazePAM (KLONOPIN) 0.5 mg, At bedtime PRN   cyclobenzaprine (FLEXERIL) 10 MG tablet SMARTSIG:0.5-1 Tablet(s) By Mouth Every Evening   levonorgestrel  (MIRENA ) 20 MCG/24HR IUD 1 each,  Once   metroNIDAZOLE  (METROGEL ) 0.75 % vaginal gel 1 Applicatorful, Vaginal, 2 times weekly, 6 month treatment.   valACYclovir  (VALTREX ) 1000 MG tablet TAKE 1 TABLET(1000 MG) BY MOUTH TWICE DAILY       Objective:   Physical Exam BP 134/84   Pulse (!) 102   Temp 97.7 F (36.5 C) (Oral)   Resp 16   Ht 5' 4.25" (1.632 m)   Wt 175 lb 4 oz (79.5 kg)   BMI 29.85 kg/m  General: Well developed, NAD, BMI noted Neck: No  thyromegaly  HEENT:  Normocephalic . Face symmetric, atraumatic Lungs:  CTA B Normal respiratory effort, no intercostal retractions, no accessory muscle use. Heart: Slightly tachycardic, no murmur.  Abdomen:  Not distended, soft, non-tender. No rebound or rigidity.   DM foot exam: No edema, good pedal pulses, pinprick examination normal Skin: Exposed areas without rash. Not pale. Not jaundice Neurologic:  alert & oriented X3.  Speech normal, gait appropriate for age and unassisted Strength symmetric and appropriate for age.  Psych: Cognition and judgment appear intact.  Cooperative with normal attention span and concentration.  Behavior appropriate. No anxious or depressed appearing.     Assessment   Assessment (new patient 03/25/2021, transferred from another office) DM A1c 6.7  (03-2021) Anxiety; ADD  (see Dr Deborra Falter) CIN III OSA: dx ~ 2019, failed a brief trial with a CPAP, not using as off 08/2023 Hydradenitis suppurativa.  Sees dermatology Hemorrhoids: Saw general surgery 07/06/2023 H/o urolithiasis  Birth control: IUD   PLAN Here for CPX . Td 2018 Vaccines advised: PNM 20 (DM),  flu shot every fall, COVID booster is an option Female care: h/o CIN III, LEEP ~ 2010, last OV w/ gyn few weeks ago per pt  CCS: Not indicated Screening for breast cancer: referred to a MM[facility] (she did have lump,  that is largely resolved). Continue with self breast exam. Seek attention if abnormal self breast exam Labs:  BMP AST ALT FLP CBC A1c micro Diet exercise  d/w pt  Other issues addressed DM: Patient thought she was prediabetic, advised patient she has diabetes.  Feet exam negative today.  Encouraged to  continue working on diet and exercise.  Will check A1c, micro, further evaluate results. OSA: Dx 2019, was not able to keep the CPAP for enough hours  @ night thus insurance stopped covering.  Would like to revisiting the issue, refer to neurology Hemorrhoids:Saw general surgery 07/06/2023.  Recommended fiber supplements.  Good hydration.  Next visit 10/06/2023 Thrombocytosis: Per chart review, checking a CBC RTC 4 months

## 2023-08-15 LAB — CBC WITH DIFFERENTIAL/PLATELET
Absolute Lymphocytes: 2418 {cells}/uL (ref 850–3900)
Absolute Monocytes: 599 {cells}/uL (ref 200–950)
Basophils Absolute: 54 {cells}/uL (ref 0–200)
Basophils Relative: 0.5 %
Eosinophils Absolute: 75 {cells}/uL (ref 15–500)
Eosinophils Relative: 0.7 %
HCT: 42.9 % (ref 35.0–45.0)
Hemoglobin: 14 g/dL (ref 11.7–15.5)
MCH: 29 pg (ref 27.0–33.0)
MCHC: 32.6 g/dL (ref 32.0–36.0)
MCV: 89 fL (ref 80.0–100.0)
MPV: 9.4 fL (ref 7.5–12.5)
Monocytes Relative: 5.6 %
Neutro Abs: 7554 {cells}/uL (ref 1500–7800)
Neutrophils Relative %: 70.6 %
Platelets: 536 10*3/uL — ABNORMAL HIGH (ref 140–400)
RBC: 4.82 10*6/uL (ref 3.80–5.10)
RDW: 13 % (ref 11.0–15.0)
Total Lymphocyte: 22.6 %
WBC: 10.7 10*3/uL (ref 3.8–10.8)

## 2023-08-15 LAB — MICROALBUMIN / CREATININE URINE RATIO
Creatinine, Urine: 158 mg/dL (ref 20–275)
Microalb Creat Ratio: 5 mg/g{creat} (ref <30)
Microalb, Ur: 0.8 mg/dL

## 2023-08-15 LAB — LIPID PANEL
Cholesterol: 128 mg/dL (ref <200)
HDL: 41 mg/dL — ABNORMAL LOW (ref >=50)
LDL Cholesterol (Calc): 64 mg/dL
Non-HDL Cholesterol (Calc): 87 mg/dL (ref <130)
Total CHOL/HDL Ratio: 3.1 (calc) (ref <5.0)
Triglycerides: 157 mg/dL — ABNORMAL HIGH (ref <150)

## 2023-08-15 LAB — BASIC METABOLIC PANEL WITH GFR
BUN/Creatinine Ratio: 16 (calc) (ref 6–22)
BUN: 16 mg/dL (ref 7–25)
CO2: 30 mmol/L (ref 20–32)
Calcium: 10 mg/dL (ref 8.6–10.2)
Chloride: 102 mmol/L (ref 98–110)
Creat: 1.03 mg/dL — ABNORMAL HIGH (ref 0.50–0.99)
Glucose, Bld: 102 mg/dL — ABNORMAL HIGH (ref 65–99)
Potassium: 4.4 mmol/L (ref 3.5–5.3)
Sodium: 140 mmol/L (ref 135–146)
eGFR: 70 mL/min/{1.73_m2} (ref >=60)

## 2023-08-15 LAB — HEMOGLOBIN A1C
Hgb A1c MFr Bld: 6.2 % — ABNORMAL HIGH (ref <5.7)
Mean Plasma Glucose: 131 mg/dL
eAG (mmol/L): 7.3 mmol/L

## 2023-08-15 LAB — ALT: ALT: 12 U/L (ref 6–29)

## 2023-08-15 LAB — AST: AST: 13 U/L (ref 10–30)

## 2023-08-16 ENCOUNTER — Encounter: Payer: Self-pay | Admitting: Internal Medicine

## 2023-08-16 NOTE — Assessment & Plan Note (Signed)
 Here for CPX .  Other issues addressed DM: Patient thought she was prediabetic, advised patient she has diabetes.  Feet exam negative today.  Encouraged to continue working on diet and exercise.  Will check A1c, micro, further evaluate results. OSA: Dx 2019, was not able to keep the CPAP for enough hours  @ night thus insurance stopped covering.  Would like to revisiting the issue, refer to neurology Hemorrhoids:Saw general surgery 07/06/2023.  Recommended fiber supplements.  Good hydration.  Next visit 10/06/2023 Thrombocytosis: Per chart review, checking a CBC RTC 4 months

## 2023-08-16 NOTE — Assessment & Plan Note (Signed)
 Here for CPX . Td 2018 Vaccines advised: PNM 20 (DM), flu shot every fall, COVID booster is an option Female care: h/o CIN III, LEEP ~ 2010, last OV w/ gyn few weeks ago per pt  CCS: Not indicated Screening for breast cancer: referred to a MMG (she did have lump,  that is largely resolved). Continue with self breast exam. Seek attention if abnormal self breast exam Labs:  BMP AST ALT FLP CBC A1c micro Diet exercise  d/w pt

## 2023-08-17 ENCOUNTER — Encounter: Payer: Self-pay | Admitting: Internal Medicine

## 2023-08-17 NOTE — Addendum Note (Signed)
 Addended by: Shoshanah Dapper D on: 08/17/2023 10:45 AM   Modules accepted: Orders

## 2023-08-19 NOTE — Addendum Note (Signed)
 Addended by: Zoya Sprecher D on: 08/19/2023 11:47 AM   Modules accepted: Orders

## 2023-08-28 DIAGNOSIS — R070 Pain in throat: Secondary | ICD-10-CM | POA: Diagnosis not present

## 2023-08-28 DIAGNOSIS — J391 Other abscess of pharynx: Secondary | ICD-10-CM | POA: Diagnosis not present

## 2023-08-31 ENCOUNTER — Encounter: Payer: Self-pay | Admitting: Surgery

## 2023-09-02 ENCOUNTER — Telehealth (HOSPITAL_BASED_OUTPATIENT_CLINIC_OR_DEPARTMENT_OTHER): Payer: Self-pay

## 2023-09-04 DIAGNOSIS — F410 Panic disorder [episodic paroxysmal anxiety] without agoraphobia: Secondary | ICD-10-CM | POA: Diagnosis not present

## 2023-09-04 DIAGNOSIS — F411 Generalized anxiety disorder: Secondary | ICD-10-CM | POA: Diagnosis not present

## 2023-09-04 DIAGNOSIS — F331 Major depressive disorder, recurrent, moderate: Secondary | ICD-10-CM | POA: Diagnosis not present

## 2023-09-11 DIAGNOSIS — F411 Generalized anxiety disorder: Secondary | ICD-10-CM | POA: Diagnosis not present

## 2023-09-11 DIAGNOSIS — F331 Major depressive disorder, recurrent, moderate: Secondary | ICD-10-CM | POA: Diagnosis not present

## 2023-09-11 DIAGNOSIS — F410 Panic disorder [episodic paroxysmal anxiety] without agoraphobia: Secondary | ICD-10-CM | POA: Diagnosis not present

## 2023-09-18 DIAGNOSIS — F411 Generalized anxiety disorder: Secondary | ICD-10-CM | POA: Diagnosis not present

## 2023-09-18 DIAGNOSIS — F410 Panic disorder [episodic paroxysmal anxiety] without agoraphobia: Secondary | ICD-10-CM | POA: Diagnosis not present

## 2023-09-18 DIAGNOSIS — F900 Attention-deficit hyperactivity disorder, predominantly inattentive type: Secondary | ICD-10-CM | POA: Diagnosis not present

## 2023-09-18 DIAGNOSIS — F331 Major depressive disorder, recurrent, moderate: Secondary | ICD-10-CM | POA: Diagnosis not present

## 2023-09-25 ENCOUNTER — Ambulatory Visit
Admission: RE | Admit: 2023-09-25 | Discharge: 2023-09-25 | Disposition: A | Discharge status: Home / Self Care | Source: Ambulatory Visit | Attending: Internal Medicine | Admitting: Internal Medicine

## 2023-09-25 DIAGNOSIS — Z1231 Encounter for screening mammogram for malignant neoplasm of breast: Secondary | ICD-10-CM | POA: Diagnosis not present

## 2023-09-28 DIAGNOSIS — K644 Residual hemorrhoidal skin tags: Secondary | ICD-10-CM | POA: Diagnosis not present

## 2023-09-28 DIAGNOSIS — K648 Other hemorrhoids: Secondary | ICD-10-CM | POA: Diagnosis not present

## 2023-09-28 DIAGNOSIS — K625 Hemorrhage of anus and rectum: Secondary | ICD-10-CM | POA: Diagnosis not present

## 2023-09-28 DIAGNOSIS — L02214 Cutaneous abscess of groin: Secondary | ICD-10-CM | POA: Diagnosis not present

## 2023-09-29 ENCOUNTER — Other Ambulatory Visit: Payer: Self-pay | Admitting: Surgery

## 2023-09-29 DIAGNOSIS — L02214 Cutaneous abscess of groin: Secondary | ICD-10-CM

## 2023-10-01 ENCOUNTER — Other Ambulatory Visit: Payer: Self-pay | Admitting: Internal Medicine

## 2023-10-01 ENCOUNTER — Ambulatory Visit: Payer: Self-pay

## 2023-10-01 DIAGNOSIS — R928 Other abnormal and inconclusive findings on diagnostic imaging of breast: Secondary | ICD-10-CM

## 2023-10-02 DIAGNOSIS — F411 Generalized anxiety disorder: Secondary | ICD-10-CM | POA: Diagnosis not present

## 2023-10-02 DIAGNOSIS — F331 Major depressive disorder, recurrent, moderate: Secondary | ICD-10-CM | POA: Diagnosis not present

## 2023-10-02 DIAGNOSIS — F410 Panic disorder [episodic paroxysmal anxiety] without agoraphobia: Secondary | ICD-10-CM | POA: Diagnosis not present

## 2023-10-26 DIAGNOSIS — F410 Panic disorder [episodic paroxysmal anxiety] without agoraphobia: Secondary | ICD-10-CM | POA: Diagnosis not present

## 2023-10-26 DIAGNOSIS — F331 Major depressive disorder, recurrent, moderate: Secondary | ICD-10-CM | POA: Diagnosis not present

## 2023-10-26 DIAGNOSIS — F411 Generalized anxiety disorder: Secondary | ICD-10-CM | POA: Diagnosis not present

## 2023-11-06 ENCOUNTER — Ambulatory Visit
Admission: RE | Admit: 2023-11-06 | Discharge: 2023-11-06 | Disposition: A | Discharge status: Home / Self Care | Source: Ambulatory Visit | Attending: Internal Medicine | Admitting: Internal Medicine

## 2023-11-06 DIAGNOSIS — R928 Other abnormal and inconclusive findings on diagnostic imaging of breast: Secondary | ICD-10-CM

## 2023-11-06 DIAGNOSIS — N6001 Solitary cyst of right breast: Secondary | ICD-10-CM | POA: Diagnosis not present

## 2023-11-06 DIAGNOSIS — R92323 Mammographic fibroglandular density, bilateral breasts: Secondary | ICD-10-CM | POA: Diagnosis not present

## 2023-11-06 DIAGNOSIS — N6002 Solitary cyst of left breast: Secondary | ICD-10-CM | POA: Diagnosis not present

## 2023-11-13 DIAGNOSIS — F410 Panic disorder [episodic paroxysmal anxiety] without agoraphobia: Secondary | ICD-10-CM | POA: Diagnosis not present

## 2023-11-13 DIAGNOSIS — F411 Generalized anxiety disorder: Secondary | ICD-10-CM | POA: Diagnosis not present

## 2023-11-13 DIAGNOSIS — F331 Major depressive disorder, recurrent, moderate: Secondary | ICD-10-CM | POA: Diagnosis not present

## 2023-11-20 DIAGNOSIS — F410 Panic disorder [episodic paroxysmal anxiety] without agoraphobia: Secondary | ICD-10-CM | POA: Diagnosis not present

## 2023-11-20 DIAGNOSIS — F411 Generalized anxiety disorder: Secondary | ICD-10-CM | POA: Diagnosis not present

## 2023-11-20 DIAGNOSIS — F331 Major depressive disorder, recurrent, moderate: Secondary | ICD-10-CM | POA: Diagnosis not present

## 2023-12-04 ENCOUNTER — Ambulatory Visit
Admission: RE | Admit: 2023-12-04 | Discharge: 2023-12-04 | Disposition: A | Discharge status: Home / Self Care | Source: Ambulatory Visit | Attending: Surgery | Admitting: Surgery

## 2023-12-04 DIAGNOSIS — F331 Major depressive disorder, recurrent, moderate: Secondary | ICD-10-CM | POA: Diagnosis not present

## 2023-12-04 DIAGNOSIS — F410 Panic disorder [episodic paroxysmal anxiety] without agoraphobia: Secondary | ICD-10-CM | POA: Diagnosis not present

## 2023-12-04 DIAGNOSIS — F411 Generalized anxiety disorder: Secondary | ICD-10-CM | POA: Diagnosis not present

## 2023-12-04 DIAGNOSIS — R1032 Left lower quadrant pain: Secondary | ICD-10-CM | POA: Diagnosis not present

## 2023-12-04 DIAGNOSIS — L02214 Cutaneous abscess of groin: Secondary | ICD-10-CM

## 2023-12-10 NOTE — Progress Notes (Signed)
 Sent message, via epic in basket, requesting orders in epic from Careers adviser.

## 2023-12-11 DIAGNOSIS — F411 Generalized anxiety disorder: Secondary | ICD-10-CM | POA: Diagnosis not present

## 2023-12-11 DIAGNOSIS — F410 Panic disorder [episodic paroxysmal anxiety] without agoraphobia: Secondary | ICD-10-CM | POA: Diagnosis not present

## 2023-12-11 DIAGNOSIS — F331 Major depressive disorder, recurrent, moderate: Secondary | ICD-10-CM | POA: Diagnosis not present

## 2023-12-16 ENCOUNTER — Ambulatory Visit: Payer: Self-pay | Admitting: Surgery

## 2023-12-16 NOTE — Progress Notes (Signed)
Second request for pre op orders spoke with Elmyra Ricks

## 2023-12-17 NOTE — Patient Instructions (Signed)
 SURGICAL WAITING ROOM VISITATION  Patients having surgery or a procedure may have no more than 2 support people in the waiting area - these visitors may rotate.    Children under the age of 54 must have an adult with them who is not the patient.  Visitors with respiratory illnesses are discouraged from visiting and should remain at home.  If the patient needs to stay at the hospital during part of their recovery, the visitor guidelines for inpatient rooms apply. Pre-op nurse will coordinate an appropriate time for 1 support person to accompany patient in pre-op.  This support person may not rotate.    Please refer to the [facility] website for the visitor guidelines for Inpatients (after your surgery is over and you are in a regular room).       Your procedure is scheduled on: 12/30/23   Report to [facility] Main Entrance    Report to admitting at 8:15 AM   Call this number if you have problems the morning of surgery [phone]   Do not eat food or drink liquids:After Midnight.but may have sips of water with meds.     FOLLOW BOWEL PREP AND ANY ADDITIONAL PRE OP INSTRUCTIONS YOU RECEIVED FROM YOUR SURGEON'S OFFICE!!!     Oral Hygiene is also important to reduce your risk of infection.                                    Remember - BRUSH YOUR TEETH THE MORNING OF SURGERY WITH YOUR REGULAR TOOTHPASTE   Do NOT smoke after Midnight   Stop all vitamins and herbal supplements 7 days before surgery.   Take these medicines the morning of surgery with A SIP OF WATER: none  DO NOT TAKE ANY ORAL DIABETIC MEDICATIONS DAY OF YOUR SURGERY             You may not have any metal on your body including hair pins, jewelry, and body piercing             Do not wear make-up, lotions, powders, perfumes/cologne, or deodorant  Do not wear nail polish including gel and S&S, artificial/acrylic nails, or any other type of covering on natural nails including finger and toenails. If you  have artificial nails, gel coating, etc. that needs to be removed by a nail salon please have this removed prior to surgery or surgery may need to be canceled/ delayed if the surgeon/ anesthesia feels like they are unable to be safely monitored.   Do not shave  48 hours prior to surgery.    Do not bring valuables to the hospital. [place] IS NOT             RESPONSIBLE   FOR VALUABLES.   Contacts, glasses, dentures or bridgework may not be worn into surgery.   Bring small overnight bag day of surgery.   DO NOT BRING YOUR HOME MEDICATIONS TO THE HOSPITAL. PHARMACY WILL DISPENSE MEDICATIONS LISTED ON YOUR MEDICATION LIST TO YOU DURING YOUR ADMISSION IN THE HOSPITAL!    Patients discharged on the day of surgery will not be allowed to drive home.  Someone NEEDS to stay with you for the first 24 hours after anesthesia.   Special Instructions: Bring a copy of your healthcare power of attorney and living will documents the day of surgery if you haven't scanned them before.  Please read over the following fact sheets you were given: IF YOU HAVE QUESTIONS ABOUT YOUR PRE-OP INSTRUCTIONS PLEASE CALL [phone] [place]   If you received a COVID test during your pre-op visit  it is requested that you wear a mask when out in public, stay away from anyone that may not be feeling well and notify your surgeon if you develop symptoms. If you test positive for Covid or have been in contact with anyone that has tested positive in the last 10 days please notify you surgeon.    [facility] - Preparing for Surgery Before surgery, you can play an important role.  Because skin is not sterile, your skin needs to be as free of germs as possible.  You can reduce the number of germs on your skin by washing with CHG (chlorahexidine gluconate) soap before surgery.  CHG is an antiseptic cleaner which kills germs and bonds with the skin to continue killing germs even after washing. Please DO NOT use if you have  an allergy  to CHG or antibacterial soaps.  If your skin becomes reddened/irritated stop using the CHG and inform your nurse when you arrive at Short Stay. Do not shave (including legs and underarms) for at least 48 hours prior to the first CHG shower.  You may shave your face/neck.  Please follow these instructions carefully:  1.  Shower with CHG Soap the night before surgery and the  morning of surgery.  2.  If you choose to wash your hair, wash your hair first as usual with your normal  shampoo.  3.  After you shampoo, rinse your hair and body thoroughly to remove the shampoo.                             4.  Use CHG as you would any other liquid soap.  You can apply chg directly to the skin and wash.  Gently with a scrungie or clean washcloth.  5.  Apply the CHG Soap to your body ONLY FROM THE NECK DOWN.   Do   not use on face/ open                           Wound or open sores. Avoid contact with eyes, ears mouth and   genitals (private parts).                       Wash face,  Genitals (private parts) with your normal soap.             6.  Wash thoroughly, paying special attention to the area where your    surgery  will be performed.  7.  Thoroughly rinse your body with warm water from the neck down.  8.  DO NOT shower/wash with your normal soap after using and rinsing off the CHG Soap.                9.  Pat yourself dry with a clean towel.            10.  Wear clean pajamas.            11.  Place clean sheets on your bed the night of your first shower and do not  sleep with pets. Day of Surgery : Do not apply any lotions/deodorants the morning of surgery.  Please wear clean clothes to the hospital/surgery center.  FAILURE TO FOLLOW THESE INSTRUCTIONS MAY RESULT IN THE CANCELLATION OF YOUR SURGERY  How to Manage Your Diabetes Before and After Surgery  Why is it important to control my blood sugar before and after surgery? Improving blood sugar levels before and after surgery helps  healing and can limit problems. A way of improving blood sugar control is eating a healthy diet by:  Eating less sugar and carbohydrates  Increasing activity/exercise  Talking with your doctor about reaching your blood sugar goals High blood sugars (greater than 180 mg/dL) can raise your risk of infections and slow your recovery, so you will need to focus on controlling your diabetes during the weeks before surgery. Make sure that the doctor who takes care of your diabetes knows about your planned surgery including the date and location.  How do I manage my blood sugar before surgery? Check your blood sugar at least 4 times a day, starting 2 days before surgery, to make sure that the level is not too high or low. Check your blood sugar the morning of your surgery when you wake up and every 2 hours until you get to the Short Stay unit. If your blood sugar is less than 70 mg/dL, you will need to treat for low blood sugar: Do not take insulin. Treat a low blood sugar (less than 70 mg/dL) with  cup of clear juice (cranberry or apple), 4 glucose tablets, OR glucose gel. Recheck blood sugar in 15 minutes after treatment (to make sure it is greater than 70 mg/dL). If your blood sugar is not greater than 70 mg/dL on recheck, call [phone] for further instructions. Report your blood sugar to the short stay nurse when you get to Short Stay.  If you are admitted to the hospital after surgery: Your blood sugar will be checked by the staff and you will probably be given insulin after surgery (instead of oral diabetes medicines) to make sure you have good blood sugar levels. The goal for blood sugar control after surgery is 80-180 mg/dL.   WHAT DO I DO ABOUT MY DIABETES MEDICATION?  Do not take oral diabetes medicines (pills) the morning of surgery.  DO NOT TAKE THE FOLLOWING 7 DAYS PRIOR TO SURGERY: Ozempic, Wegovy, Rybelsus (Semaglutide), Byetta (exenatide), Bydureon (exenatide ER), Victoza,  Saxenda (liraglutide), or Trulicity (dulaglutide) Mounjaro (Tirzepatide) Adlyxin (Lixisenatide), Polyethylene Glycol Loxenatide.  Patient Signature:  Date:   Nurse Signature:  Date:   Reviewed and Endorsed by [facility] Patient Education Committee, August 2015

## 2023-12-17 NOTE — Progress Notes (Signed)
 COVID Vaccine received:  []  No [x]  Yes Date of any COVID positive Test in last 90 days: no PCP - Aloysius Mech MD Cardiologist - n/a  Chest x-ray -  EKG -   Stress Test -  ECHO -  Cardiac Cath -   Bowel Prep - [x]  No  []   Yes ______  Pacemaker / ICD device [x]  No []  Yes   Spinal Cord Stimulator:[x]  No []  Yes       History of Sleep Apnea? [x]  No []  Yes   CPAP used?- [x]  No []  Yes    Does the patient monitor blood sugar?          [x]  No []  Yes  []  N/A  Patient has: []  NO Hx DM   []  Pre-DM                 []  DM1  [x]   DM2 Does patient have a [employer] or Dexacom? [x]  No []  Yes   Fasting Blood Sugar Ranges-  Checks Blood Sugar ____0_ times a day  GLP1 agonist / usual dose - no GLP1 instructions:  SGLT-2 inhibitors / usual dose - no SGLT-2 instructions:   Blood Thinner / Instructions:no Aspirin Instructions:no  Comments:   Activity level: Patient is able  to climb a flight of stairs without difficulty; [x]  No CP  [x]  No SOB,   Patient can perform ADLs without assistance.   Anesthesia review:   Patient denies shortness of breath, fever, cough and chest pain at PAT appointment.  Patient verbalized understanding and agreement to the Pre-Surgical Instructions that were given to them at this PAT appointment. Patient was also educated of the need to review these PAT instructions again prior to his/her surgery.I reviewed the appropriate phone numbers to call if they have any and questions or concerns.

## 2023-12-18 ENCOUNTER — Other Ambulatory Visit: Payer: Self-pay

## 2023-12-18 ENCOUNTER — Encounter (HOSPITAL_COMMUNITY)
Admission: RE | Admit: 2023-12-18 | Discharge: 2023-12-18 | Disposition: A | Discharge status: Home / Self Care | Source: Ambulatory Visit | Attending: Surgery | Admitting: Surgery

## 2023-12-18 ENCOUNTER — Encounter (HOSPITAL_COMMUNITY): Payer: Self-pay

## 2023-12-18 ENCOUNTER — Ambulatory Visit: Admitting: Internal Medicine

## 2023-12-18 VITALS — BP 133/98 | HR 102 | Temp 98.2°F | Resp 16 | Ht 64.0 in | Wt 173.0 lb

## 2023-12-18 DIAGNOSIS — F411 Generalized anxiety disorder: Secondary | ICD-10-CM | POA: Diagnosis not present

## 2023-12-18 DIAGNOSIS — Z01818 Encounter for other preprocedural examination: Secondary | ICD-10-CM | POA: Diagnosis not present

## 2023-12-18 DIAGNOSIS — Z01812 Encounter for preprocedural laboratory examination: Secondary | ICD-10-CM | POA: Diagnosis not present

## 2023-12-18 DIAGNOSIS — E119 Type 2 diabetes mellitus without complications: Secondary | ICD-10-CM | POA: Diagnosis not present

## 2023-12-18 DIAGNOSIS — F410 Panic disorder [episodic paroxysmal anxiety] without agoraphobia: Secondary | ICD-10-CM | POA: Diagnosis not present

## 2023-12-18 DIAGNOSIS — Z01810 Encounter for preprocedural cardiovascular examination: Secondary | ICD-10-CM | POA: Diagnosis not present

## 2023-12-18 DIAGNOSIS — F331 Major depressive disorder, recurrent, moderate: Secondary | ICD-10-CM | POA: Diagnosis not present

## 2023-12-18 HISTORY — DX: Personal history of urinary calculi: Z87.442

## 2023-12-18 LAB — CBC
HCT: 44 % (ref 36.0–46.0)
Hemoglobin: 14 g/dL (ref 12.0–15.0)
MCH: 28.5 pg (ref 26.0–34.0)
MCHC: 31.8 g/dL (ref 30.0–36.0)
MCV: 89.4 fL (ref 80.0–100.0)
Platelets: 470 K/uL — ABNORMAL HIGH (ref 150–400)
RBC: 4.92 MIL/uL (ref 3.87–5.11)
RDW: 13.7 % (ref 11.5–15.5)
WBC: 11.1 K/uL — ABNORMAL HIGH (ref 4.0–10.5)
nRBC: 0 % (ref 0.0–0.2)

## 2023-12-18 LAB — HEMOGLOBIN A1C
Hgb A1c MFr Bld: 6.1 % — ABNORMAL HIGH (ref 4.8–5.6)
Mean Plasma Glucose: 128.37 mg/dL

## 2023-12-18 LAB — BASIC METABOLIC PANEL WITH GFR
Anion gap: 11 (ref 5–15)
BUN: 12 mg/dL (ref 6–20)
CO2: 26 mmol/L (ref 22–32)
Calcium: 9.5 mg/dL (ref 8.9–10.3)
Chloride: 100 mmol/L (ref 98–111)
Creatinine, Ser: 0.67 mg/dL (ref 0.44–1.00)
GFR, Estimated: >60 mL/min (ref >60)
Glucose, Bld: 151 mg/dL — ABNORMAL HIGH (ref 70–99)
Potassium: 4.3 mmol/L (ref 3.5–5.1)
Sodium: 137 mmol/L (ref 135–145)

## 2023-12-18 LAB — GLUCOSE, CAPILLARY: Glucose-Capillary: 194 mg/dL — ABNORMAL HIGH (ref 70–99)

## 2023-12-22 ENCOUNTER — Encounter: Payer: Self-pay | Admitting: Nurse Practitioner

## 2023-12-22 ENCOUNTER — Ambulatory Visit: Admitting: Nurse Practitioner

## 2023-12-22 VITALS — BP 116/64 | Resp 16 | Wt 178.0 lb

## 2023-12-22 DIAGNOSIS — Z975 Presence of (intrauterine) contraceptive device: Secondary | ICD-10-CM

## 2023-12-22 DIAGNOSIS — N898 Other specified noninflammatory disorders of vagina: Secondary | ICD-10-CM | POA: Diagnosis not present

## 2023-12-22 DIAGNOSIS — N941 Unspecified dyspareunia: Secondary | ICD-10-CM

## 2023-12-22 DIAGNOSIS — B9689 Other specified bacterial agents as the cause of diseases classified elsewhere: Secondary | ICD-10-CM

## 2023-12-22 DIAGNOSIS — N760 Acute vaginitis: Secondary | ICD-10-CM | POA: Diagnosis not present

## 2023-12-22 DIAGNOSIS — B3731 Acute candidiasis of vulva and vagina: Secondary | ICD-10-CM | POA: Diagnosis not present

## 2023-12-22 DIAGNOSIS — Z113 Encounter for screening for infections with a predominantly sexual mode of transmission: Secondary | ICD-10-CM

## 2023-12-22 LAB — WET PREP FOR TRICH, YEAST, CLUE

## 2023-12-22 MED ORDER — METRONIDAZOLE 0.75 % VA GEL: 1.0000 | Freq: Every day | VAGINAL | 0 refills | Status: AC

## 2023-12-22 MED ORDER — FLUCONAZOLE 150 MG PO TABS: 150.0000 mg | ORAL_TABLET | ORAL | 0 refills | Status: DC

## 2023-12-22 NOTE — Progress Notes (Signed)
   Acute Office Visit  Subjective:    Patient ID: [name], female    DOB: March 31, 1981, 43 y.o.   MRN: [id number]   HPI 43 y.o. presents today for pain with intercourse. Had shooting, stabbing pain with intercourse 4 days ago. Pain started in lower mid abdomen and then moved more to the left side. Pain went away that night but then had pain with intercourse again the next day. No bleeding. Has not noticed any abnormal vaginal symptoms. Does have history of recurrent BV and yeast. On antibiotics recently. Has somewhat open relationship with partner, he is also uncircumcised. Would like STD screening today. Mirena  IUD 02/2017. Doing boric acid. Feels infections are always after intercourse. Did 16-week course of Metrogel  about a year and a half ago.   No LMP recorded. (Menstrual status: IUD).    Review of Systems  Constitutional: Negative.   Genitourinary:  Positive for dyspareunia. Negative for vaginal discharge and vaginal pain.       Objective:    Physical Exam Constitutional:      Appearance: Normal appearance.  Genitourinary:    General: Normal vulva.     Vagina: Vaginal discharge (Copious, white, chunky) present. No erythema.     Cervix: Normal.     Comments: IUD visible ~1 cm    BP 116/64   Resp 16   Wt 178 lb (80.7 kg)   SpO2 99%   BMI 30.55 kg/m  Wt Readings from Last 3 Encounters:  12/22/23 178 lb (80.7 kg)  12/18/23 173 lb (78.5 kg)  08/14/23 175 lb 4 oz (79.5 kg)        [name], CMA present as Biomedical engineer.   Wet prep + yeast, clue cells (+ odor)  Assessment & Plan:   Problem List Items Addressed This Visit       Genitourinary   Recurrent vaginitis   Relevant Orders   SureSwab Mycoplasma/Ureaplasma, PCR   Other Visit Diagnoses       Dyspareunia in female    -  Primary     IUD (intrauterine device) in place         Screening examination for STD (sexually transmitted disease)       Relevant Orders   C. trachomatis/N. gonorrhoeae RNA      Bacterial vaginosis       Relevant Medications      metroNIDAZOLE  (METROGEL ) 0.75 % vaginal gel     Vaginal candidiasis       Relevant Medications   fluconazole  (DIFLUCAN ) 150 MG tablet     Vaginal discharge       Relevant Orders   WET PREP FOR TRICH, YEAST, CLUE      Plan: Wet prep positive for yeast and clue cells. Diflucan  150 mg every 3 days x 2 doses. Metrogel  nightly x 5 nights. Ureaplasma/mycoplasma, GC/CT pending. Declines HIV/RPR. Boric acid twice weekly or after intercourse. Begin women's probiotic. Reinfection could be from uncircumcised partner. Condoms recommended.   Return if symptoms worsen or fail to improve.    Rachael DELENA Shutter DNP, 3:04 PM 12/22/2023

## 2023-12-23 LAB — C. TRACHOMATIS/N. GONORRHOEAE RNA
C. trachomatis RNA, TMA: NOT DETECTED
N. gonorrhoeae RNA, TMA: NOT DETECTED

## 2023-12-24 ENCOUNTER — Ambulatory Visit: Payer: Self-pay | Admitting: Nurse Practitioner

## 2023-12-25 ENCOUNTER — Other Ambulatory Visit (HOSPITAL_COMMUNITY)

## 2023-12-27 LAB — SURESWAB MYCOPLASMA/UREAPLASMA, PCR
M. hominis DNA: DETECTED — AB
MYCOPLASMA GENITALIUM, rRNA,TMA: NOT DETECTED
U. parvum DNA: DETECTED — AB
U. urealyticum DNA: DETECTED — AB

## 2023-12-28 ENCOUNTER — Other Ambulatory Visit: Payer: Self-pay | Admitting: Nurse Practitioner

## 2023-12-28 DIAGNOSIS — A493 Mycoplasma infection, unspecified site: Secondary | ICD-10-CM

## 2023-12-28 MED ORDER — ONDANSETRON HCL 8 MG PO TABS: 8.0000 mg | ORAL_TABLET | Freq: Two times a day (BID) | ORAL | 0 refills | Status: DC | PRN

## 2023-12-28 MED ORDER — DOXYCYCLINE MONOHYDRATE 100 MG PO CAPS: 100.0000 mg | ORAL_CAPSULE | Freq: Two times a day (BID) | ORAL | 0 refills | Status: AC

## 2023-12-28 NOTE — Telephone Encounter (Signed)
 Per review of EPIC, patient is scheduled for a hemorrhoidectomy tomorrow.

## 2023-12-29 ENCOUNTER — Other Ambulatory Visit: Payer: Self-pay | Admitting: Radiology

## 2023-12-29 DIAGNOSIS — A493 Mycoplasma infection, unspecified site: Secondary | ICD-10-CM

## 2023-12-29 DIAGNOSIS — B3731 Acute candidiasis of vulva and vagina: Secondary | ICD-10-CM

## 2023-12-29 MED ORDER — ONDANSETRON HCL 8 MG PO TABS: 8.0000 mg | ORAL_TABLET | Freq: Two times a day (BID) | ORAL | 0 refills | Status: AC | PRN

## 2023-12-29 MED ORDER — FLUCONAZOLE 150 MG PO TABS
150.0000 mg | ORAL_TABLET | ORAL | 0 refills | Status: DC
Start: 2023-12-29 — End: 2024-02-12

## 2023-12-29 NOTE — Telephone Encounter (Signed)
 Please see patients messages on how the usual amount does not help & please approve rx with the dosage information you would like her to take.

## 2023-12-29 NOTE — Telephone Encounter (Signed)
 Rx resent.

## 2023-12-30 ENCOUNTER — Ambulatory Visit (HOSPITAL_COMMUNITY): Admitting: Anesthesiology

## 2023-12-30 ENCOUNTER — Encounter (HOSPITAL_COMMUNITY)
Admission: RE | Disposition: A | Discharge status: Home / Self Care | Payer: Self-pay | Source: Ambulatory Visit | Attending: Surgery

## 2023-12-30 ENCOUNTER — Other Ambulatory Visit: Payer: Self-pay

## 2023-12-30 ENCOUNTER — Encounter (HOSPITAL_COMMUNITY): Payer: Self-pay | Admitting: Surgery

## 2023-12-30 ENCOUNTER — Ambulatory Visit (HOSPITAL_COMMUNITY)
Admission: RE | Admit: 2023-12-30 | Discharge: 2023-12-30 | Disposition: A | Discharge status: Home / Self Care | Source: Ambulatory Visit | Attending: Surgery | Admitting: Surgery

## 2023-12-30 DIAGNOSIS — L905 Scar conditions and fibrosis of skin: Secondary | ICD-10-CM | POA: Diagnosis not present

## 2023-12-30 DIAGNOSIS — Z792 Long term (current) use of antibiotics: Secondary | ICD-10-CM | POA: Diagnosis not present

## 2023-12-30 DIAGNOSIS — F418 Other specified anxiety disorders: Secondary | ICD-10-CM

## 2023-12-30 DIAGNOSIS — E119 Type 2 diabetes mellitus without complications: Secondary | ICD-10-CM | POA: Insufficient documentation

## 2023-12-30 DIAGNOSIS — F17210 Nicotine dependence, cigarettes, uncomplicated: Secondary | ICD-10-CM | POA: Diagnosis not present

## 2023-12-30 DIAGNOSIS — L732 Hidradenitis suppurativa: Secondary | ICD-10-CM | POA: Insufficient documentation

## 2023-12-30 DIAGNOSIS — K649 Unspecified hemorrhoids: Secondary | ICD-10-CM | POA: Diagnosis not present

## 2023-12-30 DIAGNOSIS — K641 Second degree hemorrhoids: Secondary | ICD-10-CM | POA: Diagnosis not present

## 2023-12-30 DIAGNOSIS — G4733 Obstructive sleep apnea (adult) (pediatric): Secondary | ICD-10-CM

## 2023-12-30 DIAGNOSIS — F909 Attention-deficit hyperactivity disorder, unspecified type: Secondary | ICD-10-CM | POA: Diagnosis not present

## 2023-12-30 DIAGNOSIS — G4730 Sleep apnea, unspecified: Secondary | ICD-10-CM | POA: Diagnosis not present

## 2023-12-30 DIAGNOSIS — K644 Residual hemorrhoidal skin tags: Secondary | ICD-10-CM | POA: Diagnosis present

## 2023-12-30 HISTORY — PX: HYDRADENITIS EXCISION: SHX5243

## 2023-12-30 HISTORY — PX: HEMORRHOID SURGERY: SHX153

## 2023-12-30 LAB — POCT PREGNANCY, URINE: Preg Test, Ur: NEGATIVE

## 2023-12-30 LAB — GLUCOSE, CAPILLARY: Glucose-Capillary: 126 mg/dL — ABNORMAL HIGH (ref 70–99)

## 2023-12-30 SURGERY — HEMORRHOIDECTOMY
Anesthesia: General | Site: Rectum

## 2023-12-30 MED ORDER — LACTATED RINGERS IV SOLN: INTRAVENOUS | Status: DC

## 2023-12-30 MED ORDER — MIDAZOLAM HCL 5 MG/5ML IJ SOLN
INTRAMUSCULAR | Status: DC | PRN
  Administered 2023-12-30: 2 mg via INTRAVENOUS

## 2023-12-30 MED ORDER — HYDROMORPHONE HCL 1 MG/ML IJ SOLN
INTRAMUSCULAR | Status: DC | PRN
  Administered 2023-12-30 (×2): .5 mg via INTRAVENOUS

## 2023-12-30 MED ORDER — DIBUCAINE (PERIANAL) 1 % EX OINT
TOPICAL_OINTMENT | CUTANEOUS | Status: AC
Start: 2023-12-30 — End: 2023-12-30
  Filled 2023-12-30: qty 28

## 2023-12-30 MED ORDER — BUPIVACAINE-EPINEPHRINE (PF) 0.25% -1:200000 IJ SOLN
INTRAMUSCULAR | Status: DC | PRN
  Administered 2023-12-30: 50 mL

## 2023-12-30 MED ORDER — LIDOCAINE HCL (CARDIAC) PF 100 MG/5ML IV SOSY
PREFILLED_SYRINGE | INTRAVENOUS | Status: DC | PRN
  Administered 2023-12-30: 60 mg via INTRAVENOUS

## 2023-12-30 MED ORDER — DROPERIDOL 2.5 MG/ML IJ SOLN: 0.6250 mg | Freq: Once | INTRAMUSCULAR | Status: DC | PRN

## 2023-12-30 MED ORDER — HYDROMORPHONE HCL 2 MG/ML IJ SOLN
INTRAMUSCULAR | Status: AC
  Filled 2023-12-30: qty 1

## 2023-12-30 MED ORDER — ONDANSETRON HCL 4 MG/2ML IJ SOLN
INTRAMUSCULAR | Status: DC | PRN
  Administered 2023-12-30: 4 mg via INTRAVENOUS

## 2023-12-30 MED ORDER — CHLORHEXIDINE GLUCONATE CLOTH 2 % EX PADS: 6.0000 | MEDICATED_PAD | Freq: Once | CUTANEOUS | Status: DC

## 2023-12-30 MED ORDER — ONDANSETRON HCL 4 MG/2ML IJ SOLN
INTRAMUSCULAR | Status: AC
  Filled 2023-12-30: qty 2

## 2023-12-30 MED ORDER — DEXAMETHASONE SODIUM PHOSPHATE 10 MG/ML IJ SOLN
INTRAMUSCULAR | Status: DC | PRN
  Administered 2023-12-30: 10 mg via INTRAVENOUS

## 2023-12-30 MED ORDER — ONDANSETRON HCL 4 MG/2ML IJ SOLN: 4.0000 mg | Freq: Once | INTRAMUSCULAR | Status: DC | PRN

## 2023-12-30 MED ORDER — SODIUM CHLORIDE (PF) 0.9 % IJ SOLN
INTRAMUSCULAR | Status: AC
Start: 2023-12-30 — End: 2023-12-30
  Filled 2023-12-30: qty 10

## 2023-12-30 MED ORDER — FENTANYL CITRATE (PF) 100 MCG/2ML IJ SOLN
INTRAMUSCULAR | Status: DC | PRN
  Administered 2023-12-30: 100 ug via INTRAVENOUS

## 2023-12-30 MED ORDER — SUGAMMADEX SODIUM 200 MG/2ML IV SOLN
INTRAVENOUS | Status: AC
Start: 2023-12-30 — End: 2023-12-30
  Filled 2023-12-30: qty 2

## 2023-12-30 MED ORDER — PHENYLEPHRINE 80 MCG/ML (10ML) SYRINGE FOR IV PUSH (FOR BLOOD PRESSURE SUPPORT)
PREFILLED_SYRINGE | INTRAVENOUS | Status: DC | PRN
  Administered 2023-12-30 (×4): 160 ug via INTRAVENOUS

## 2023-12-30 MED ORDER — ROCURONIUM BROMIDE 100 MG/10ML IV SOLN
INTRAVENOUS | Status: DC | PRN
  Administered 2023-12-30: 70 mg via INTRAVENOUS

## 2023-12-30 MED ORDER — PROPOFOL 10 MG/ML IV BOLUS
INTRAVENOUS | Status: AC
Start: 2023-12-30 — End: 2023-12-30
  Filled 2023-12-30: qty 20

## 2023-12-30 MED ORDER — OXYCODONE HCL 5 MG PO TABS
ORAL_TABLET | ORAL | Status: AC
  Filled 2023-12-30: qty 1

## 2023-12-30 MED ORDER — SUGAMMADEX SODIUM 200 MG/2ML IV SOLN
INTRAVENOUS | Status: DC | PRN
  Administered 2023-12-30: 200 mg via INTRAVENOUS

## 2023-12-30 MED ORDER — LIDOCAINE HCL (PF) 2 % IJ SOLN
INTRAMUSCULAR | Status: AC
  Filled 2023-12-30: qty 5

## 2023-12-30 MED ORDER — ORAL CARE MOUTH RINSE: 15.0000 mL | Freq: Once | OROMUCOSAL | Status: AC

## 2023-12-30 MED ORDER — OXYCODONE HCL 5 MG PO TABS
5.0000 mg | ORAL_TABLET | Freq: Once | ORAL | Status: AC | PRN
  Administered 2023-12-30: 5 mg via ORAL

## 2023-12-30 MED ORDER — LACTATED RINGERS IV SOLN: INTRAVENOUS | Status: DC | PRN

## 2023-12-30 MED ORDER — HYDROMORPHONE HCL 1 MG/ML IJ SOLN: 0.2500 mg | INTRAMUSCULAR | Status: DC | PRN

## 2023-12-30 MED ORDER — FLEET ENEMA RE ENEM: 1.0000 | ENEMA | Freq: Once | RECTAL | Status: DC

## 2023-12-30 MED ORDER — OXYCODONE HCL 5 MG PO TABS: 5.0000 mg | ORAL_TABLET | Freq: Four times a day (QID) | ORAL | 0 refills | Status: AC | PRN

## 2023-12-30 MED ORDER — FENTANYL CITRATE (PF) 100 MCG/2ML IJ SOLN
INTRAMUSCULAR | Status: AC
  Filled 2023-12-30: qty 2

## 2023-12-30 MED ORDER — ROCURONIUM BROMIDE 10 MG/ML (PF) SYRINGE
PREFILLED_SYRINGE | INTRAVENOUS | Status: AC
  Filled 2023-12-30: qty 10

## 2023-12-30 MED ORDER — OXYCODONE HCL 5 MG/5ML PO SOLN: 5.0000 mg | Freq: Once | ORAL | Status: AC | PRN

## 2023-12-30 MED ORDER — BUPIVACAINE-EPINEPHRINE (PF) 0.25% -1:200000 IJ SOLN
INTRAMUSCULAR | Status: AC
Start: 2023-12-30 — End: 2023-12-30
  Filled 2023-12-30: qty 30

## 2023-12-30 MED ORDER — ACETAMINOPHEN 500 MG PO TABS
1000.0000 mg | ORAL_TABLET | ORAL | Status: AC
  Administered 2023-12-30: 1000 mg via ORAL
  Filled 2023-12-30: qty 2

## 2023-12-30 MED ORDER — 0.9 % SODIUM CHLORIDE (POUR BTL) OPTIME
TOPICAL | Status: DC | PRN
  Administered 2023-12-30: 1000 mL

## 2023-12-30 MED ORDER — DIBUCAINE (PERIANAL) 1 % EX OINT
TOPICAL_OINTMENT | CUTANEOUS | Status: DC | PRN
  Administered 2023-12-30: 1 via RECTAL

## 2023-12-30 MED ORDER — CHLORHEXIDINE GLUCONATE 0.12 % MT SOLN
15.0000 mL | Freq: Once | OROMUCOSAL | Status: AC
  Administered 2023-12-30: 15 mL via OROMUCOSAL

## 2023-12-30 MED ORDER — PROPOFOL 10 MG/ML IV BOLUS
INTRAVENOUS | Status: DC | PRN
  Administered 2023-12-30: 180 mg via INTRAVENOUS

## 2023-12-30 MED ORDER — DEXAMETHASONE SODIUM PHOSPHATE 10 MG/ML IJ SOLN
INTRAMUSCULAR | Status: AC
Start: 2023-12-30 — End: 2023-12-30
  Filled 2023-12-30: qty 1

## 2023-12-30 MED ORDER — METHYLENE BLUE 20 MG/2ML IV SOSY
PREFILLED_SYRINGE | INTRAVENOUS | Status: AC
Start: 2023-12-30 — End: 2023-12-30
  Filled 2023-12-30: qty 2

## 2023-12-30 MED ORDER — MIDAZOLAM HCL 2 MG/2ML IJ SOLN
INTRAMUSCULAR | Status: AC
  Filled 2023-12-30: qty 2

## 2023-12-30 MED ORDER — CEFAZOLIN SODIUM-DEXTROSE 2-4 GM/100ML-% IV SOLN
2.0000 g | INTRAVENOUS | Status: AC
  Administered 2023-12-30: 2 g via INTRAVENOUS
  Filled 2023-12-30: qty 100

## 2023-12-30 SURGICAL SUPPLY — 38 items
BAG COUNTER SPONGE SURGICOUNT (BAG) IMPLANT
BENZOIN TINCTURE PRP APPL 2/3 (GAUZE/BANDAGES/DRESSINGS) IMPLANT
BLADE SURG 15 STRL LF DISP TIS (BLADE) IMPLANT
BRIEF MESH DISP LRG (UNDERPADS AND DIAPERS) ×2 IMPLANT
COVER SURGICAL LIGHT HANDLE (MISCELLANEOUS) ×2 IMPLANT
DERMABOND ADVANCED .7 DNX12 (GAUZE/BANDAGES/DRESSINGS) IMPLANT
DISSECTOR SURG LIGASURE 21 (MISCELLANEOUS) IMPLANT
DRAPE LAPAROTOMY T 102X78X121 (DRAPES) ×2 IMPLANT
ELECT NDL TIP 2.8 STRL (NEEDLE) ×2 IMPLANT
ELECT NEEDLE TIP 2.8 STRL (NEEDLE) ×2 IMPLANT
ELECT PENCIL ROCKER SW 15FT (MISCELLANEOUS) IMPLANT
ELECT REM PT RETURN 15FT ADLT (MISCELLANEOUS) ×2 IMPLANT
GAUZE 4X4 16PLY ~~LOC~~+RFID DBL (SPONGE) ×2 IMPLANT
GAUZE PAD ABD 8X10 STRL (GAUZE/BANDAGES/DRESSINGS) IMPLANT
GAUZE SPONGE 4X4 12PLY STRL (GAUZE/BANDAGES/DRESSINGS) IMPLANT
GLOVE BIO SURGEON STRL SZ7.5 (GLOVE) ×2 IMPLANT
GLOVE INDICATOR 8.0 STRL GRN (GLOVE) ×2 IMPLANT
GOWN STRL REUS W/ TWL XL LVL3 (GOWN DISPOSABLE) ×2 IMPLANT
KIT BASIN OR (CUSTOM PROCEDURE TRAY) ×2 IMPLANT
KIT TURNOVER KIT A (KITS) ×2 IMPLANT
LOOP VESSEL MAXI BLUE (MISCELLANEOUS) IMPLANT
NDL HYPO 22X1.5 SAFETY MO (MISCELLANEOUS) ×2 IMPLANT
NEEDLE HYPO 22X1.5 SAFETY MO (MISCELLANEOUS) ×2 IMPLANT
PACK BASIC VI WITH GOWN DISP (CUSTOM PROCEDURE TRAY) ×2 IMPLANT
PAK SCROTO (SET/KITS/TRAYS/PACK) IMPLANT
RETRACTOR RING URO 16.6X16.6 (MISCELLANEOUS) IMPLANT
RETRACTOR STAY HOOK 5MM (MISCELLANEOUS) IMPLANT
SHEARS HARMONIC 9CM CVD (BLADE) IMPLANT
SPIKE FLUID TRANSFER (MISCELLANEOUS) ×2 IMPLANT
SURGILUBE 2OZ TUBE FLIPTOP (MISCELLANEOUS) ×2 IMPLANT
SUT CHROMIC 2 0 SH (SUTURE) IMPLANT
SUT CHROMIC 3 0 SH 27 (SUTURE) IMPLANT
SUT MNCRL AB 4-0 PS2 18 (SUTURE) IMPLANT
SUT VIC AB 2-0 SH 27X BRD (SUTURE) IMPLANT
SUT VIC AB 2-0 UR6 27 (SUTURE) ×4 IMPLANT
SUT VIC AB 3-0 SH 27XBRD (SUTURE) IMPLANT
SYR 20ML LL LF (SYRINGE) ×2 IMPLANT
TOWEL OR 17X26 10 PK STRL BLUE (TOWEL DISPOSABLE) ×2 IMPLANT

## 2023-12-30 NOTE — Discharge Instructions (Addendum)
 POST OP INSTRUCTIONS  DIET: As tolerated. Follow a light bland diet the first 24 hours after arrival home, such as soup, liquids, crackers, etc.  Be sure to include lots of fluids daily.  Avoid fast food or heavy meals as your are more likely to get nauseated.  Eat a low fat the next few days after surgery.  Take your usually prescribed home medications unless otherwise directed.  PAIN CONTROL: Pain is best controlled by a usual combination of three different methods TOGETHER: Ice/Heat Over the counter pain medication Prescription pain medication Most patients will experience some swelling and bruising around the surgical site.  Ice packs or heating pads (30-60 minutes up to 6 times a day) will help. Some people prefer to use ice alone, heat alone, alternating between ice & heat.  Experiment to what works for you.  Swelling and bruising can take several weeks to resolve.   It is helpful to take an over-the-counter pain medication regularly for the first few weeks: Ibuprofen  (Motrin /Advil ) - 200mg  tabs - take 3 tabs (600mg ) every 6 hours as needed for pain Acetaminophen  (Tylenol ) - you may take 650mg  every 6 hours as needed. You can take this with motrin  as they act differently on the body. If you are taking a narcotic pain medication that has acetaminophen  in it, do not take over the counter tylenol  at the same time.  Iii. NOTE: You may take both of these medications together - most patients  find it most helpful when alternating between the two (i.e. Ibuprofen  at 6am,  tylenol  at 9am, ibuprofen  at 12pm ..[name]) A  prescription for pain medication should be given to you upon discharge.  Take your pain medication as prescribed if your pain is not adequatly controlled with the over-the-counter pain reliefs mentioned above.  Avoid getting constipated.  Between the surgery and the pain medications, it is common to experience some constipation.  Increasing fluid intake and taking a fiber supplement (such as  Metamucil, Citrucel, FiberCon, MiraLax, etc) 1-2 times a day regularly will usually help prevent this problem from occurring.  A mild laxative (prune juice, Milk of Magnesia, MiraLax, etc) should be taken according to package directions if there are no bowel movements after 48 hours.    Dressing: Your groin incision is covered in Dermabond which is like sterile superglue for the skin. This will come off on it's own in a couple weeks. It is waterproof and you may bathe normally starting the day after your surgery in a shower. Avoid baths/pools/lakes/oceans until your wounds have fully healed.  Your hemorrhoidectomy sites were closed with absorbable suture material.  It is okay to have bowel movements normally and wipe/cleanse the area.  Many patients find it helpful to rinse off in the shower after bowel movement.  Really work to keep stool soft to help with pain control.  Topical Dibucaine (or over the counter Recticare) ointment may be applied every 6-8 hours externally to the perianal skin to help soothe the area.  ACTIVITIES as tolerated:   Avoid heavy lifting (>10lbs or 1 gallon of milk) for the next 6 weeks. You may resume regular (light) daily activities beginning the next day--such as daily self-care, walking, climbing stairs--gradually increasing activities as tolerated.  If you can walk 30 minutes without difficulty, it is safe to try more intense activity such as jogging, treadmill, bicycling, low-impact aerobics.  DO NOT PUSH THROUGH PAIN.  Let pain be your guide: If it hurts to do something, don't do it. You  may drive when you are no longer taking prescription pain medication, you can comfortably wear a seatbelt, and you can safely maneuver your car and apply brakes.   FOLLOW UP in our office Please call [facility] at [phone] to set up an appointment to see your surgeon in the office for a follow-up appointment approximately 2 weeks after your surgery. Make sure that you call for this  appointment the day you arrive home to insure a convenient appointment time.  9. If you have disability or family leave forms that need to be completed, you may have them completed by your primary care physician's office; for return to work instructions, please ask our office staff and they will be happy to assist you in obtaining this documentation   When to call us  [phone]: Poor pain control Reactions / problems with new medications (rash/itching, etc)  Fever over 101.5 F (38.5 C) Inability to urinate Nausea/vomiting Worsening swelling or bruising Continued bleeding from incision. Increased pain, redness, or drainage from the incision  The clinic staff is available to answer your questions during regular business hours (8:30am-5pm).  Please don't hesitate to call and ask to speak to one of our nurses for clinical concerns.   A surgeon from [facility] is always on call at the hospitals   If you have a medical emergency, go to the nearest emergency room or call 911.  [facility] [address], [place], [place]  [address] MAIN: [phone] FAX: [phone] [email or web address]

## 2023-12-30 NOTE — Transfer of Care (Signed)
 Immediate Anesthesia Transfer of Care Note  Patient: [name]  Procedure(s) Performed: HEMORRHOIDECTOMY (Rectum) EXCISION, HIDRADENITIS, INGUINAL REGION (Left: Groin)  Patient Location: PACU  Anesthesia Type:General  Level of Consciousness: awake and alert   Airway & Oxygen Therapy: Patient Spontanous Breathing and Patient connected to face mask oxygen  Post-op Assessment: Report given to RN and Post -op Vital signs reviewed and stable  Post vital signs: Reviewed and stable  Last Vitals:  Vitals Value Taken Time  BP 132/86 12/30/23 11:15  Temp    Pulse 88 12/30/23 11:18  Resp 22 12/30/23 11:18  SpO2 100 % 12/30/23 11:18  Vitals shown include unfiled device data.  Last Pain:  Vitals:   12/30/23 0917  TempSrc: Oral  PainSc:          Complications: No notable events documented.

## 2023-12-30 NOTE — Op Note (Signed)
 12/30/2023  11:15 AM  PATIENT:  [name]  43 y.o. female  Patient Care Team: Amon Aloysius BRAVO, MD as PCP - General (Internal Medicine)  PRE-OPERATIVE DIAGNOSIS:  Left groin hidradenitis; hemorrhoids  POST-OPERATIVE DIAGNOSIS:  Same  PROCEDURE:   Excision of left groin hidradenitis - 5 x 2 cm Hemorrhoidectomy - 2 column Anorectal exam under anesthesia  SURGEON:  Surgeon(s): Teresa Lonni HERO, MD  ASSISTANT: OR staff   ANESTHESIA:   local and general  SPECIMEN:   Left groin hidradenitis -oriented with short suture superior, long suture lateral Anterior midline hemorrhoidal tissue Posterior midline hemorrhoidal tissue  DISPOSITION OF SPECIMEN:  PATHOLOGY  COUNTS:  Sponge, needle, and instrument counts were reported correct x2 at conclusion.  EBL: 5 mL  PLAN OF CARE: Discharge to home after PACU  PATIENT DISPOSITION:  PACU - hemodynamically stable.  OR FINDINGS: Subtle scarring/chronic hidradenitis in the left groin.  No active inflammation/erythema or purulent fluid.  All visible disease was excised.  2 discrete hemorrhoidal bundles primarily of external hemorrhoidal tissue in the anterior midline and posterior midline.  These were also excised.  DESCRIPTION: The patient was seen in the pre-op holding area. The risks, benefits, complications, treatment options, and expected outcomes were previously discussed with the patient. The patient agreed with the proposed plan and has signed the informed consent form. The patient was brought to the operating room by the surgical team, identified as [name], and the procedure verified. SCD's were applied. General anesthesia was induced without difficulty. She was then positioned in lithotomy with Allen stirrups.  Pressure points were evaluated and padded. The patient was then prepped and draped in usual sterile fashion. A time out was completed and the above information confirmed and need for preoperative  antibiotics.  We began first with the left groin as this was the least contaminated field and the planned procedures today.  Area of premarked disease was identified in the left groin.  We had marked the area of concern with her guidance in preop.  An elliptical incision was created.  This incorporated all visible scarring/disease.  This was then excised electrocautery all the way down including the subcutaneous tissue.  There is no purulence encountered.  Specimen is then oriented with a short suture superior and a long suture laterally.  This was passed off as specimen.  The wound is irrigated.  Hemostasis is verified.  The wound is then closed in layers using 3-0 Vicryl deep dermal suture followed by running 4-0 Monocryl subcuticular suture.  The wound is washed and dried.  Dermabond is applied.  A perianal block was then created using a dilute mixture of 0.25% Marcaine  with epinephrine  and Exparel .  A well lubricated digital rectal exam was performed. This demonstrated no palpable abnormalities.  A Hill-Ferguson anoscope was into the anal canal and circumferential inspection demonstrated healthy appearing anoderm.  No significant internal hemorrhoidal component.  Externally, she does have 2 prominent external hemorrhoidal bundles in both the anterior midline and posterior midline.    Attention is directed to the anterior hemorrhoidal tissue first.  The simplest/most tension-free type excision involves excising this hemorrhoidal tissue sharply and dissecting it free of the underlying external sphincter.  We then opted to close this wound transversely as it resulted in the least amount of tension and additionally did not create any new tags nor cause any narrowing.  Hemorrhoidal tissue was passed off as specimen.  Closure was accomplished using a 3-0 chromic suture in an interrupted fashion.  There is good apposition of the skin edges without any significant tags.  Attention send directed at the  posterior midline hemorrhoidal tissue.  This is more amenable to traditional hemorrhoidectomy.  Humeral tissue was elevated.  The skin is incised sharply.  It is dissected free of the underlying external sphincter muscle.  Dissection is carried out using electrocautery.  All visible hemorrhoidal tissue in the posterior position is excised similarly and passed off as specimen.  Hemostasis is verified.  The hemorrhoidal defect is then closed using a running 3-0 Vicryl suture longitudinally.  The anal canal was reinspected.  There is no significant narrowing or stenosis.  Additional local anesthetic is infiltrated around the hemorrhoidectomy sites.  All sponge, needle, and instrument counts were reported correct.  Topical Dibucaine is applied.  Dressing consisting of 4 x 4's, ABD, mesh underwear was ultimately used.  She was then taken out of the lithotomy position, awakened from anesthesia, extubated, and transferred to a stretcher for transport to recovery in satisfactory condition.  DISPOSITION: PACU in satisfactory condition.

## 2023-12-30 NOTE — Anesthesia Procedure Notes (Signed)
 Procedure Name: Intubation Date/Time: 12/30/2023 10:13 AM  Performed by: Deeann Eva BROCKS, CRNAPre-anesthesia Checklist: Patient identified, Emergency Drugs available, Suction available and Patient being monitored Patient Re-evaluated:Patient Re-evaluated prior to induction Oxygen Delivery Method: Circle System Utilized Preoxygenation: Pre-oxygenation with 100% oxygen Induction Type: IV induction Ventilation: Mask ventilation without difficulty Laryngoscope Size: Glidescope and 3 Grade View: Grade I Tube type: Oral Tube size: 7.0 mm Number of attempts: 1 Airway Equipment and Method: Stylet Placement Confirmation: ETT inserted through vocal cords under direct vision, positive ETCO2 and breath sounds checked- equal and bilateral Secured at: 21 cm Tube secured with: Tape Dental Injury: Teeth and Oropharynx as per pre-operative assessment  Comments: Patient intubated by Warren Gardner Paramedic Student, with glidescope mac 3.

## 2023-12-30 NOTE — Anesthesia Postprocedure Evaluation (Signed)
 Anesthesia Post Note  Patient: [name]  Procedure(s) Performed: HEMORRHOIDECTOMY (Rectum) EXCISION, HIDRADENITIS, INGUINAL REGION (Left: Groin)     Patient location during evaluation: PACU Anesthesia Type: General Level of consciousness: awake and alert and oriented Pain management: pain level controlled Vital Signs Assessment: post-procedure vital signs reviewed and stable Respiratory status: spontaneous breathing, nonlabored ventilation and respiratory function stable Cardiovascular status: blood pressure returned to baseline and stable Postop Assessment: no apparent nausea or vomiting Anesthetic complications: no   No notable events documented.  Last Vitals:  Vitals:   12/30/23 1130 12/30/23 1145  BP: 126/89 (!) 127/97  Pulse: 90 86  Resp: 16 20  Temp:    SpO2: 100% 98%    Last Pain:  Vitals:   12/30/23 1134  TempSrc:   PainSc: 4                  Aryon Nham A.

## 2023-12-30 NOTE — H&P (Addendum)
 CC: Here today for surgery  HPI: [name] is an 43 y.o. female with history of nephrolithiasis, whom is seen in the office today as a referral by Dr. Dallie for evaluation of hemorrhoids.   She reports that she has had symptoms of hemorrhoidal prolapse dating back to when she was 43 years of age. She reports at the age of 44 she did have significant constipation. She reported a lot of straining. She reports that she was told by her pediatrician that she had some hemorrhoids. She has dealt with them since that time.  She reports that she is not currently taking any stool softeners, laxatives, or fiber supplements. She drinks virtually no water per day. She spends 30+ minutes on the commode with bowel movements. She reports that she has on average variable consistency bowel movement daily. She does report occasional bright red blood per rectum, on the toilet paper but not admixed with the stool.  INTERVAL HX She returns today for follow-up.   She brings up to issues on today's visit- 1. Hidradenitis of the left groin 2. Perianal external hemorrhoidal tags  With regards to the external hemorrhoids, she reports that she has now been consistently taking a fiber supplement-Metamucil and that her stools are consistently soft. She spends no more than 5 minutes on the commode. Denies any straining. Reports no bright red blood per rectum since we saw her in March. Denies any anorectal pain with bowel movements. She denies any tissue prolapse. She states that the only issue that she is experiencing now is stool trapping around the external hemorrhoidal tissue which requires her to wipe in every direction and even then still has some stool trapping. Denies any anorectal pain.  With regards to the hidradenitis, she reports that she has a longstanding history of this. Her only active area of disease is in her left groin. She has noticed some swelling there for the last couple of months. She has been  following with dermatology at [facility]-Dr. Paulita. He currently has her on suppressive antibiotics. Her symptoms of discomfort and swelling in this location have persisted now for many weeks despite this. She states that if she comes off this antibiotic it will swell even more. She has no active drainage at present.  She has been undergoing laser treatments numerous treatments of that to bilateral medial thighs, mons pubis, vulva, labia.  Prior hidradenitis excision by Dr. Vernetta 10/20/2017 at surgical center in [place]. He did excise a chronically inflamed left groin cyst. This measured 2 cm.  Also reports history of abdominal hernia noted on CT in 2019 and points to her RUQ. She has CT showing fat containing umbilical hernia. No symptoms.  Ultrasound left groin 12/04/23 2.5 x 3.0 x 1.4 cm superficial area of hypoechogenicity in the region of the concern as identified by the patient. This is nonspecific and could represent focal edema, a phlegmon or early abscess without organized rim. CT imaging with contrast may prove helpful to further evaluate as clinically warranted. Follow-up recommended to ensure resolution.  She denies any changes in health or health history since we met in the office. No new medications/allergies. She states she is ready for surgery today.  PMH: HGSIL cervix; anxiety (prn clonazapam), adhd (takes adderall 20 mg daily), takes boric acid daily  PSH: ACL tear-1989; arthrocentesis (jaw, 2003); wisdom teeth 1997; pilonidal cyst removal, 2001; sebaceous cyst removal, 2019  FHx: Mother has lupus; denies any known family history of colorectal, breast, endometrial or ovarian cancer  Social Hx: +tobacco use 1/2 ppd; 1-2 drinks of EtOH per week; marijuana OTC Gummies occasionally. She is here today by herself.    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   Kidney stones    Migraines    Recurrent upper respiratory infection (URI)    Sleep apnea    Type 2 diabetes mellitus (HCC)     Past Surgical History:  Procedure Laterality Date   ANTERIOR CRUCIATE LIGAMENT REPAIR  1998   LEFT   CERVICAL BIOPSY  W/ LOOP ELECTRODE EXCISION  11/2008   CIN 3 free margins   COLPOSCOPY     Cyst removed from groin     INTRAUTERINE DEVICE INSERTION  03/09/2017   Mirena    KNEE ARTHROCENTESIS  2006   MOUTH SURGERY     PILONIDAL CYST EXCISION  1999    Family History  Problem Relation Age of Onset   Lupus Mother    Cancer Father        Throid cancer   Diabetes Father    Hypertension Father    Hyperlipidemia Father    Anxiety disorder Father    Arthritis Father    Obesity Father    ADD / ADHD Brother    Asthma Brother    Obesity Brother    Alcohol abuse Maternal Grandfather    Heart disease Paternal Grandfather    Prostate cancer Paternal Grandfather        Prostate   Alcohol abuse Paternal Grandfather    Cancer Paternal Grandfather    Breast cancer Maternal Aunt    Cancer Maternal Aunt    Obesity Maternal Aunt    Colon cancer Neg Hx     Social:  reports that she has been smoking cigarettes. She has never used smokeless tobacco. She reports that she does not currently use alcohol. She reports that she does not use drugs.  Allergies:  Allergies  Allergen Reactions   Ciprofloxacin Hives   Clindamycin /Lincomycin Diarrhea   Doxycycline  Nausea And Vomiting   Metronidazole  Nausea And Vomiting   Penicillins Nausea And  Vomiting   Sulfa  Antibiotics Hives    Mouth ulcers   Tetracyclines & Related Nausea And Vomiting    ULCERS ON TONGUE    Medications: I have reviewed the patient's current medications.  Results for orders placed or performed during the hospital encounter of 12/30/23 (from the past 48 hours)  Pregnancy, urine POC     Status: None   Collection Time: 12/30/23  8:47 AM  Result Value Ref Range   Preg Test, Ur NEGATIVE NEGATIVE    Comment:        THE SENSITIVITY OF THIS METHODOLOGY IS >20 mIU/mL.     No results found.   PE Blood pressure (!) 139/102, pulse 87, temperature 98.4 F (36.9 C), temperature source Oral, resp. rate 16, height 5' 4 (1.626 m), weight 80.7  kg, SpO2 100%. Constitutional: NAD; conversant Eyes: Moist conjunctiva; no lid lag; anicteric Lungs: Normal respiratory effort CV: RRR Skin: Left groin hidradenitis - marked Psychiatric: Appropriate affect  Short stay RN Damien served as chaperone for exam/marking  Results for orders placed or performed during the hospital encounter of 12/30/23 (from the past 48 hours)  Pregnancy, urine POC     Status: None   Collection Time: 12/30/23  8:47 AM  Result Value Ref Range   Preg Test, Ur NEGATIVE NEGATIVE    Comment:        THE SENSITIVITY OF THIS METHODOLOGY IS >20 mIU/mL.     No results found.  A/P: [name] is an 43 y.o. female with hx of anxiety, adhd here for evaluation of mixed internal/external hemorrhoids  -[facility] referral was placed. We have given her a copy of this referral again today with phone numbers to call as it appears they have been try to get a hold of her.  - Will order left groin ultrasound to confirm this is in fact hidradenitis and nothing else.  - With regards to the hemorrhoids externally, her primary symptom is stool trapping. We discussed therefore the option of removal.  -The anatomy and physiology of the anal canal was discussed with the patient with associated pictures.  The pathophysiology of external hemorrhoids and additionally hidradenitis was discussed at length with associated pictures and illustrations. -We have reviewed options going forward including further observation vs surgery -hemorrhoidectomy, external (2 column); excision of hidradenitis, 4 x 4 cm of the left groin.  - We were also cautious in our discussions and that removing hemorrhoidal tags will help debulk them but she will always have some degree of residual skin tissue there. She understands this.  - With regards to the hidradenitis, I strongly recommended she quit smoking. We discussed nicotine is the factor in this equation that can increase her risk for infectious complications and recurrence. She acknowledges this. She anticipates and plans to quit smoking imminently.  -The planned procedures, material risks (including, but not limited to, pain, bleeding, infection, scarring, need for blood transfusion, damage to anal sphincter, incontinence of gas and/or stool, need for additional procedures, anal stenosis, rare cases of pelvic sepsis which in severe cases may require things like a colostomy, recurrence, pneumonia, heart attack, stroke, death) benefits and alternatives to surgery were discussed at length. I noted a good probability that the procedure would help improve their symptoms. The patient's questions were answered to her satisfaction, she voiced understanding and elected to proceed with surgery. Additionally, we discussed typical postoperative expectations and the recovery process.  -I have also recommended she continue to follow with dermatology at [facility] for long-term disease management of her hidradenitis   Lonni Pizza, MD Operating Room Services Surgery, [facility]

## 2023-12-30 NOTE — Anesthesia Preprocedure Evaluation (Addendum)
 Anesthesia Evaluation  Patient identified by MRN, date of birth, ID band Patient awake    Reviewed: Allergy  & Precautions, NPO status , Patient's Chart, lab work & pertinent test results  Airway Mallampati: II       Dental no notable dental hx. (+) Teeth Intact, Dental Advisory Given   Pulmonary asthma , sleep apnea , Recent URI , Resolved, Current Smoker   Pulmonary exam normal breath sounds clear to auscultation       Cardiovascular negative cardio ROS Normal cardiovascular exam Rhythm:Regular Rate:Normal     Neuro/Psych  Headaches PSYCHIATRIC DISORDERS Anxiety Depression    ADHD   GI/Hepatic Neg liver ROS,GERD  Medicated,,IBS Bright red blood per rectum   Internal and External hemorrhoids          Endo/Other  diabetes, Well Controlled, Type 2    Renal/GU Renal diseaseHx/o renal calculi  negative genitourinary   Musculoskeletal  (+) Arthritis , Osteoarthritis,  Hidradenitis inguinal   Abdominal   Peds  Hematology  (+) Blood dyscrasia, anemia   Anesthesia Other Findings   Reproductive/Obstetrics HSV                              Anesthesia Physical Anesthesia Plan  ASA: 3  Anesthesia Plan: General   Post-op Pain Management: Minimal or no pain anticipated   Induction: Intravenous  PONV Risk Score and Plan: Treatment may vary due to age or medical condition, Ondansetron  and Dexamethasone   Airway Management Planned: Oral ETT  Additional Equipment: None  Intra-op Plan:   Post-operative Plan: Extubation in OR  Informed Consent: I have reviewed the patients History and Physical, chart, labs and discussed the procedure including the risks, benefits and alternatives for the proposed anesthesia with the patient or authorized representative who has indicated his/her understanding and acceptance.     Dental advisory given  Plan Discussed with: Anesthesiologist and  CRNA  Anesthesia Plan Comments:          Anesthesia Quick Evaluation

## 2023-12-31 ENCOUNTER — Encounter (HOSPITAL_COMMUNITY): Payer: Self-pay | Admitting: Surgery

## 2024-01-01 ENCOUNTER — Ambulatory Visit: Payer: Self-pay | Admitting: Surgery

## 2024-01-01 LAB — SURGICAL PATHOLOGY

## 2024-01-01 NOTE — Telephone Encounter (Signed)
Left message for patient to check her my chart message

## 2024-01-05 ENCOUNTER — Telehealth: Payer: Self-pay

## 2024-01-05 NOTE — Telephone Encounter (Signed)
 No Paxlovid at this point. Treatment only indicated if she has symptoms , tests (+) and only in patients who are high risk. Recommend urgent care if she has symptoms.

## 2024-01-05 NOTE — Telephone Encounter (Signed)
 Copied from CRM #[id number]. Topic: Clinical - Medication Question >> Jan 05, 2024 12:30 PM Mia F wrote: Reason for CRM: Pt is asking if she could get a rx sent for Paxlovid. She was exposed to CVOID and she is going on a trip to [place] tomorrow and she is concerned with there immune system issues that she may contract the virus. Please send to  [facility] Address: [address] [place], [place] [address] Phone: [phone]   Pharmacy closes at 6pm

## 2024-01-05 NOTE — Telephone Encounter (Signed)
LMOM informing Pt of PCP recommendations.  

## 2024-01-08 ENCOUNTER — Encounter: Payer: Self-pay | Admitting: Internal Medicine

## 2024-01-08 ENCOUNTER — Ambulatory Visit (INDEPENDENT_AMBULATORY_CARE_PROVIDER_SITE_OTHER): Admitting: Internal Medicine

## 2024-01-08 VITALS — BP 138/86 | HR 71 | Temp 98.1°F | Resp 16 | Ht 64.25 in | Wt 176.0 lb

## 2024-01-08 DIAGNOSIS — D75839 Thrombocytosis, unspecified: Secondary | ICD-10-CM | POA: Diagnosis not present

## 2024-01-08 DIAGNOSIS — E119 Type 2 diabetes mellitus without complications: Secondary | ICD-10-CM

## 2024-01-08 DIAGNOSIS — G4730 Sleep apnea, unspecified: Secondary | ICD-10-CM

## 2024-01-08 NOTE — Progress Notes (Signed)
 Subjective:    Patient ID: [name], female    DOB: 1980-05-08, 43 y.o.   MRN: [id number]  DOS:  01/08/2024 Type of visit - description:   History of Present Illness [name] is a 43 year old female for f/u  Postoperative pain and drainage - Underwent surgery nine days ago for removal of hemorrhoids   Sleep apnea - Diagnosed with sleep apnea. - Referred to neurology but has not attended the appointment.  Thrombocytosis and anemia - Platelet count has been elevated since at least 2012. - Platelet count remains high    Diabetes mellitus - most recent hemoglobin A1c of 6.1%.    Wt Readings from Last 3 Encounters:  01/08/24 176 lb (79.8 kg)  12/30/23 177 lb 14.6 oz (80.7 kg)  12/22/23 178 lb (80.7 kg)     Review of Systems See above   Past Medical History:  Diagnosis Date   ADHD    Allergy     Anemia, iron deficiency    Anxiety    Arthritis    Asthma    Environmental, havent had in years since i moved to [facility]   CIN III (cervical intraepithelial neoplasia grade III) with severe dysplasia 11/2008   LEEP-D&C-HYSTEROSCOPY-SHOWED CIN II AND CIN III   Depression    DUB (dysfunctional uterine bleeding)    GERD (gastroesophageal reflux disease)    If i take antibiotics   Herpes simplex type 1 infection 11/2020   History of kidney stones    Iron deficiency anemia secondary to blood loss (chronic) 07/29/2012   Kidney stones    Migraines    Recurrent upper respiratory infection (URI)    Sleep apnea    Type 2 diabetes mellitus (HCC)     Past Surgical History:  Procedure Laterality Date   ANTERIOR CRUCIATE LIGAMENT REPAIR  1998   LEFT   CERVICAL BIOPSY  W/ LOOP ELECTRODE EXCISION  11/2008   CIN 3 free margins   COLPOSCOPY     Cyst removed from groin     HEMORRHOID SURGERY N/A 12/30/2023   Procedure: HEMORRHOIDECTOMY;  Surgeon: Teresa Lonni HERO, MD;  Location: [facility];  Service: General;  Laterality: N/A;  HEMORRHOIDECTOM EXTERNAL 2  COLUMN EXCISION OF HIRRADENITIS LEFT INGUINAL   HYDRADENITIS EXCISION Left 12/30/2023   Procedure: EXCISION, HIDRADENITIS, INGUINAL REGION;  Surgeon: Teresa Lonni HERO, MD;  Location: [facility];  Service: General;  Laterality: Left;   INTRAUTERINE DEVICE INSERTION  03/09/2017   Mirena    KNEE ARTHROCENTESIS  2006   MOUTH SURGERY     PILONIDAL CYST EXCISION  1999    Current Outpatient Medications  Medication Instructions   amphetamine-dextroamphetamine (ADDERALL) 20 MG tablet 20 mg, 3 times daily   Boric Acid Vaginal 600 MG SUPP 1 ampule, Vaginal, At bedtime PRN, Use after intercourse.  Can also use for 7 days if she feels a BV infection   clonazePAM (KLONOPIN) 0.5 mg, At bedtime PRN   cyclobenzaprine (FLEXERIL) 10 mg, 3 times daily PRN   fluconazole  (DIFLUCAN ) 150 mg, Oral, Every 3 DAYS   levonorgestrel  (MIRENA ) 20 MCG/24HR IUD 1 each,  Once   metroNIDAZOLE  (METROGEL ) 0.75 % gel 1 Application, As needed   ondansetron  (ZOFRAN ) 8 mg, Oral, 2 times daily PRN   oxyCODONE  (OXY IR/ROXICODONE ) 5 mg, Every 6 hours PRN   valACYclovir  (VALTREX ) 1000 MG tablet TAKE 1 TABLET(1000 MG) BY MOUTH TWICE DAILY       Objective:   Physical Exam BP 138/86   Pulse 71  Temp 98.1 F (36.7 C) (Oral)   Resp 16   Ht 5' 4.25 (1.632 m)   Wt 176 lb (79.8 kg)   SpO2 97%   BMI 29.98 kg/m  General:   Well developed, NAD, BMI noted. HEENT:  Normocephalic . Face symmetric, atraumatic Lungs:  CTA B Normal respiratory effort, no intercostal retractions, no accessory muscle use. Heart: RRR,  no murmur.  Lower extremities: no pretibial edema bilaterally  Skin: Not pale. Not jaundice Neurologic:  alert & oriented X3.  Speech normal, gait appropriate for age and unassisted Psych--  Cognition and judgment appear intact.  Cooperative with normal attention span and concentration.  Behavior appropriate. No anxious or depressed appearing.      Assessment     Assessment (new patient 03/25/2021,  transferred from another office) DM A1c 6.7  (03-2021) Anxiety; ADD  (see Dr Vincente) CIN III OSA: dx ~ 2019, failed a brief trial with a CPAP, not using as off 08/2023 Hydradenitis suppurativa.  Sees dermatology Hemorrhoids: Saw general surgery 07/06/2023 H/o urolithiasis  Birth control: IUD   PLAN Assessment & Plan Hemorrhoidectomy, hidradenitis suppurativa and skin tags removal. Post-surgical pain and drainage from the surgical site.  Rec to discuss with surgery Type 2 diabetes mellitus Improved glycemic control with HbA1c at 6.1%.  Has lost weight on her own scales. Last  A1c very good.  No change Thrombocytosis, leukocytosis Chronic thrombocytosis, last seen by hematology around 2014.  Workup at that time show a negative von Willebrand disease test and a negative JAK2 mutation.  The problem is ongoing, will ask hematology to reevaluate. Obstructive sleep apnea (suspected) Suspected obstructive sleep apnea with previous neurology referral not followed up. - Re-refer to neurology for evaluation.  Encouraged to call them Seen by gynecology earlier this month, had dyspareunia, diagnosed with Ureaplasma, treated Vaccine advised: Flu and COVID booster.  Declined, encouraged to proceed at the pharmacy RTC 5 months 2026 CPX

## 2024-01-08 NOTE — Patient Instructions (Addendum)
 We are referring you to neurology for sleep apnea.  We are referring you to hematology for the increased platelet count  Vaccines I recommend: Flu shot, COVID booster  Before you leave make an appointment for a physical exam by 08/2024    YOUR PLAN: TYPE 2 DIABETES MELLITUS: Your blood sugar control has improved, with your HbA1c at 6.1%. You have also lost some weight since your diagnosis, although there has been a slight increase in weight recently due to post-surgical inactivity. -Continue with your current diabetes management plan. -Monitor your weight and blood sugar levels regularly.  SECONDARY THROMBOCYTOSIS: You have a history of elevated platelet count and secondary anemia. -We will refer you to a hematologist for a re-evaluation of your thrombocytosis.  OBSTRUCTIVE SLEEP APNEA (SUSPECTED): You have been diagnosed with sleep apnea but have not yet attended your neurology appointment for further evaluation. -We will re-refer you to neurology for an evaluation of your sleep apnea.

## 2024-01-09 NOTE — Assessment & Plan Note (Signed)
 Hemorrhoidectomy, hidradenitis suppurativa and skin tags removal. Post-surgical pain and drainage from the surgical site.  Rec to discuss with surgery Type 2 diabetes mellitus Improved glycemic control with HbA1c at 6.1%.  Has lost weight on her own scales. Last  A1c very good.  No change Thrombocytosis, leukocytosis Chronic thrombocytosis, last seen by hematology around 2014.  Workup at that time show a negative von Willebrand disease test and a negative JAK2 mutation.  The problem is ongoing, will ask hematology to reevaluate. Obstructive sleep apnea (suspected) Suspected obstructive sleep apnea with previous neurology referral not followed up. - Re-refer to neurology for evaluation.  Encouraged to call them Seen by gynecology earlier this month, had dyspareunia, diagnosed with Ureaplasma, treated Vaccine advised: Flu and COVID booster.  Declined, encouraged to proceed at the pharmacy RTC 5 months 2026 CPX

## 2024-01-12 NOTE — Progress Notes (Signed)
 REFERRING PHYSICIAN:  Amon Aloysius BRAVO, MD  PROVIDER:  LONNI OZELL PIZZA, MD  MRN: [id number] DOB: 1980-09-17 DATE OF ENCOUNTER: 01/12/2024  Subjective   Chief Complaint: Hemorrhoids   History of Present Illness: [name] is a 43 y.o. female with history of nephrolithiasis, whom is seen in the office today as a referral by Dr. Amon for evaluation of hemorrhoids.    She reports that she has had symptoms of hemorrhoidal prolapse dating back to when she was 43 years of age.  She reports at the age of 44 she did have significant constipation.  She reported a lot of straining.  She reports that she was told by her pediatrician that she had some hemorrhoids.  She has dealt with them since that time.  She reports that she is not currently taking any stool softeners, laxatives, or fiber supplements.  She drinks virtually no water per day.  She spends 30+ minutes on the commode with bowel movements.  She reports that she has on average variable consistency bowel movement daily.  She does report occasional bright red blood per rectum, on the toilet paper but not admixed with the stool.  INTERVAL HX She returns today for follow-up.   She brings up to issues on today's visit- 1.  Hidradenitis of the left groin 2.  Perianal external hemorrhoidal tags  With regards to the external hemorrhoids, she reports that she has now been consistently taking a fiber supplement-Metamucil and that her stools are consistently soft.  She spends no more than 5 minutes on the commode.  Denies any straining.  Reports no bright red blood per rectum since we saw her in March.  Denies any anorectal pain with bowel movements.  She denies any tissue prolapse.  She states that the only issue that she is experiencing now is stool trapping around the external hemorrhoidal tissue which requires her to wipe in every direction and even then still has some stool trapping.  Denies any anorectal pain.  With regards to the  hidradenitis, she reports that she has a longstanding history of this.  Her only active area of disease is in her left groin.  She has noticed some swelling there for the last couple of months.  She has been following with dermatology at [facility]-Dr. Paulita.  He currently has her on suppressive antibiotics.  Her symptoms of discomfort and swelling in this location have persisted now for many weeks despite this.  She states that if she comes off this antibiotic it will swell even more.  She has no active drainage at present.  She has been undergoing laser treatments numerous treatments of that to bilateral medial thighs, mons pubis, vulva, labia.  Prior hidradenitis excision by Dr. Vernetta 10/20/2017 at surgical center in [place].  He did excise a chronically inflamed left groin cyst.  This measured 2 cm.  Also reports history of abdominal hernia noted on CT in 2019 and points to her RUQ. She has CT showing fat containing umbilical hernia. No symptoms.  INTERVAL HX OR 12/30/23 Excision of left groin hidradenitis - 5 x 2 cm Hemorrhoidectomy - 2 column Anorectal exam under anesthesia  OR FINDINGS: Subtle scarring/chronic hidradenitis in the left groin.  No active inflammation/erythema or purulent fluid.  All visible disease was excised.  2 discrete hemorrhoidal bundles primarily of external hemorrhoidal tissue in the anterior midline and posterior midline.  These were also excised.   A. HIDRADENITIS, LEFT GROIN, EXCISION:  - Benign skin with dermal fibrosis, compatible  with healed hidradenitis   B. HEMORRHOID, POSTERIOR MIDLINE, HEMORRHOIDECTOMY:  - Hemorrhoidal tissue   C. HEMORRHOID, ANTERIOR MIDLINE, HEMORRHOIDECTOMY:  - Hemorrhoidal tissue   Returns today for follow-up.  Has been applying Medihoney to the surgical sites.  Hidradenitis excision site opened partially some.  Otherwise is doing well.  Working to keep her stool soft.  Denies any nausea, vomiting, abdominal pain.  Denies any purulent  drainage from any of her sites.  She does report a potential exposed suture tail in the perianal area.   PMH: HGSIL cervix; anxiety (prn clonazapam), adhd (takes adderall 20 mg daily), takes boric acid daily  PSH: ACL tear-1989; arthrocentesis (jaw, 2003); wisdom teeth 1997; pilonidal cyst removal, 2001; sebaceous cyst removal, 2019  FHx: Mother has lupus; denies any known family history of colorectal, breast, endometrial or ovarian cancer  Social Hx: +tobacco use 1/2 ppd; 1-2 drinks of EtOH per week; marijuana OTC Gummies occasionally.  She returns here today by herself.   Medical History: Past Medical History:  Diagnosis Date  . Anemia   . Anxiety   . Arthritis   . Asthma, unspecified asthma severity, unspecified whether complicated, unspecified whether persistent (HHS-HCC)   . Diabetes mellitus without complication (CMS/HHS-HCC)   . Sleep apnea     There is no problem list on file for this patient.   Past Surgical History:  Procedure Laterality Date  . wisdom teeth  1997  . ACL Tear  1999  . Pionidal Cyst Removal  2001  . Sebaceous Cyst  2019     Allergies  Allergen Reactions  . Ciprofloxacin Hives and Other (See Comments)    TENDONITIS  . Sulfa  (Sulfonamide Antibiotics) Hives and Other (See Comments)    Mouth ulcers  . Clindamycin  Diarrhea  . Other Unknown    Mycin drugs-Nausea  . Metronidazole  Other (See Comments) and Nausea And Vomiting  . Penicillins Nausea And Vomiting  . Tetracyclines Other (See Comments) and Nausea And Vomiting    ULCERS ON TONGUE    Current Outpatient Medications on File Prior to Visit  Medication Sig Dispense Refill  . boric acid 600 mg vaginal suppository     . clonazePAM (KLONOPIN) 0.5 MG tablet Take 0.5 mg by mouth    . dextroamphetamine-amphetamine (ADDERALL) 20 mg tablet Take 20 mg by mouth    . oxyCODONE  (ROXICODONE ) 5 MG immediate release tablet Take 1 tablet (5 mg total) by mouth every 6 (six) hours as needed for Pain 15  tablet 0  . propranoloL (INDERAL) 20 MG tablet Take 20 mg by mouth    . valACYclovir  (VALTREX ) 1000 MG tablet      No current facility-administered medications on file prior to visit.    Family History  Problem Relation Age of Onset  . Obesity Mother   . Skin cancer Father   . Obesity Father   . High blood pressure (Hypertension) Father   . Coronary Artery Disease (Blocked arteries around heart) Father   . Diabetes Father   . Obesity Brother      Social History   Tobacco Use  Smoking Status Every Day  . Types: Cigarettes  Smokeless Tobacco Never     Social History   Socioeconomic History  . Marital status: Single  Tobacco Use  . Smoking status: Every Day    Types: Cigarettes  . Smokeless tobacco: Never  Vaping Use  . Vaping status: Never Used  Substance and Sexual Activity  . Alcohol use: Yes  . Drug use: Yes  Social Drivers of Corporate investment banker Strain: Low Risk  (08/13/2023)   Received from [facility]   Overall Financial Resource Strain (CARDIA)   . Difficulty of Paying Living Expenses: Not very hard  Food Insecurity: No Food Insecurity (08/13/2023)   Received from [facility]   Hunger Vital Sign   . Within the past 12 months, you worried that your food would run out before you got the money to buy more.: Never true   . Within the past 12 months, the food you bought just didn't last and you didn't have money to get more.: Never true  Transportation Needs: No Transportation Needs (08/13/2023)   Received from [facility] - Transportation   . Lack of Transportation (Medical): No   . Lack of Transportation (Non-Medical): No  Physical Activity: Insufficiently Active (08/13/2023)   Received from [facility]   Exercise Vital Sign   . On average, how many days per week do you engage in moderate to strenuous exercise (like a brisk walk)?: 2 days   . On average, how many minutes do you engage in exercise at this level?: 60 min  Stress: No Stress  Concern Present (08/13/2023)   Received from [facility] - Occupational Stress Questionnaire   . Feeling of Stress : Only a little  Social Connections: Moderately Integrated (08/13/2023)   Received from [facility]   Social Connection and Isolation Panel   . In a typical week, how many times do you talk on the phone with family, friends, or neighbors?: More than three times a week   . How often do you get together with friends or relatives?: Three times a week   . How often do you attend church or religious services?: Never   . Do you belong to any clubs or organizations such as church groups, unions, fraternal or athletic groups, or school groups?: Yes   . How often do you attend meetings of the clubs or organizations you belong to?: More than 4 times per year   . Are you married, widowed, divorced, separated, never married, or living with a partner?: Living with partner  Housing Stability: Unknown (07/06/2023)   Housing Stability Vital Sign   . Homeless in the Last Year: No    Objective:    Vitals:   01/12/24 0929 01/12/24 0930  Pulse: 110   Resp: 16   Temp: 36.8 C (98.3 F)   SpO2: 97%   Weight: 80 kg (176 lb 6.4 oz)   Height: 162.6 cm (5' 4)   PainSc:    3     Body mass index is 30.28 kg/m.  Constitutional: NAD; conversant Eyes: Moist conjunctiva; anicteric Lungs: Normal respiratory effort Skin: Left groin with healing nicely.  There is no erythema.  No purulent drainage.  No fluctuance.  There are couple small areas with a couple millimeters of skin separation.  This all looks very well at this juncture. Anorectal: Normal perianal skin.  One exposed tail of Vicryl suture was trimmed.  No large open wounds. Psychiatric: Appropriate affect  A chaperone, Chemira Jones CMA, was present for this encounter and examination  Assessment and Plan:  Diagnoses and all orders for this visit:  Internal hemorrhoid  External  hemorrhoids  Hidradenitis suppurativa    Marsella Suman is a very pleasant 43 y.o. female with hx of anxiety, adhd here for evaluation of mixed internal/external hemorrhoids  -[facility] referral was placed.  We have given her a copy of this referral again today with phone numbers to call as it appears they have been try to get a hold of her.   -She inquired about sexual intercourse.  We discussed waiting until she is well-healed from all this and at least a month or two out.  -Doing well.  Wounds healing appropriately without any evidence of infection.  Small areas of skin separation at the hidradenitis excision site are not uncommon and we reviewed this with her.  We will tentatively plan to see her back for follow-up in a month for recheck or sooner if any other issues or concerns arise.  She has been encouraged to call with any questions or concerns.  All of her questions were answered today and she expressed understanding and agreement with the plan.  -I have also recommended she continue to follow with dermatology at [facility] for long-term disease management of her hidradenitis   Return in about 1 month (around 02/11/2024).  I spent a total of 20 minutes in both face-to-face and non-face-to-face activities, excluding procedures performed, for this visit on the date of this encounter.   Lonni Pizza, MD [facility]

## 2024-01-19 NOTE — Telephone Encounter (Signed)
 Call placed to patient, left message advising patient to return call to [phone], opt 1 to schedule; opt 4 if any additional questions.

## 2024-01-20 NOTE — Telephone Encounter (Signed)
 Per review of EPIC, OV scheduled for 01/22/24.   Routing FYI  Encounter closed.

## 2024-01-22 ENCOUNTER — Encounter: Payer: Self-pay | Admitting: Radiology

## 2024-01-22 ENCOUNTER — Ambulatory Visit: Admitting: Radiology

## 2024-01-22 ENCOUNTER — Other Ambulatory Visit: Payer: Self-pay | Admitting: Nurse Practitioner

## 2024-01-22 VITALS — BP 122/80 | HR 99 | Wt 172.6 lb

## 2024-01-22 DIAGNOSIS — F331 Major depressive disorder, recurrent, moderate: Secondary | ICD-10-CM | POA: Diagnosis not present

## 2024-01-22 DIAGNOSIS — F411 Generalized anxiety disorder: Secondary | ICD-10-CM | POA: Diagnosis not present

## 2024-01-22 DIAGNOSIS — A493 Mycoplasma infection, unspecified site: Secondary | ICD-10-CM

## 2024-01-22 DIAGNOSIS — F410 Panic disorder [episodic paroxysmal anxiety] without agoraphobia: Secondary | ICD-10-CM | POA: Diagnosis not present

## 2024-01-22 DIAGNOSIS — N760 Acute vaginitis: Secondary | ICD-10-CM | POA: Diagnosis not present

## 2024-01-22 LAB — WET PREP FOR TRICH, YEAST, CLUE

## 2024-01-22 NOTE — Telephone Encounter (Signed)
 Med refill request: ZOFRAN   Last AEX: 04/01/21 next OV 01/22/24 Next AEX: not scheduled message sent to FD  Last MMG (if hormonal med) Refill authorized: last rx 12/29/23 #20 with 0 refills. Please approve or deny

## 2024-01-22 NOTE — Progress Notes (Signed)
      Subjective: [name] is a 43 y.o. female who complains of vaginal discharge and itching. Treated for mycoplasma with doxy 12/29/23. Took 1 diflucan  4 days ago and 1 applicatorful of metrogel  5 days ago.   Review of Systems  All other systems reviewed and are negative.   Past Medical History:  Diagnosis Date   ADHD    Allergy     Anemia, iron deficiency    Anxiety    Arthritis    Asthma    Environmental, havent had in years since i moved to [facility]   CIN III (cervical intraepithelial neoplasia grade III) with severe dysplasia 11/2008   LEEP-D&C-HYSTEROSCOPY-SHOWED CIN II AND CIN III   Depression    DUB (dysfunctional uterine bleeding)    GERD (gastroesophageal reflux disease)    If i take antibiotics   Herpes simplex type 1 infection 11/2020   History of kidney stones    Iron deficiency anemia secondary to blood loss (chronic) 07/29/2012   Kidney stones    Migraines    Recurrent upper respiratory infection (URI)    Sleep apnea    Type 2 diabetes mellitus (HCC)       Objective:  Today's Vitals   01/22/24 1227  BP: 122/80  Pulse: 99  SpO2: 98%  Weight: 172 lb 9.6 oz (78.3 kg)   Body mass index is 29.4 kg/m.   Physical Exam Vitals and nursing note reviewed. Exam conducted with a chaperone present.  Constitutional:      Appearance: Normal appearance. She is well-developed.  Pulmonary:     Effort: Pulmonary effort is normal.  Abdominal:     General: Abdomen is flat.     Palpations: Abdomen is soft.  Genitourinary:    General: Normal vulva.     Vagina: Vaginal discharge present. No erythema, bleeding or lesions.     Cervix: Normal. No discharge, friability, lesion or erythema.     Uterus: Normal.      Adnexa: Right adnexa normal and left adnexa normal.  Neurological:     Mental Status: She is alert.  Psychiatric:        Mood and Affect: Mood normal.        Thought Content: Thought content normal.        Judgment: Judgment normal.     Microscopic wet-mount exam shows negative for pathogens, normal epithelial cells.   Geni Pica, CMA present for exam  Assessment:/Plan:   1. Recurrent vaginitis (Primary) - WET PREP FOR TRICH, YEAST, CLUE Reassured negative. Schedule TOC 4 weeks for myco/ureaplasma  Will contact patient with results of testing completed today. Avoid intercourse until symptoms are resolved. Safe sex encouraged. Avoid the use of soaps or perfumed products in the peri area. Avoid tub baths and sitting in sweaty or wet clothing for prolonged periods of time.     Donzell Coller B, NP 12:35 PM

## 2024-01-26 NOTE — Telephone Encounter (Signed)
 OV 01/22/24. Wet prep negative. No meds sent.   Routing to [place] to review request and advise.

## 2024-01-26 NOTE — Telephone Encounter (Signed)
 What symptoms is she experiencing? Wet prep was negative. I would recommend baking soda soaks. 1/2cup baking soda in a half full warm tub.

## 2024-01-27 NOTE — Telephone Encounter (Signed)
 Call placed to patient, left detailed message, ok per dpr. Advised per Jami. Return call to advise how you would like to proceed, [phone], opt 4, or reply via MyChart.

## 2024-01-27 NOTE — Telephone Encounter (Signed)
 I would recommend OV. We don't typically offer self swab but I am fine with that if she wants to drop that off if she can't come for a visit.

## 2024-01-28 NOTE — Telephone Encounter (Signed)
 Patient left message on triage 01/27/24 at 1617, states she is unable to schedule OV, would only be able to make it in office after 4pm today or tomorrow.   Reviewed with Jami, can come in to leave a self sureswab this one time. But will need to be here before 4:30.   Call placed to patient, left detailed message, ok per dpr. Advised per Jami. Return call to advise how you want to proceed.

## 2024-01-29 ENCOUNTER — Other Ambulatory Visit

## 2024-01-29 ENCOUNTER — Other Ambulatory Visit: Payer: Self-pay | Admitting: Radiology

## 2024-01-29 DIAGNOSIS — N760 Acute vaginitis: Secondary | ICD-10-CM

## 2024-01-29 DIAGNOSIS — F410 Panic disorder [episodic paroxysmal anxiety] without agoraphobia: Secondary | ICD-10-CM | POA: Diagnosis not present

## 2024-01-29 DIAGNOSIS — F331 Major depressive disorder, recurrent, moderate: Secondary | ICD-10-CM | POA: Diagnosis not present

## 2024-01-29 DIAGNOSIS — F411 Generalized anxiety disorder: Secondary | ICD-10-CM | POA: Diagnosis not present

## 2024-01-29 NOTE — Telephone Encounter (Signed)
 Jami -please place order

## 2024-01-30 LAB — SURESWAB® ADVANCED VAGINITIS,TMA
CANDIDA SPECIES: NOT DETECTED
Candida glabrata: NOT DETECTED
SURESWAB(R) ADV BACTERIAL VAGINOSIS(BV),TMA: POSITIVE — AB
TRICHOMONAS VAGINALIS (TV),TMA: NOT DETECTED

## 2024-02-01 ENCOUNTER — Ambulatory Visit: Payer: Self-pay | Admitting: Radiology

## 2024-02-01 MED ORDER — METRONIDAZOLE 0.75 % VA GEL: 1.0000 | Freq: Every day | VAGINAL | 0 refills | Status: AC

## 2024-02-11 ENCOUNTER — Telehealth: Payer: Self-pay

## 2024-02-11 NOTE — Telephone Encounter (Signed)
 She can keep appt tomorrow, will treat at visit.

## 2024-02-11 NOTE — Telephone Encounter (Signed)
 Patient was treat for mycoplasma & is scheduled to come tomorrow for recheck. Patient was called & explained to her that she also needed to be treated with moxifloxacin however she was only given doxycyline due to allergy  to cipro. Patient states she did not have hives from cipro. She said she had to be on it for months in the past and she developed tendonitis of the achilles tendon. She said it eventually went away. She said she could try the medication now if she thinks she should.  She also stated that she is not sure if the last infection ever went away from the yeast or bv or if it is just the gel that is coming out that is white & clumpy. She is not having any itching, irritation or odor. She stated she is not opposed to still coming tomorrow for a vaginal swab to see if everything is gone away. Patient states [place] and doesn't want to be on medication incase she has a reaction. Patient states she will do whatever you think is best. Please advise.

## 2024-02-11 NOTE — Telephone Encounter (Signed)
 Patient notified

## 2024-02-12 ENCOUNTER — Encounter: Payer: Self-pay | Admitting: Radiology

## 2024-02-12 ENCOUNTER — Ambulatory Visit (INDEPENDENT_AMBULATORY_CARE_PROVIDER_SITE_OTHER): Admitting: Radiology

## 2024-02-12 VITALS — BP 132/76 | HR 88 | Wt 173.0 lb

## 2024-02-12 DIAGNOSIS — A493 Mycoplasma infection, unspecified site: Secondary | ICD-10-CM | POA: Diagnosis not present

## 2024-02-12 DIAGNOSIS — N760 Acute vaginitis: Secondary | ICD-10-CM

## 2024-02-12 DIAGNOSIS — F410 Panic disorder [episodic paroxysmal anxiety] without agoraphobia: Secondary | ICD-10-CM | POA: Diagnosis not present

## 2024-02-12 DIAGNOSIS — F411 Generalized anxiety disorder: Secondary | ICD-10-CM | POA: Diagnosis not present

## 2024-02-12 DIAGNOSIS — F331 Major depressive disorder, recurrent, moderate: Secondary | ICD-10-CM | POA: Diagnosis not present

## 2024-02-12 LAB — WET PREP FOR TRICH, YEAST, CLUE

## 2024-02-12 MED ORDER — FLUCONAZOLE 150 MG PO TABS: 150.0000 mg | ORAL_TABLET | ORAL | 0 refills | Status: AC

## 2024-02-12 MED ORDER — MOXIFLOXACIN HCL 400 MG PO TABS: 400.0000 mg | ORAL_TABLET | Freq: Every day | ORAL | 0 refills | Status: AC

## 2024-02-12 NOTE — Progress Notes (Signed)
      Subjective: [name] is a 43 y.o. female who complains of recurrent vaginitis. Treated for myco/ureaplasma with doxy but not the second course of moxifloxacin, will treat that today.    Review of Systems  All other systems reviewed and are negative.   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   Physical Exam Vitals and nursing note reviewed. Exam conducted with a chaperone present.  Constitutional:      Appearance: Normal appearance. She is well-developed.  Pulmonary:     Effort: Pulmonary effort is normal.  Abdominal:     General: Abdomen is flat.     Palpations: Abdomen is soft.  Genitourinary:    General: Normal vulva.     Vagina: Vaginal discharge present. No erythema, bleeding or lesions.     Cervix: Normal. No discharge, friability, lesion or erythema.     Uterus: Normal.      Adnexa: Right adnexa normal and left adnexa normal.  Neurological:     Mental Status: She is alert.  Psychiatric:        Mood and Affect: Mood normal.        Thought Content: Thought content normal.        Judgment: Judgment normal.      Microscopic wet-mount exam shows  hyphae.   Heinz Senters, CMA present for exam  Assessment:/Plan:  1. Recurrent vaginitis (Primary) + yeast Begin a daily pro/prebiotic. Twice daily x 1 week then just once a day. Baking soda baths once a week. Boric acid 600mg  once weekly in the vagina at bedtime - WET PREP FOR TRICH, YEAST, CLUE - fluconazole  (DIFLUCAN ) 150 MG tablet; Take 1 tablet (150 mg total) by mouth every 3 (three) days.  Dispense: 3 tablet; Refill: 0  2. Mycoplasma infection - moxifloxacin (AVELOX) 400 MG tablet; Take 1 tablet (400 mg total) by mouth daily at 8 pm.  Dispense: 7 tablet; Refill: 0   Avoid intercourse until symptoms are resolved. Safe sex encouraged. Avoid the use of soaps or perfumed products in the peri area. Avoid tub baths and sitting in sweaty or wet clothing for prolonged periods of time.    Lacye Mccarn B, NP 9:09 AM

## 2024-02-19 ENCOUNTER — Inpatient Hospital Stay

## 2024-02-19 ENCOUNTER — Encounter: Admitting: Hematology & Oncology

## 2024-02-26 ENCOUNTER — Encounter: Admitting: Hematology & Oncology

## 2024-02-26 ENCOUNTER — Inpatient Hospital Stay

## 2024-03-18 DIAGNOSIS — F411 Generalized anxiety disorder: Secondary | ICD-10-CM | POA: Diagnosis not present

## 2024-03-18 DIAGNOSIS — F410 Panic disorder [episodic paroxysmal anxiety] without agoraphobia: Secondary | ICD-10-CM | POA: Diagnosis not present

## 2024-03-18 DIAGNOSIS — F331 Major depressive disorder, recurrent, moderate: Secondary | ICD-10-CM | POA: Diagnosis not present

## 2024-03-18 DIAGNOSIS — F900 Attention-deficit hyperactivity disorder, predominantly inattentive type: Secondary | ICD-10-CM | POA: Diagnosis not present

## 2024-03-24 ENCOUNTER — Institutional Professional Consult (permissible substitution): Admitting: Neurology

## 2024-03-25 ENCOUNTER — Inpatient Hospital Stay: Admitting: Hematology & Oncology

## 2024-03-25 ENCOUNTER — Inpatient Hospital Stay

## 2024-04-12 ENCOUNTER — Ambulatory Visit: Admitting: Nurse Practitioner

## 2024-04-15 ENCOUNTER — Inpatient Hospital Stay

## 2024-04-15 ENCOUNTER — Inpatient Hospital Stay: Admitting: Hematology & Oncology

## 2024-08-19 ENCOUNTER — Encounter: Admitting: Internal Medicine
